# Patient Record
Sex: Male | Born: 1980 | Race: White | Hispanic: No | Smoking: Current every day smoker
Health system: Southern US, Community
[De-identification: ages and names within clinical notes are randomized; demographics above are authoritative.]

## PROBLEM LIST (undated history)

## (undated) DIAGNOSIS — K287 Chronic gastrojejunal ulcer without hemorrhage or perforation: Secondary | ICD-10-CM

## (undated) DIAGNOSIS — J45909 Unspecified asthma, uncomplicated: Secondary | ICD-10-CM

## (undated) DIAGNOSIS — K289 Gastrojejunal ulcer, unspecified as acute or chronic, without hemorrhage or perforation: Secondary | ICD-10-CM

## (undated) DIAGNOSIS — K219 Gastro-esophageal reflux disease without esophagitis: Secondary | ICD-10-CM

## (undated) DIAGNOSIS — F32A Depression, unspecified: Secondary | ICD-10-CM

## (undated) DIAGNOSIS — I8390 Asymptomatic varicose veins of unspecified lower extremity: Secondary | ICD-10-CM

## (undated) DIAGNOSIS — F319 Bipolar disorder, unspecified: Secondary | ICD-10-CM

## (undated) DIAGNOSIS — Z9884 Bariatric surgery status: Secondary | ICD-10-CM

## (undated) DIAGNOSIS — Z87442 Personal history of urinary calculi: Secondary | ICD-10-CM

## (undated) DIAGNOSIS — N200 Calculus of kidney: Secondary | ICD-10-CM

## (undated) DIAGNOSIS — F329 Major depressive disorder, single episode, unspecified: Secondary | ICD-10-CM

## (undated) DIAGNOSIS — I10 Essential (primary) hypertension: Secondary | ICD-10-CM

## (undated) DIAGNOSIS — D649 Anemia, unspecified: Secondary | ICD-10-CM

## (undated) DIAGNOSIS — K283 Acute gastrojejunal ulcer without hemorrhage or perforation: Secondary | ICD-10-CM

## (undated) DIAGNOSIS — T7840XA Allergy, unspecified, initial encounter: Secondary | ICD-10-CM

## (undated) HISTORY — DX: Gastrojejunal ulcer, unspecified as acute or chronic, without hemorrhage or perforation: K28.9

## (undated) HISTORY — DX: Essential (primary) hypertension: I10

## (undated) HISTORY — DX: Depression, unspecified: F32.A

## (undated) HISTORY — DX: Chronic gastrojejunal ulcer without hemorrhage or perforation: K28.7

## (undated) HISTORY — PX: TONSILLECTOMY: SUR1361

## (undated) HISTORY — DX: Bipolar disorder, unspecified: F31.9

## (undated) HISTORY — DX: Allergy, unspecified, initial encounter: T78.40XA

## (undated) HISTORY — DX: Unspecified asthma, uncomplicated: J45.909

## (undated) HISTORY — DX: Calculus of kidney: N20.0

## (undated) HISTORY — DX: Anemia, unspecified: D64.9

## (undated) HISTORY — DX: Asymptomatic varicose veins of unspecified lower extremity: I83.90

## (undated) HISTORY — DX: Acute gastrojejunal ulcer without hemorrhage or perforation: K28.3

## (undated) HISTORY — DX: Major depressive disorder, single episode, unspecified: F32.9

## (undated) HISTORY — PX: WISDOM TOOTH EXTRACTION: SHX21

## (undated) HISTORY — PX: OTHER SURGICAL HISTORY: SHX169

## (undated) HISTORY — DX: Gastro-esophageal reflux disease without esophagitis: K21.9

---

## 2002-01-29 ENCOUNTER — Encounter: Admission: RE | Admit: 2002-01-29 | Discharge: 2002-04-29 | Payer: Self-pay | Admitting: Unknown Physician Specialty

## 2004-12-04 ENCOUNTER — Ambulatory Visit: Payer: Self-pay | Admitting: Family Medicine

## 2004-12-18 ENCOUNTER — Ambulatory Visit: Payer: Self-pay | Admitting: Family Medicine

## 2004-12-20 ENCOUNTER — Ambulatory Visit: Payer: Self-pay | Admitting: Family Medicine

## 2005-05-27 ENCOUNTER — Ambulatory Visit: Payer: Self-pay | Admitting: Family Medicine

## 2005-11-26 ENCOUNTER — Ambulatory Visit: Payer: Self-pay | Admitting: Family Medicine

## 2005-12-19 ENCOUNTER — Ambulatory Visit: Payer: Self-pay | Admitting: Family Medicine

## 2006-01-03 ENCOUNTER — Ambulatory Visit: Payer: Self-pay | Admitting: Family Medicine

## 2006-04-14 ENCOUNTER — Ambulatory Visit: Payer: Self-pay | Admitting: Family Medicine

## 2006-10-01 ENCOUNTER — Ambulatory Visit: Payer: Self-pay | Admitting: Physician Assistant

## 2006-10-21 ENCOUNTER — Ambulatory Visit: Payer: Self-pay | Admitting: Family Medicine

## 2006-12-17 ENCOUNTER — Ambulatory Visit: Payer: Self-pay | Admitting: Family Medicine

## 2006-12-30 ENCOUNTER — Ambulatory Visit: Payer: Self-pay | Admitting: Family Medicine

## 2007-01-14 ENCOUNTER — Ambulatory Visit: Payer: Self-pay | Admitting: Family Medicine

## 2007-01-20 ENCOUNTER — Ambulatory Visit: Payer: Self-pay | Admitting: Family Medicine

## 2007-04-07 ENCOUNTER — Ambulatory Visit: Payer: Self-pay | Admitting: Family Medicine

## 2007-04-23 ENCOUNTER — Ambulatory Visit: Payer: Self-pay | Admitting: Family Medicine

## 2007-05-19 ENCOUNTER — Ambulatory Visit: Payer: Self-pay | Admitting: Family Medicine

## 2007-05-26 ENCOUNTER — Ambulatory Visit (HOSPITAL_COMMUNITY): Admission: RE | Admit: 2007-05-26 | Discharge: 2007-05-26 | Payer: Self-pay | Admitting: General Surgery

## 2007-05-26 DIAGNOSIS — K219 Gastro-esophageal reflux disease without esophagitis: Secondary | ICD-10-CM

## 2007-06-02 ENCOUNTER — Encounter: Admission: RE | Admit: 2007-06-02 | Discharge: 2007-06-02 | Payer: Self-pay | Admitting: *Deleted

## 2007-11-26 DIAGNOSIS — K289 Gastrojejunal ulcer, unspecified as acute or chronic, without hemorrhage or perforation: Secondary | ICD-10-CM

## 2007-11-26 HISTORY — DX: Gastrojejunal ulcer, unspecified as acute or chronic, without hemorrhage or perforation: K28.9

## 2007-11-26 HISTORY — PX: GASTRIC BYPASS: SHX52

## 2008-02-19 ENCOUNTER — Ambulatory Visit: Payer: Self-pay | Admitting: Internal Medicine

## 2008-02-19 DIAGNOSIS — I10 Essential (primary) hypertension: Secondary | ICD-10-CM | POA: Insufficient documentation

## 2008-03-21 ENCOUNTER — Encounter: Payer: Self-pay | Admitting: Internal Medicine

## 2008-03-21 ENCOUNTER — Ambulatory Visit (HOSPITAL_COMMUNITY): Admission: RE | Admit: 2008-03-21 | Discharge: 2008-03-21 | Payer: Self-pay | Admitting: Internal Medicine

## 2008-03-21 HISTORY — PX: ESOPHAGOGASTRODUODENOSCOPY: SHX1529

## 2008-03-24 ENCOUNTER — Ambulatory Visit: Payer: Self-pay | Admitting: Internal Medicine

## 2008-04-20 ENCOUNTER — Encounter: Admission: RE | Admit: 2008-04-20 | Discharge: 2008-04-20 | Payer: Self-pay | Admitting: General Surgery

## 2008-04-25 DIAGNOSIS — Z9884 Bariatric surgery status: Secondary | ICD-10-CM

## 2008-04-25 HISTORY — DX: Bariatric surgery status: Z98.84

## 2008-05-03 ENCOUNTER — Inpatient Hospital Stay (HOSPITAL_COMMUNITY): Admission: RE | Admit: 2008-05-03 | Discharge: 2008-05-05 | Payer: Self-pay | Admitting: General Surgery

## 2008-05-04 ENCOUNTER — Encounter (INDEPENDENT_AMBULATORY_CARE_PROVIDER_SITE_OTHER): Payer: Self-pay | Admitting: General Surgery

## 2008-05-04 ENCOUNTER — Ambulatory Visit: Payer: Self-pay | Admitting: *Deleted

## 2008-05-17 ENCOUNTER — Encounter: Admission: RE | Admit: 2008-05-17 | Discharge: 2008-05-17 | Payer: Self-pay | Admitting: General Surgery

## 2008-11-08 ENCOUNTER — Encounter: Payer: Self-pay | Admitting: Internal Medicine

## 2008-11-08 ENCOUNTER — Inpatient Hospital Stay (HOSPITAL_COMMUNITY): Admission: EM | Admit: 2008-11-08 | Discharge: 2008-11-08 | Payer: Self-pay | Admitting: General Surgery

## 2009-08-07 ENCOUNTER — Encounter: Admission: RE | Admit: 2009-08-07 | Discharge: 2009-08-07 | Payer: Self-pay | Admitting: Family Medicine

## 2011-01-07 ENCOUNTER — Ambulatory Visit (INDEPENDENT_AMBULATORY_CARE_PROVIDER_SITE_OTHER): Payer: BC Managed Care – PPO | Admitting: Internal Medicine

## 2011-01-07 ENCOUNTER — Encounter: Payer: Self-pay | Admitting: Internal Medicine

## 2011-01-07 DIAGNOSIS — F17200 Nicotine dependence, unspecified, uncomplicated: Secondary | ICD-10-CM

## 2011-01-07 DIAGNOSIS — E611 Iron deficiency: Secondary | ICD-10-CM

## 2011-01-07 DIAGNOSIS — F32A Depression, unspecified: Secondary | ICD-10-CM | POA: Insufficient documentation

## 2011-01-07 DIAGNOSIS — Z0000 Encounter for general adult medical examination without abnormal findings: Secondary | ICD-10-CM

## 2011-01-07 DIAGNOSIS — I10 Essential (primary) hypertension: Secondary | ICD-10-CM | POA: Insufficient documentation

## 2011-01-07 DIAGNOSIS — N200 Calculus of kidney: Secondary | ICD-10-CM | POA: Insufficient documentation

## 2011-01-07 DIAGNOSIS — K259 Gastric ulcer, unspecified as acute or chronic, without hemorrhage or perforation: Secondary | ICD-10-CM

## 2011-01-07 DIAGNOSIS — F329 Major depressive disorder, single episode, unspecified: Secondary | ICD-10-CM

## 2011-01-07 MED ORDER — TETANUS-DIPHTH-ACELL PERTUSSIS 5-2-15.5 LF-MCG/0.5 IM SUSP: 0.5000 mL | Freq: Once | INTRAMUSCULAR | Status: DC

## 2011-01-07 MED ORDER — OMEPRAZOLE 20 MG PO CPDR: 20.0000 mg | DELAYED_RELEASE_CAPSULE | Freq: Every day | ORAL | Status: DC

## 2011-01-07 MED ORDER — FLUOXETINE HCL 20 MG PO CAPS: 40.0000 mg | ORAL_CAPSULE | Freq: Every day | ORAL | Status: DC

## 2011-01-07 NOTE — Assessment & Plan Note (Signed)
Reported h/o PUD. Continue PPI daily. Requests GI followup and appt will be made.

## 2011-01-07 NOTE — Progress Notes (Signed)
  Subjective:    Patient ID: Tony Davila, male    DOB: 1981/06/29, 30 y.o.   MRN: [id number]  HPI Pt presents to clinic to establish primary medical care. H/o obesity now s/p bariatric surgery (wt 2009 366). Has documented fe deficiency taking fe supplement po bid. Reviewed outside labs 11/11 with nl cbc, chem12, lipid, tsh, b12. Ferritin low at 15. H/o HTN now off medication after bariatric surgery. Previously took ace inhibitor without difficulty. DBP minimally elevated but states outpt monitoring typically normotensive. States h/o PUD with gastric ulcer and needs GI followup. Takes daily PPI. +tobacco 1/2 ppd and feels can quit without medication aid.  H/o depression maintained on low dose ssri but feels intermittent depressed mood ~1/wk.   Reviewed PMH, PSH, medications, allergies, social hx and family hx.    Review of Systems  Constitutional: Negative for fever, chills and fatigue.  HENT: Positive for rhinorrhea. Negative for congestion and ear discharge.   Eyes: Negative for discharge and redness.  Respiratory: Negative for cough and shortness of breath.   Cardiovascular: Negative for chest pain and palpitations.  Gastrointestinal: Negative for abdominal pain, blood in stool and abdominal distention.  Genitourinary: Negative for frequency and hematuria.  Musculoskeletal: Positive for back pain. Negative for joint swelling.  Skin: Negative for color change and rash.  Neurological: Negative for dizziness, tremors and headaches.  Hematological: Negative for adenopathy. Does not bruise/bleed easily.  Psychiatric/Behavioral: Negative for suicidal ideas, behavioral problems and agitation.       Objective:   Physical Exam  Constitutional: He appears well-developed and well-nourished. No distress.  HENT:  Head: Normocephalic and atraumatic.  Right Ear: Tympanic membrane, external ear and ear canal normal.  Left Ear: Tympanic membrane, external ear and ear canal normal.  Nose: Nose  normal.  Mouth/Throat: Oropharynx is clear and moist. No oropharyngeal exudate.  Eyes: Conjunctivae are normal. Right eye exhibits no discharge. Left eye exhibits no discharge. No scleral icterus.  Neck: Neck supple. No JVD present.  Cardiovascular: Normal rate, regular rhythm and normal heart sounds.  Exam reveals no gallop and no friction rub.   No murmur heard. Pulmonary/Chest: Effort normal and breath sounds normal. No respiratory distress. He has no wheezes. He has no rales.  Abdominal: Soft. Bowel sounds are normal. He exhibits no distension and no mass. There is no hepatosplenomegaly. There is no tenderness.  Lymphadenopathy:    He has no cervical adenopathy.  Neurological: He is alert.  Skin: Skin is warm and dry. No rash noted. He is not diaphoretic. No erythema.  Psychiatric: He has a normal mood and affect. His behavior is normal.          Assessment & Plan:

## 2011-01-07 NOTE — Assessment & Plan Note (Signed)
Counseled regarding the need for cessation. States understanding and agreement. Not currently interested in medication to assist

## 2011-01-07 NOTE — Assessment & Plan Note (Signed)
Mildly suboptimal control. Increase prozac 40mg  qd. Followup closely if no improvement of sx.

## 2011-01-07 NOTE — Assessment & Plan Note (Signed)
Typically well controlled off medication since bariatric surgery. Monitor bp as outpt

## 2011-01-17 ENCOUNTER — Telehealth: Payer: Self-pay | Admitting: Internal Medicine

## 2011-01-17 NOTE — Telephone Encounter (Signed)
pls help. No documentation that i saw

## 2011-01-17 NOTE — Telephone Encounter (Signed)
Triage vm----checking on status of Prozac 40mg  to be sent to [facility] in [place]. Please return call. Was on 20mg .

## 2011-01-18 MED ORDER — FLUOXETINE HCL 40 MG PO CAPS: 40.0000 mg | ORAL_CAPSULE | Freq: Every day | ORAL | Status: DC

## 2011-01-18 NOTE — Progress Notes (Signed)
Addended by: Kern Reap on: 01/18/2011 05:05 PM   Modules accepted: Orders

## 2011-01-18 NOTE — Telephone Encounter (Signed)
Left message on machine for patient  That he has not been seen in the office, but he can schedule an appointment if he would like a refill.

## 2011-02-07 ENCOUNTER — Encounter: Payer: Self-pay | Admitting: Internal Medicine

## 2011-02-07 ENCOUNTER — Ambulatory Visit (INDEPENDENT_AMBULATORY_CARE_PROVIDER_SITE_OTHER): Payer: BC Managed Care – PPO | Admitting: Internal Medicine

## 2011-02-07 DIAGNOSIS — R1013 Epigastric pain: Secondary | ICD-10-CM

## 2011-02-07 DIAGNOSIS — Z9884 Bariatric surgery status: Secondary | ICD-10-CM

## 2011-02-07 DIAGNOSIS — G8929 Other chronic pain: Secondary | ICD-10-CM | POA: Insufficient documentation

## 2011-02-07 DIAGNOSIS — Z8711 Personal history of peptic ulcer disease: Secondary | ICD-10-CM

## 2011-02-07 DIAGNOSIS — K219 Gastro-esophageal reflux disease without esophagitis: Secondary | ICD-10-CM

## 2011-02-12 NOTE — Assessment & Plan Note (Signed)
Summary: FU ULCER.Tony Davila W PT//CX POL ADVISED    History of Present Illness Visit Type: new patient  Primary GI MD: Stan Head MD [facility] Primary Provider: Marye Round, MD  Requesting Provider: na Chief Complaint: GERD History of Present Illness:   30 yo wm s/p laparoscopic gastric bypass surgery iin 2009 (Dr. Johna Sheriff). In Dec 2009 he had abdoinal pain with bleeding and was found to have a jejunal ulcer (just past anastamosis).Pain and bleeding after surgery. Went on omeprazole and was well. Lost #200.   Then in past year 2 wpisodes of similar epigastric and LUQ  pain and hematemesis and hematochezia after one and 2 weeks ago pain but no bleeding. Was off omeprazole at first episode (8/11) but now back on since then.  Rare heartburn now.  Has not seen Dr. Johna Sheriff since 2010.   GI Review of Systems    Reports acid reflux and  heartburn.      Denies abdominal pain, belching, bloating, chest pain, dysphagia with liquids, dysphagia with solids, loss of appetite, nausea, vomiting, vomiting blood, weight loss, and  weight gain.        Denies anal fissure, black tarry stools, change in bowel habit, constipation, diarrhea, diverticulosis, fecal incontinence, heme positive stool, hemorrhoids, irritable bowel syndrome, jaundice, light color stool, liver problems, rectal bleeding, and  rectal pain.    Current Medications (verified): 1)  Omeprazole 20 Mg Cpdr (Omeprazole) .... One Capsule By Mouth Once Daily 2)  Prozac 40 Mg Caps (Fluoxetine Hcl) .... One Capsule By Mouth Once Daily 3)  Iron 325 (65 Fe) Mg Tabs (Ferrous Sulfate) .... One Tablet By Mouth Two Times A Day 4)  Vitamin B-12 1000 Mcg Tabs (Cyanocobalamin) .... One Tablet By Mouth Once Daily 5)  Calcium 500 Mg Tabs (Calcium) .... One Tablet By Mouth Once Daily 6)  Stool Softener 250 Mg Caps (Docusate Sodium) .... One Capsule By Mouth Once Daily 7)  Multi Vitamin Mens  Tabs (Multiple Vitamin) .... One Tablet By Mouth Once  Daily  Allergies (verified): No Known Drug Allergies  Past History:  Past Medical History: Morbid obesity  Hypertension Nephrolithiasis Gastroesophageal reflux disease Depression Sliding Hiatal Hernia jejunal ulcer (post-anastomotic)     Past Surgical History: Reviewed history from 02/04/2011 and no changes required. Tonsillectomy Exploratory laparoscopy Laparoscopic Roux-en-Y gastric bypass  Family History: Family History of Diabetes: Grandparents No FH of Colon Cancer:  Social History: [employer] Married Patient has never smoked.  Alcohol Use - no Illicit Drug Use - no  Review of Systems       All other ROS negative except as per HPI.   Vital Signs:  Patient profile:   30 year old male Height:      66 inches Weight:      186 pounds BMI:     30.13 BSA:     1.94 Pulse rate:   88 / minute Pulse rhythm:   regular BP sitting:   124 / 62  (left arm) Cuff size:   regular  Vitals Entered By: Ok Anis CMA (February 07, 2011 11:11 AM)  Physical Exam  General:  Well developed, well nourished, no acute distress. Eyes:  PERRLA, no icterus. Mouth:  No deformity or lesions, dentition normal. Neck:  Supple; no masses or thyromegaly. Lungs:  Clear throughout to auscultation. Heart:  Regular rate and rhythm; no murmurs, rubs,  or bruits. Abdomen:  some loose skin - mild soft and nontender without masses laparoscopic scars seen no herniae, no HSM Extremities:  no edema  Neurologic:  Alert and  oriented x3. Cervical Nodes:  No significant cervical or supraclavicular adenopathy.  Psych:  Alert and cooperative. Normal mood and affect.   Impression & Recommendations:  Problem # 1:  EPIGASTRIC PAIN (ICD-789.06) Assessment New recurrent problem for him. ? if recurrent ulcer. Gallstones also possible cause but on one occasion since initial problems in 2009 he had GI bleeding. he has recent labs and will fax (drawn by previous PCP - before [place]) Risks, benefits,and  indications of endoscopic procedure(s) were reviewed with the patient and all questions answered.  stay on PPI Orders: EGD (EGD)  Problem # 2:  PERSONAL HISTORY OF PEPTIC ULCER DISEASE (ICD-V12.71)  Orders: EGD (EGD)  Problem # 3:  GASTROESOPHAGEAL REFLUX DISEASE (ICD-530.81) Assessment: Unchanged Seems ok after bypass on PPI  Problem # 4:  BARIATRIC SURGERY STATUS (ICD-V45.86)  Patient Instructions: 1)  Upper Endoscopy brochure given.  2)  Copy sent to : Jaclynn Guarneri, MD 3)  The medication list was reviewed and reconciled.  All changed / newly prescribed medications were explained.  A complete medication list was provided to the patient / caregiver.

## 2011-02-12 NOTE — Letter (Signed)
Summary: EGD Instructions  [facility]  [address] [place], [place] [address]   Phone: [phone]  Fax: [phone]       CAMMERON GREIS    1981-10-11    MRN: [id number]       Procedure Day /Date: Monday March 26th, 2012     Arrival Time:  2:30pm     Procedure Time: 3:30pm     Location of Procedure:                    _ x _ [facility] (4th Floor)    PREPARATION FOR ENDOSCOPY   On 02/18/11 THE DAY OF THE PROCEDURE:  1.   No solid foods, milk or milk products are allowed after midnight the night before your procedure.  2.   Do not drink anything colored red or purple.  Avoid juices with pulp.  No orange juice.  3.  You may drink clear liquids until1:30pm, which is 2 hours before your procedure.                                                                                                CLEAR LIQUIDS INCLUDE: Water Jello Ice Popsicles Tea (sugar ok, no milk/cream) Powdered fruit flavored drinks Coffee (sugar ok, no milk/cream) Gatorade Juice: apple, white grape, white cranberry  Lemonade Clear bullion, consomm, broth Carbonated beverages (any kind) Strained chicken noodle soup Hard Candy   MEDICATION INSTRUCTIONS  Unless otherwise instructed, you should take regular prescription medications with a small sip of water as early as possible the morning of your procedure.         OTHER INSTRUCTIONS  You will need a responsible adult at least 28 years of age to accompany you and drive you home.   This person must remain in the waiting room during your procedure.  Wear loose fitting clothing that is easily removed.  Leave jewelry and other valuables at home.  However, you may wish to bring a book to read or an iPod/MP3 player to listen to music as you wait for your procedure to start.  Remove all body piercing jewelry and leave at home.  Total time from sign-in until discharge is approximately 2-3 hours.  You should go home  directly after your procedure and rest.  You can resume normal activities the day after your procedure.  The day of your procedure you should not:   Drive   Make legal decisions   Operate machinery   Drink alcohol   Return to work  You will receive specific instructions about eating, activities and medications before you leave.    The above instructions have been reviewed and explained to me by   _______________________    I fully understand and can verbalize these instructions _____________________________ Date _________

## 2011-02-12 NOTE — Op Note (Signed)
NAMEKARLOS, SCADDEN               ACCOUNT NO.:  [id number]      MEDICAL RECORD NO.:  [id number]          PATIENT TYPE:  INP      LOCATION:  2550                         FACILITY:  [facility]      PHYSICIAN:  Sandria Bales. Ezzard Standing, M.D.  DATE OF BIRTH:  1981-09-24      DATE OF PROCEDURE:  11/08/2008   DATE OF DISCHARGE:                                  OPERATIVE REPORT      Date of surgery ?      PREOPERATIVE DIAGNOSES:   1. Abdominal pain.   2. Hematemesis.   3. Nausea, vomiting      POSTOPERATIVE DIAGNOSIS:   1. Normal post-gastric Roux-en-Y bypass anatomy laparoscopically.  No       obvious internal hernia.   2. 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      PROCEDURES:   1. Exploratory laparoscopy.   2. Esophagogastrojejunoscopy.      SURGEON:  Sandria Bales. Ezzard Standing, MD      FIRST ASSISTANT:  None.      ANESTHESIA:  General endotracheal.      ESTIMATED BLOOD LOSS:  Minimal.      INDICATIONS FOR PROCEDURE:  Mr. Shonk is a 30 year old white male who   had a laparoscopic Roux-en-Y gastric bypass for morbid obesity by Dr.   Jaclynn Guarneri in June 2009.  He has done well, has lost close to 130   pounds.  Unfortunately, he had quit smoking for surgery, but restarted   smoking due to stress.      He presented acutely with nausea, vomiting, and blood in his vomitus.   He has some left upper quadrant pain and a mildly elevated WBC.  I   discussed with him the possibility of 2 primary concerns would be an   internal hernia versus ulcer of his pouch.  We will plan diagnostic   laparoscopy and EGD in OR.  I discussed the indications and potential   complications with the patient.      OPERATIVE NOTE:  The patient was in supine position given a general   anesthesia.  His abdomen was prepped with Techni-Care and sterilely   draped.  A time-out was held identifying the patient and the procedure.      I accessed abdominal cavity through the left upper quadrant with a 10-mm   Optiview trocar.   I then used 3 5mm trocars.  One to the right lower   quadrant, one to  left lower quadrant, and one to the left upper   quadrant, and did an exploration.  I ran the small bowel from the   terminal ileum back to his ligament of Treitz.  I encountered his   jejunojejunostomy, found no evidence of internal hernia either at the   jejunal site or at the [place] defect.  The Vonita Moss defect appeared to   be closed.      He did have some blue discoloration of his bowel consistent with   possible blood in the bowel.  His right lobe and left lobe of the liver  looked good.  His gallbladder looked good.  There was no other intra-   abdominal explanation for his abdominal pain, though he did have some   fullness up around his pouch.  With that means, I am not very sure, I   could see no obvious external evidence of an ulcer, however.      I then endoscoped the patient using a Pentax flexible endoscope and   passed this down to his gastric pouch without difficulty and encountered   blood in the esophagus.  He had some old clots in his pouch and   esophagus, but I saw no obvious ulcers in his pouch.  His anastomosis   was seen patent at about 2-cm diameter; but however, upon entering the   pouch, I did see what looked like an ulcer on the jejunal side just   beyond the gastrojejunal anastomosis.  There was no active bleeding.  I   did take photos of this and placed in the chart.  I tried to irrigate   this in order to get a better look at it, but I did not wanted to   disturb it if it had already clotted off.      I then re-scrubbed, took the trocars out of his abdomen, closed the skin   with staples.  The patient tolerated the procedure well, was transported   to recovery room in good condition.  Sponge and needle count were   correct at the end of the case.               Sandria Bales. Ezzard Standing, M.D.   Electronically Signed

## 2011-02-15 ENCOUNTER — Encounter: Payer: Self-pay | Admitting: Internal Medicine

## 2011-02-18 ENCOUNTER — Encounter: Payer: Self-pay | Admitting: Internal Medicine

## 2011-02-18 ENCOUNTER — Ambulatory Visit (AMBULATORY_SURGERY_CENTER): Payer: BC Managed Care – PPO | Admitting: Internal Medicine

## 2011-02-18 VITALS — BP 127/86 | HR 62 | Temp 97.6°F | Resp 24 | Ht 67.0 in | Wt 175.0 lb

## 2011-02-18 DIAGNOSIS — Z8711 Personal history of peptic ulcer disease: Secondary | ICD-10-CM

## 2011-02-18 DIAGNOSIS — R1013 Epigastric pain: Secondary | ICD-10-CM

## 2011-02-18 DIAGNOSIS — K219 Gastro-esophageal reflux disease without esophagitis: Secondary | ICD-10-CM

## 2011-02-18 MED ORDER — OMEPRAZOLE 40 MG PO CPDR: 20.0000 mg | DELAYED_RELEASE_CAPSULE | Freq: Every day | ORAL | Status: DC

## 2011-02-18 NOTE — Patient Instructions (Signed)
The EGD was normal. No clear cause of your symptoms but it could be you need a stronger dose of omeprazole. So, I want you to take 40 mg omeprazole daily instead of 20 mg daily as written in medications. If this does not work out after 1 month then call Dr. Leone Payor back and tell him.

## 2011-02-19 ENCOUNTER — Telehealth: Payer: Self-pay | Admitting: *Deleted

## 2011-02-19 ENCOUNTER — Encounter: Payer: Self-pay | Admitting: Internal Medicine

## 2011-02-19 HISTORY — PX: UPPER GASTROINTESTINAL ENDOSCOPY: SHX188

## 2011-02-19 NOTE — Telephone Encounter (Signed)
Per Dr Leone Payor, faxed RETURN TO WORK note to [phone], ATTN: Josh or [place].

## 2011-02-19 NOTE — Telephone Encounter (Signed)
No ID on answering machine, no message left 

## 2011-02-19 NOTE — Telephone Encounter (Signed)
Please fax letter that I have given you

## 2011-02-19 NOTE — Telephone Encounter (Signed)
Follow up Call- Patient questions:  Do you have a fever, pain , or abdominal swelling? yes Pain Score  4 *  Have you tolerated food without any problems? yes  Have you been able to return to your normal activities? no  Do you have any questions about your discharge instructions: Diet   no Medications  no Follow up visit  no  Do you have questions or concerns about your Care? no  Actions: * If pain score is 4 or above: Physician/ provider Notified : Stan Head, MD   Pt complains of throat soreness. Pt states that he is still extremely drowsy and feels that he will not be able to return to his normal activities. Pt requests that a note be faxed to his work stating that he is unable to work due to inability to return to normal activities. Pt work fax number is [phone]

## 2011-02-26 NOTE — Procedures (Signed)
Summary: Upper Endoscopy  Patient: [name] Note: All result statuses are Final unless otherwise noted.  Tests: (1) Upper Endoscopy (EGD)   EGD Upper Endoscopy       DONE     [facility]     [address]     [place], [place]  [address]          ENDOSCOPY PROCEDURE REPORT          PATIENT:  Tony Davila, Tony Davila  MR#:  [id number]     BIRTHDATE:  November 17, 1981, 30 yrs. old  GENDER:  male          ENDOSCOPIST:  Iva Boop, MD, [facility]     Referred by:  Charlynn Court, M.D.          PROCEDURE DATE:  02/18/2011     PROCEDURE:  EGD, diagnostic 43235     ASA CLASS:  Class I     INDICATIONS:  epigastric pain s/p laparoscopic gastric bypas, had     jejeunal (anastomotic) ulcer 112/09.     Now with recurrent pain, and some history of hematemesis months     ago.          MEDICATIONS:   Fentanyl 75 mcg IV, Versed 8 mg IV     TOPICAL ANESTHETIC:  Exactacain Spray          DESCRIPTION OF PROCEDURE:   After the risks benefits and     alternatives of the procedure were thoroughly explained, informed     consent was obtained.  The LB [id number]-H180 [id number] endoscope was     introduced through the mouth and advanced to the proximal jejunum,     without limitations.  The instrument was slowly withdrawn as the     mucosa was fully examined.     <<PROCEDUREIMAGES>>          There was normal post-op  (s/p gastric bypass) examination.Normal     esophagus with z-line at 35 cm. Gastric pouch and anastomosis as     well as proximal jejunum were normal.    Retroflexed views     revealed not done- not possible.    The scope was then withdrawn     from the patient and the procedure completed.          COMPLICATIONS:  None          ENDOSCOPIC IMPRESSION:     1) Normal post-op examination, s/p gastric bypass     2) cause of symptoms not identified but could be reflux     RECOMMENDATIONS:     Increase omeprazole to 40 mhg daily. If not helpful after 1     month will reassess       REPEAT EXAM:  In for as needed.          Iva Boop, MD, [facility]          CC:  Rosalyn Gess, MD     Glenna Fellows, MD     The Patient          n.     eSIGNED:   Iva Boop at 02/18/2011 04:01 PM          Hessie Dibble, [phone]  Note: An exclamation mark (!) indicates a result that was not dispersed into the flowsheet. Document Creation Date: 02/18/2011 4:01 PM _______________________________________________________________________  (1) Order result status: Final Collection or observation date-time: 02/18/2011 15:45 Requested date-time:  Receipt date-time:  Reported date-time:  Referring Physician:  Ordering Physician: Stan Head [phone]) Specimen Source:  Source: Launa Grill Order Number: [id number] Lab site:

## 2011-04-09 NOTE — Assessment & Plan Note (Signed)
[facility]                         GASTROENTEROLOGY OFFICE NOTE   NAME:Tony Davila, Tony Davila                        MRN:          [id number]  DATE:02/19/2008                            DOB:          [birth year]    REFERRING PHYSICIAN:  Sharlet Salina T. Hoxworth, M.D.   REASON FOR CONSULTATION:  Reflux.  Planning for bariatric surgery.   ASSESSMENT:  A 30 year old white man, who has had chronic heartburn  problems for 13 years.  He has fair to good, but not complete control on  omeprazole 20 mg daily.  He is planning for bariatric surgery and this  is part of preoperative workup, as well.   PLAN:  1. Schedule upper GI endoscopy to screen for Barrett's esophagus or      other complications of reflux disease.  2. Increase omeprazole to 40 mg daily.  He was previously on Nexium,      but it was too costly.   Risks, benefits, and indications of upper GI endoscopy were explained  and he understands and agrees to proceed.   HISTORY:  This is a 30 year old white man with morbid obesity, who has  had heartburn and indigestion problems for 13 years.  There is no  dysphagia, vomiting or reflux.  He does get some postprandial gas and  bloating and upper abdominal pain.  Nexium controlled his heartburn  pretty well.  He was then changed to omeprazole 20 mg daily because of  cost reasons, but that is not quite so effective.  He does have break-  through symptoms, not every day, but he will at times, though his wife  thinks it is when he eats spicy foods and refluxogenic foods.  He can  have them just a few hours after taking his omeprazole or later in the  day, so it is not a constant pattern of symptoms after 8-12 hours past  omeprazole ingestion.  He has intentionally lost 15 pounds, trying to  get his BMI down prior to surgery.   PAST MEDICAL HISTORY:  1. Morbid obesity.  2. Hypertension.  3. Nephrolithiasis.  4. Gastroesophageal reflux disease.  5. Some history  of depression.   FAMILY HISTORY:  Diabetes in grandparents.   SOCIAL HISTORY:  He is married.  He lives with his wife and his  grandmother.  He works with [employer], some college education.  No  alcohol, tobacco or drugs.   REVIEW OF SYSTEMS:  Otherwise completely negative.  See medical history  form.   PHYSICAL EXAM:  Well-developed, well-nourished, morbidly obese white  man.  Height is 5 feet 6, weight 366 pounds, blood pressure 120/84, pulse 64,  regular.  EYES:  Anicteric.  ENT:  Normal mouth and posterior pharynx.  NECK:  Supple, no thyromegaly or mass.  CHEST:  Clear.  HEART:  S1, S2, no murmurs or gallops.  ABDOMEN:  Obese, soft and nontender, no organomegaly or mass detected.  LYMPHATIC:  No neck or supraclavicular nodes.  EXTREMITIES:  Trace peripheral edema bilaterally, lower extremities.  There is no cyanosis or clubbing noted.  SKIN:  Warm, dry, no acute rash.  CRANIAL NERVES:  Cranial nerves II through XII intact.  PSYCHIATRIC:  Appropriate affect and mood.   I have reviewed Dr. Jamse Mead office notes.  The patient has also had  an upper GI that has shown reflux.  CBC, CMET normal, though he had a  low glucose, actually, of 41, back in 2008.  Triglycerides 152,  cholesterol 176.  His TSH was normal.  The upper GI series was in July  of 2008.  Other than the reflux, no abnormalities.  Negative abdominal  ultrasound, as well.   Not mentioned above:  MEDICATIONS:  1. Lisinopril 20 mg daily.  2. Omeprazole 20 mg daily.  3. Zyrtec daily.   No known drug allergies.     Iva Boop, MD,FACG  Electronically Signed    CEG/MedQ  DD: 02/19/2008  DT: 02/19/2008  Job #: [id number]   cc:   Lorne Skeens. Hoxworth, M.D.  Delaney Meigs, M.D.

## 2011-04-09 NOTE — H&P (Signed)
NAMECHIRSTOPHER, IOVINO               ACCOUNT NO.:  [id number]   MEDICAL RECORD NO.:  [id number]          PATIENT TYPE:  INP   LOCATION:  5123                         FACILITY:  [facility]   PHYSICIAN:  Sandria Bales. Ezzard Standing, M.D.  DATE OF BIRTH:  04/12/1981   DATE OF ADMISSION:  11/08/2008  DATE OF DISCHARGE:  11/08/2008                              HISTORY & PHYSICAL   Date of surgery ?   HISTORY OF ILLNESS:  Mr. Bushnell is a 30 year old white male who is  morbidly obese.  He had reached a BMI as high as 60.  However, he  underwent a laparoscopic Roux-en-Y gastric bypass by Dr. Glenna Fellows on May 03, 2008, and he has gone from, he says, he thinks  approximately from about 370 pounds down to about 240 pounds over the  last 6 months.   He has done extremely well.  He has had no nausea, vomiting, or  abdominal complaints.  He acutely tonight had developed nausea, he  vomited what he said was blood with left upper quadrant abdominal pain.   He went to the [facility] Emergency Room and I was contacted by  Dr. Heloise Ochoa, who is an ER physician  there, who told me his  symptoms.  I thought he would be best transferred to [facility].   Since transfer, he has had no more nausea or vomiting.  He still has  left upper quadrant pain, though not severe.  By his history, he had  quit smoking cigarettes for the surgery, but because he had been laid  off for his job, he restarted smoking cigarettes.  He has been smoking  1/2 to 3/4th of a pack a day.   He preoperatively has some gastroesophageal reflux, this resolved with  surgery.  He has had no problem vomiting of blood.   PAST MEDICAL HISTORY:  He has no allergies.  Oral medication includes  Prozac.   REVIEW OF SYSTEMS:  NEUROLOGIC:  No seizure or loss of consciousness.  He saw maybe a psychiatrist when he was a teenager for his depression,  but he is not seeing any doctor now who prescribes his Prozac now.  PULMONARY:  He smokes  again 3/4th of a pack of cigarettes.  CARDIAC:  No history of heart disease, chest pain, or hypertension.  GASTROINTESTINAL:  He saw Dr. Johna Sheriff who took his gallbladder out, but  at least by my review as I know he did not have his gallbladder removed.  He has had no liver disease, pancreatic disease, or colon disease.  UROLOGIC:  No kidney stones or kidney infections.   He does work again for [employer].  He is married, but his wife has returned  home; he is going to call her and have her come back down here.   PHYSICAL EXAMINATION:  VITAL SIGNS:  His pulse is 67, his blood pressure  is 118/70.  GENERAL:  He is a well-nourished, still overweight white male, alert and  cooperative on physical exam.  HEENT:  Unremarkable.  NECK:  Supple without masses or thyromegaly.  LUNGS:  Clear to  auscultation.  HEART:  Regular rate and rhythm without murmur or rub.  ABDOMEN:  Active bowel sounds.  He had a little bit of fullness in left  upper quadrant with some tenderness with soreness on that side.  He has  had no real guarding, no rebound.  He has the scars from his prior  laparoscopic surgery.  EXTREMITIES:  Good strength in upper and lower extremities.  NEUROLOGIC:  Grossly intact.   His labs are from [place], are reviewed, show albumin of 3.6, alk phos  of 103, amylase of 37, and creatinine of 0.5.  Hematocrit of 38, white blood count 13,200 with 72% neutrophils.  His urinalysis was negative.   IMPRESSION:  1. Acute onset of nausea, vomiting, hematemesis, left upper quadrant      abdominal pain.  I thought really the differential would be 2 things:  I.  He could have  ulcer disease, precipitated smoking.  1. He could have an internal hernia.  He is comfortable now, but still      sore in his left upper quadrant, but I think it is best to      laparoscope him.  He has no evidence of  internal hernia on      endoscopy if he has an internal hernia repair of that, and defer      endoscopy  possibly to later.   Potential complications include bleeding, which he has already had with  him vomiting blood, poor function of the bowel, open surgery, bowel  resection.   We will plan to do a surgery in the next hour or so.   1. Depression, on Prozac.  2. Smokes cigarettes.  I told him this  is not a good idea with bypass      surgery whether he has an ulcer now or not, and I will let Dr.      Johna Sheriff address that to him also.      Sandria Bales. Ezzard Standing, M.D.  Electronically Signed     DHN/MEDQ  D:  11/08/2008  T:  11/08/2008  Job:  [id number]   cc:   Lorne Skeens. Hoxworth, M.D.  Delaney Meigs, M.D.

## 2011-04-09 NOTE — Op Note (Signed)
NAMELATAVIOUS, BITTER               ACCOUNT NO.:  [id number]   MEDICAL RECORD NO.:  [id number]          PATIENT TYPE:  INP   LOCATION:  2550                         FACILITY:  [facility]   PHYSICIAN:  Sandria Bales. Ezzard Standing, M.D.  DATE OF BIRTH:  September 21, 1981   DATE OF PROCEDURE:  11/08/2008  DATE OF DISCHARGE:                               OPERATIVE REPORT   Date of surgery ?   PREOPERATIVE DIAGNOSES:  1. Abdominal pain.  2. Hematemesis.  3. Nausea, vomiting   POSTOPERATIVE DIAGNOSIS:  1. Normal post-gastric Roux-en-Y bypass anatomy laparoscopically.  No      obvious internal hernia.  2. 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   PROCEDURES:  1. Exploratory laparoscopy.  2. Esophagogastrojejunoscopy.   SURGEON:  Sandria Bales. Ezzard Standing, MD   FIRST ASSISTANT:  None.   ANESTHESIA:  General endotracheal.   ESTIMATED BLOOD LOSS:  Minimal.   INDICATIONS FOR PROCEDURE:  Mr. Tony Davila is a 30 year old white male who  had a laparoscopic Roux-en-Y gastric bypass for morbid obesity by Dr.  Jaclynn Guarneri in June 2009.  He has done well, has lost close to 130  pounds.  Unfortunately, he had quit smoking for surgery, but restarted  smoking due to stress.   He presented acutely with nausea, vomiting, and blood in his vomitus.  He has some left upper quadrant pain and a mildly elevated WBC.  I  discussed with him the possibility of 2 primary concerns would be an  internal hernia versus ulcer of his pouch.  We will plan diagnostic  laparoscopy and EGD in OR.  I discussed the indications and potential  complications with the patient.   OPERATIVE NOTE:  The patient was in supine position given a general  anesthesia.  His abdomen was prepped with Techni-Care and sterilely  draped.  A time-out was held identifying the patient and the procedure.   I accessed abdominal cavity through the left upper quadrant with a 10-mm  Optiview trocar.  I then used 3 5mm trocars.  One to the right lower  quadrant, one to   left lower quadrant, and one to the left upper  quadrant, and did an exploration.  I ran the small bowel from the  terminal ileum back to his ligament of Treitz.  I encountered his  jejunojejunostomy, found no evidence of internal hernia either at the  jejunal site or at the [place] defect.  The Vonita Moss defect appeared to  be closed.   He did have some blue discoloration of his bowel consistent with  possible blood in the bowel.  His right lobe and left lobe of the liver  looked good.  His gallbladder looked good.  There was no other intra-  abdominal explanation for his abdominal pain, though he did have some  fullness up around his pouch.  With that means, I am not very sure, I  could see no obvious external evidence of an ulcer, however.   I then endoscoped the patient using a Pentax flexible endoscope and  passed this down to his gastric pouch without difficulty and encountered  blood in the esophagus.  He had some old clots in his pouch and  esophagus, but I saw no obvious ulcers in his pouch.  His anastomosis  was seen patent at about 2-cm diameter; but however, upon entering the  pouch, I did see what looked like an ulcer on the jejunal side just  beyond the gastrojejunal anastomosis.  There was no active bleeding.  I  did take photos of this and placed in the chart.  I tried to irrigate  this in order to get a better look at it, but I did not wanted to  disturb it if it had already clotted off.   I then re-scrubbed, took the trocars out of his abdomen, closed the skin  with staples.  The patient tolerated the procedure well, was transported  to recovery room in good condition.  Sponge and needle count were  correct at the end of the case.      Sandria Bales. Ezzard Standing, M.D.  Electronically Signed     DHN/MEDQ  D:  11/08/2008  T:  11/08/2008  Job:  [id number]   cc:   Lorne Skeens. Hoxworth, M.D.

## 2011-04-09 NOTE — Op Note (Signed)
NAMEMACKENZIE, LIA               ACCOUNT NO.:  [id number]   MEDICAL RECORD NO.:  [id number]          PATIENT TYPE:  INP   LOCATION:  0001                         [facility]ACILITY:  [facility]   PHYSICIAN:  Sharlet Salina T. Hoxworth, M.D.DATE OF BIRTH:  08-10-1981   DATE OF PROCEDURE:  05/03/2008  DATE OF DISCHARGE:                               OPERATIVE REPORT   PREOPERATIVE DIAGNOSIS:  Morbid obesity.   POSTOPERATIVE DIAGNOSIS:  Morbid obesity.   SURGICAL PROCEDURE:  Laparoscopic Roux-en-Y gastric bypass.   SURGEON:  Lorne Skeens. Hoxworth, M.D.   ASSISTANT:  Alfonse Ras, MD.   ANESTHESIA:  General.   BRIEF HISTORY:  Huntington Leverich is a 30 year old male with progressive  morbid obesity unresponsive to medical management.  He has several  comorbidities and he has reached a BMI of 60.  After extensive  preoperative workup and discussion detailed elsewhere, we have elected  to proceed with laparoscopic Roux-en-Y gastric bypass for treatment of  his obesity.   DESCRIPTION OF OPERATION:  The patient brought to the operating room and  placed in supine position on the operating table and general  endotracheal anesthesia was induced.  The abdomen was widely sterilely  prepped and draped.  Foley catheter was placed.  The correct patient and  procedure were verified.  He had received broad-spectrum IV antibiotics  preoperatively.  Lovenox 40 mg had been administered subcutaneously.  PAS were placed.  Local anesthesia was used to infiltrate the trocar  sites prior to the incisions.  Access was obtained with an 11 mm OptiVu  trocar in the left subcostal space and pneumoperitoneum established  without difficulty.  Under direct vision, a 12 mm trocar was placed  laterally in the right upper quadrant, another 12 mm trocar in the right  subcostal space, an 11 mm trocar for the camera port just to the left of  the umbilicus and another 5 mm trocar in the left flank.  The omentum  was elevated, the  ligament of Treitz identified and a 40 cm  biliopancreatic limb measured.  The small bowel was divided at this  point with a single firing of the white load 45 mm stapler.  The  mesentery was further divided a short distance with the Harmonic  scalpel.  The end of the Roux limb was marked by suturing a Penrose  drain to it and then a 100 cm Roux limb was measured.  At this point, a  side-to-side anastomosis was formed between the biliopancreatic limb and  the Roux limb creating enterotomies with the Harmonic scalpel and  creating the anastomosis with single firing of the white load Endo-GIA  stapler.  The staple line was intact and without bleeding.  The common  enterotomy was then closed with running 2-0 Vicryl begun at either end  of the enterotomy and tied centrally.  The mesenteric defect at the  jejunojejunostomy was closed with a running 2-0 silk and Tisseel tissue  sealant was used on the staple and suture lines.  Following this, the  omentum was divided with the Harmonic scalpel in preparation for freeing  the Roux limb  up.  The left lobe of the liver was elevated with the  [facility] retractor with excellent exposure of the stomach.  The angle  of Hiss was dissected and mobilized with the Harmonic scalpel.  A point  along the lesser curve between 4-5 cm from the EG junction was chosen  and the peritoneum was incised here and dissection carried back along  right angles to the stomach, to the retrogastric area and the free  lesser sac entered.  The initial firing with a 60 mm gold stapler at  right angles to the stomach was performed.  A small tubular gastric  pouch was then created with two further firings of blue load 60 mm  stapler up through the previously dissected area of the angle of Hiss  which was completely divided.  The gastric remnant was oversewn along  the staple line with a running 2-0 silk.  The Roux limb was brought to  the gastric pouch without any excessive tension  and an anastomosis was  created initially with a running posterior row of 2-0 Vicryl.  Enterotomies were created in the pouch and the Roux limb with the  Harmonic scalpel and a 2 cm anastomosis with the linear blue load  stapler was created without difficulty.  This staple line again was  inspected and was intact without bleeding.  The common enterotomy was  closed from either end with running 2-0 Vicryl.  The Ewald tube was then  passed down through the anastomosis and an anterior inverting suture  line of 2-0 Vicryl was placed.  Following this, Peterson's defect was  exposed and was closed between the mesentery of the transverse colon and  the transverse colon to the edge of the mesentery of the Roux limb.  This was done with 2-0 silk.  At this point, with the outlet of the  gastrojejunostomy clamped and anastomosis under saline, Dr. Colin Benton  performed endoscopy with the small gastric pouch tensely distended with  air, there was no evidence of leak.  There was desufflated.  The abdomen  was thoroughly irrigated and inspected for hemostasis or any evidence of  trocar injury and everything looked fine.  The [place]athanson retractor was  removed under direct vision.  All CO2 evacuated.  Trocars removed.  Skin  incisions were closed with staples.  Sponge, needle and instrument  counts were correct.  The patient was taken to recovery in good  condition.      Lorne Skeens. Hoxworth, M.D.  Electronically Signed     BTH/MEDQ  D:  05/03/2008  T:  05/03/2008  Job:  [id number]

## 2011-04-30 ENCOUNTER — Ambulatory Visit (INDEPENDENT_AMBULATORY_CARE_PROVIDER_SITE_OTHER): Payer: BC Managed Care – PPO | Admitting: Internal Medicine

## 2011-04-30 ENCOUNTER — Ambulatory Visit (INDEPENDENT_AMBULATORY_CARE_PROVIDER_SITE_OTHER)
Admission: RE | Admit: 2011-04-30 | Discharge: 2011-04-30 | Disposition: A | Discharge status: Home / Self Care | Payer: BC Managed Care – PPO | Source: Ambulatory Visit | Attending: Internal Medicine | Admitting: Internal Medicine

## 2011-04-30 ENCOUNTER — Telehealth: Payer: Self-pay

## 2011-04-30 ENCOUNTER — Encounter: Payer: Self-pay | Admitting: Internal Medicine

## 2011-04-30 DIAGNOSIS — R109 Unspecified abdominal pain: Secondary | ICD-10-CM

## 2011-04-30 DIAGNOSIS — R51 Headache: Secondary | ICD-10-CM | POA: Insufficient documentation

## 2011-04-30 DIAGNOSIS — R519 Headache, unspecified: Secondary | ICD-10-CM | POA: Insufficient documentation

## 2011-04-30 DIAGNOSIS — R1032 Left lower quadrant pain: Secondary | ICD-10-CM

## 2011-04-30 LAB — CBC WITH DIFFERENTIAL/PLATELET
Basophils Absolute: 0 10*3/uL (ref 0.0–0.1)
Eosinophils Relative: 2 % (ref 0.0–5.0)
HCT: 44.9 % (ref 39.0–52.0)
Lymphocytes Relative: 48.8 % — ABNORMAL HIGH (ref 12.0–46.0)
Monocytes Relative: 9.2 % (ref 3.0–12.0)
Neutrophils Relative %: 39.3 % — ABNORMAL LOW (ref 43.0–77.0)
Platelets: 223 10*3/uL (ref 150.0–400.0)
WBC: 5.1 10*3/uL (ref 4.5–10.5)

## 2011-04-30 LAB — POCT URINALYSIS DIPSTICK
Glucose, UA: NEGATIVE
Leukocytes, UA: NEGATIVE
Nitrite, UA: NEGATIVE

## 2011-04-30 MED ORDER — KETOROLAC TROMETHAMINE 30 MG/ML IJ SOLN
30.0000 mg | Freq: Once | INTRAMUSCULAR | Status: AC
  Administered 2011-04-30: 30 mg via INTRAMUSCULAR

## 2011-04-30 MED ORDER — ISOMETHEPTENE-APAP-DICHLORAL 65-325-100 MG PO CAPS: 1.0000 | ORAL_CAPSULE | ORAL | Status: DC | PRN

## 2011-04-30 MED ORDER — LEVOFLOXACIN 500 MG PO TABS: 500.0000 mg | ORAL_TABLET | Freq: Every day | ORAL | Status: AC

## 2011-04-30 NOTE — Assessment & Plan Note (Signed)
Neurologically nonfocal without red flag sx's. Toradol IM injection given today. Begin midrin prn. Close followup as above

## 2011-04-30 NOTE — Assessment & Plan Note (Signed)
Obtain CBC, UA and AAS. Empiric trial of levaquin. Close followup in one week or sooner if necessary.

## 2011-04-30 NOTE — Progress Notes (Signed)
  Subjective:    Patient ID: Tony Davila, male    DOB: 12-Oct-1981, 30 y.o.   MRN: [id number]  HPI Pt presents to clinic for evaluation of headaches and abdominal pain. Notes 4d h/o LLQ pain intermittent and without radiation. Pain can be severe and last up to an hour. Worsened with movement and is tender to touch. Denies f/c, changes in bowel habits or blood in stool. No change in symptoms with food. No other alleviating or exacerbating factors.  Notes 3-4 wk h/o bitemporal and frontal headaches intermittently. May last several days without resolution. Today HA is 7/10 on pain scale. No obvious triggers and notes possible photophobia without nausea/vomitting. Denies neurologic deficits including numbness, tingling, extremity weakness, difficultly with speech. No family hx of migraines or chronic headaches in general.   No other complaints.  Reviewed pmh, medications and allergies    Review of Systems see hpi     Objective:   Physical Exam  [nursing notereviewed. Constitutional: He appears well-developed and well-nourished. No distress.  HENT:  Head: Normocephalic and atraumatic.  Right Ear: External ear normal.  Left Ear: External ear normal.  Eyes: Conjunctivae and EOM are normal. Pupils are equal, round, and reactive to light. Right eye exhibits no discharge. Left eye exhibits no discharge.  Neck: Neck supple.  Abdominal: Soft. Bowel sounds are normal. He exhibits no distension and no mass. There is no hepatosplenomegaly. There is tenderness in the left lower quadrant. There is no rigidity, no rebound and no guarding. No hernia.  Neurological: He is alert. He has normal strength. No cranial nerve deficit. Coordination normal.  Skin: Skin is warm and dry. He is not diaphoretic.  Psychiatric: He has a normal mood and affect.          Assessment & Plan:

## 2011-04-30 NOTE — Telephone Encounter (Signed)
Left message for pt to call back  °

## 2011-04-30 NOTE — Telephone Encounter (Signed)
Message copied by Beverely Low on Tue Apr 30, 2011  4:43 PM ------      Message from: Staci Righter      Created: Tue Apr 30, 2011  4:29 PM       Xray nl

## 2011-05-01 ENCOUNTER — Other Ambulatory Visit: Payer: Self-pay

## 2011-05-01 ENCOUNTER — Telehealth: Payer: Self-pay | Admitting: *Deleted

## 2011-05-01 MED ORDER — BUTALBITAL-APAP-CAFFEINE 50-325-40 MG PO TABS: 1.0000 | ORAL_TABLET | Freq: Two times a day (BID) | ORAL | Status: AC | PRN

## 2011-05-01 NOTE — Telephone Encounter (Signed)
Grandmother said pt is at work.

## 2011-05-01 NOTE — Telephone Encounter (Signed)
Pt wants lab and xray results, please.

## 2011-05-01 NOTE — Telephone Encounter (Signed)
I have attempted to call pt several times on home number and work number. The work number is a number that is no longer in service.

## 2011-05-01 NOTE — Telephone Encounter (Signed)
Try [phone]. That is the number he left.

## 2011-05-02 ENCOUNTER — Telehealth: Payer: Self-pay | Admitting: Internal Medicine

## 2011-05-02 ENCOUNTER — Telehealth: Payer: Self-pay

## 2011-05-02 NOTE — Telephone Encounter (Signed)
Mailed to pt

## 2011-05-02 NOTE — Telephone Encounter (Signed)
Message copied by Beverely Low on Thu May 02, 2011  5:12 PM ------      Message from: Staci Righter      Created: Tue Apr 30, 2011  4:29 PM       Xray nl

## 2011-05-02 NOTE — Telephone Encounter (Signed)
Message copied by Beverely Low on Thu May 02, 2011  5:12 PM ------      Message from: Staci Righter      Created: Tue Apr 30, 2011  4:55 PM       Labs ok

## 2011-05-02 NOTE — Telephone Encounter (Signed)
Triage vm----------requesting test results. °

## 2011-05-02 NOTE — Telephone Encounter (Signed)
Labs and X-ray results mailed

## 2011-05-03 ENCOUNTER — Telehealth: Payer: Self-pay | Admitting: *Deleted

## 2011-05-03 NOTE — Telephone Encounter (Signed)
Pt is asking for labs and xray results.

## 2011-05-03 NOTE — Telephone Encounter (Signed)
done

## 2011-05-06 ENCOUNTER — Ambulatory Visit (INDEPENDENT_AMBULATORY_CARE_PROVIDER_SITE_OTHER): Payer: BC Managed Care – PPO | Admitting: Internal Medicine

## 2011-05-06 ENCOUNTER — Encounter: Payer: Self-pay | Admitting: Internal Medicine

## 2011-05-06 ENCOUNTER — Encounter: Payer: Self-pay | Admitting: *Deleted

## 2011-05-06 ENCOUNTER — Telehealth: Payer: Self-pay | Admitting: Internal Medicine

## 2011-05-06 ENCOUNTER — Ambulatory Visit: Payer: Self-pay | Admitting: Internal Medicine

## 2011-05-06 VITALS — BP 126/84 | HR 85

## 2011-05-06 DIAGNOSIS — R1032 Left lower quadrant pain: Secondary | ICD-10-CM

## 2011-05-06 DIAGNOSIS — R51 Headache: Secondary | ICD-10-CM

## 2011-05-06 MED ORDER — DICYCLOMINE HCL 10 MG PO CAPS: 10.0000 mg | ORAL_CAPSULE | Freq: Four times a day (QID) | ORAL | Status: AC | PRN

## 2011-05-06 NOTE — Telephone Encounter (Signed)
Pt has seen Dr. Rodena Medin and referred to GI STAT.

## 2011-05-06 NOTE — Telephone Encounter (Signed)
Scheduled patient with Dr. Leone Payor at 2:15 PM today. Terry with Dr. Ilda Foil office aware.

## 2011-05-08 ENCOUNTER — Ambulatory Visit: Payer: Self-pay | Admitting: Physician Assistant

## 2011-05-10 ENCOUNTER — Encounter: Payer: Self-pay | Admitting: Internal Medicine

## 2011-05-10 ENCOUNTER — Other Ambulatory Visit (INDEPENDENT_AMBULATORY_CARE_PROVIDER_SITE_OTHER): Payer: BC Managed Care – PPO

## 2011-05-10 ENCOUNTER — Ambulatory Visit (INDEPENDENT_AMBULATORY_CARE_PROVIDER_SITE_OTHER): Payer: BC Managed Care – PPO | Admitting: Internal Medicine

## 2011-05-10 VITALS — BP 112/62 | HR 80 | Ht 67.0 in | Wt 188.8 lb

## 2011-05-10 DIAGNOSIS — R1032 Left lower quadrant pain: Secondary | ICD-10-CM

## 2011-05-10 LAB — COMPREHENSIVE METABOLIC PANEL
ALT: 21 U/L (ref 0–53)
AST: 21 U/L (ref 0–37)
CO2: 30 mEq/L (ref 19–32)
Calcium: 9.4 mg/dL (ref 8.4–10.5)
Chloride: 107 mEq/L (ref 96–112)
GFR: 184.08 mL/min (ref >60.00)
Sodium: 141 mEq/L (ref 135–145)
Total Protein: 6.8 g/dL (ref 6.0–8.3)

## 2011-05-10 NOTE — Patient Instructions (Signed)
You have been scheduled for a CT of Abdomen/Pelvic with contrast with separate instructions given. Please go to the basement upon leaving today to have your labs done.

## 2011-05-10 NOTE — Assessment & Plan Note (Signed)
X 2-3 weeks Low-grade constant but can be severe ? Internal hernia or other  CMET, lipase and amylase and CT to investigate

## 2011-05-10 NOTE — Progress Notes (Signed)
  Subjective:    Patient ID: Tony Davila, male    DOB: 02/13/1981, 30 y.o.   MRN: [id number]  HPI30 yo wm s/p bariatric gastric bypass. LLQ pain, always a pain with exacerbations that are very severe and can double him over. May be worse when he works and has to lift a lot ([employer]). Bowels regular and no obvious trigger except ? Lifting. Heartburn/reflux better on omeprazole 40 mg/day. Still has intermittent vomiting every few days but has been that way after gastric by-pass.   Review of Systems As per HPI    Objective:   Physical Exam  Constitutional: He appears well-developed and well-nourished.  Eyes: No scleral icterus.  Cardiovascular: Normal rate and regular rhythm.   Pulmonary/Chest: Effort normal and breath sounds normal.  Abdominal: Soft. Bowel sounds are normal. There is tenderness. There is guarding. There is no rebound.       Firm and tender in LUQ and mid quadrant No abdominal wall hernia  Psychiatric:       Slightly flat          Assessment & Plan:

## 2011-05-12 ENCOUNTER — Encounter: Payer: Self-pay | Admitting: Internal Medicine

## 2011-05-12 NOTE — Assessment & Plan Note (Signed)
Improving with conservative care. No associated neurologic deficits.

## 2011-05-12 NOTE — Assessment & Plan Note (Signed)
Persistent symptoms. Failed empiric course of antibiotics. Attempt Bentyl p.r.n. Schedule GI consult and consider possible abdominal CT depending on the timing of GI appointment

## 2011-05-12 NOTE — Progress Notes (Signed)
  Subjective:    Patient ID: Tony Davila, male    DOB: 03-25-1981, 30 y.o.   MRN: [id number]  HPI patient presents to clinic for followup of abdominal pain and headaches. Headaches much improved after Toradol injection and Fioricet as Midrin was not currently available. No neurologic associated symptoms. Left lower quadrant pain and tenderness has worsened despite empiric Levaquin. No fever chills change in bowel habits or blood in stool. No exacerbating or alleviating factors. No other complaints  Reviewed past medical history, medications and allergies  Review of Systems see history of present illness     Objective:   Physical Exam  Nursing note and vitals reviewed. Constitutional: He appears well-developed and well-nourished. No distress.  HENT:  Head: Normocephalic and atraumatic.  Right Ear: External ear normal.  Left Ear: External ear normal.  Nose: Nose normal.  Eyes: Conjunctivae are normal. No scleral icterus.  Abdominal: Soft. Bowel sounds are normal. He exhibits no distension and no mass. There is tenderness in the left lower quadrant. There is no rebound and no guarding.  Neurological: He is alert.  Skin: Skin is warm and dry. He is not diaphoretic.  Psychiatric: He has a normal mood and affect.          Assessment & Plan:

## 2011-05-13 ENCOUNTER — Ambulatory Visit (INDEPENDENT_AMBULATORY_CARE_PROVIDER_SITE_OTHER)
Admission: RE | Admit: 2011-05-13 | Discharge: 2011-05-13 | Disposition: A | Discharge status: Home / Self Care | Payer: BC Managed Care – PPO | Source: Ambulatory Visit | Attending: Internal Medicine | Admitting: Internal Medicine

## 2011-05-13 DIAGNOSIS — R1032 Left lower quadrant pain: Secondary | ICD-10-CM

## 2011-05-13 MED ORDER — IOHEXOL 300 MG/ML  SOLN
80.0000 mL | Freq: Once | INTRAMUSCULAR | Status: AC | PRN
  Administered 2011-05-13: 80 mL via INTRAVENOUS

## 2011-05-14 ENCOUNTER — Telehealth: Payer: Self-pay | Admitting: Internal Medicine

## 2011-05-14 NOTE — Telephone Encounter (Signed)
Patient advised that CT showed no acute problems and we will call him with the results when Dr Leone Payor has reviewed.

## 2011-05-15 ENCOUNTER — Telehealth: Payer: Self-pay | Admitting: *Deleted

## 2011-05-15 NOTE — Progress Notes (Signed)
Quick Note:  This does not show any problem He needs to see his surgeon about his pain Left abdominal pain s/p gastric bypass, ? Undiagnosed internal hernia or other with negative EGD/CT recently - I believe it was Dr. Johna Sheriff - please facilitate the follow-up (within 2 weeks) and send notes if not done already ______

## 2011-05-15 NOTE — Telephone Encounter (Signed)
Called Dr. Jamse Mead office and left a message for his nurse Maryan Puls that patient needs to be seen within the next 2 weeks. Faxed records to Dr Johna Sheriff. Patient aware of above.

## 2011-05-15 NOTE — Telephone Encounter (Signed)
Message copied by Florene Glen on Wed May 15, 2011 10:50 AM ------      Message from: Stan Head E      Created: Wed May 15, 2011  6:54 AM       This does not show any problem      He needs to see his surgeon about his pain Left abdominal pain s/p gastric bypass, ? Undiagnosed internal hernia or other with negative EGD/CT recently  - I believe it was Dr. Johna Sheriff - please facilitate the follow-up (within 2 weeks) and send notes if not done already

## 2011-05-15 NOTE — Telephone Encounter (Signed)
Message copied by Daphine Deutscher on Wed May 15, 2011 10:43 AM ------      Message from: Stan Head E      Created: Wed May 15, 2011  6:54 AM       This does not show any problem      He needs to see his surgeon about his pain Left abdominal pain s/p gastric bypass, ? Undiagnosed internal hernia or other with negative EGD/CT recently  - I believe it was Dr. Johna Sheriff - please facilitate the follow-up (within 2 weeks) and send notes if not done already

## 2011-05-16 ENCOUNTER — Other Ambulatory Visit: Payer: Self-pay | Admitting: Internal Medicine

## 2011-05-16 ENCOUNTER — Other Ambulatory Visit: Payer: Self-pay

## 2011-05-16 NOTE — Telephone Encounter (Signed)
Spoke with [facility] and Neysa Bonito is working on scheduling patient.

## 2011-05-20 NOTE — Telephone Encounter (Signed)
Patient is scheduled with Dr Johna Sheriff for 05/30/11 9:55.  I have left him a message on his machine with the details and have asked him to call back with any questions or concerns.

## 2011-05-30 ENCOUNTER — Ambulatory Visit (INDEPENDENT_AMBULATORY_CARE_PROVIDER_SITE_OTHER): Payer: BC Managed Care – PPO | Admitting: General Surgery

## 2011-06-05 ENCOUNTER — Encounter (INDEPENDENT_AMBULATORY_CARE_PROVIDER_SITE_OTHER): Payer: Self-pay | Admitting: General Surgery

## 2011-06-06 ENCOUNTER — Other Ambulatory Visit (INDEPENDENT_AMBULATORY_CARE_PROVIDER_SITE_OTHER): Payer: Self-pay

## 2011-06-06 ENCOUNTER — Encounter (INDEPENDENT_AMBULATORY_CARE_PROVIDER_SITE_OTHER): Payer: Self-pay | Admitting: General Surgery

## 2011-06-06 ENCOUNTER — Ambulatory Visit (INDEPENDENT_AMBULATORY_CARE_PROVIDER_SITE_OTHER): Payer: BC Managed Care – PPO | Admitting: General Surgery

## 2011-06-06 DIAGNOSIS — Z9884 Bariatric surgery status: Secondary | ICD-10-CM

## 2011-06-06 DIAGNOSIS — R1032 Left lower quadrant pain: Secondary | ICD-10-CM

## 2011-06-06 LAB — LIPID PANEL
Cholesterol: 133 mg/dL (ref 0–200)
LDL Cholesterol: 71 mg/dL (ref 0–99)
Total CHOL/HDL Ratio: 2.7 Ratio
Triglycerides: 58 mg/dL (ref <150)
VLDL: 12 mg/dL (ref 0–40)

## 2011-06-06 LAB — IRON AND TIBC
Iron: 135 ug/dL (ref 42–165)
UIBC: 282 ug/dL

## 2011-06-06 LAB — MAGNESIUM: Magnesium: 2.1 mg/dL (ref 1.5–2.5)

## 2011-06-06 LAB — VITAMIN B12: Vitamin B-12: 1257 pg/mL — ABNORMAL HIGH (ref 211–911)

## 2011-06-06 NOTE — Progress Notes (Signed)
Subjective:   Abdominal Pain  Patient ID: Tony Davila, male   DOB: 1980/12/22, 30 y.o.   MRN: [id number]    BP 126/78  Pulse 70  Temp(Src) 97.9 F (36.6 C) (Temporal)  Resp 16  Ht 5\' 6"  (1.676 m)  Wt 188 lb 6.4 oz (85.458 kg)  BMI 30.41 kg/m2    HPI Patient returns with a history of gastric bypass for morbid obesity performed in June of 2009. He also has a history of marginal ulcer treated about 6 months postoperatively that resolved. I have not seen him in approximately a year and a half. He has had excellent weight loss from preoperatively 364 pounds and BMI 58.3 current 176 pounds and BMI of 30.3.  The patient ever comes in today with a complaint of left lower quadrant abdominal pain. This is been going on for approximately 2 months. He describes crampy pain located in his left lower quadrant. It is severe at times and doubles him over. It is not related to bowel movements eating or physical activity. It occurs almost every day. His bowels are moving regularly. He has rare nausea and vomiting and this is unrelated to the pain.  He has seen Dr. Leone Payor. A CT scan of the abdomen and pelvis was obtained which are reviewed. This shows moderate increased stool in the colon but no other abnormalities.  Review of Systems  Constitutional: Negative for fever, chills and fatigue.  Respiratory: Negative for cough and shortness of breath.   Cardiovascular: Negative for chest pain.  Gastrointestinal: Positive for abdominal pain. Negative for nausea, vomiting, diarrhea, constipation, blood in stool, abdominal distention and anal bleeding.       Objective:   Physical Exam General: Well-developed in no acute distress  HEENT: No palpable mass or thyromegaly. Sclera nonicteric.  Lungs: Clear without increased work of breathing  Vascular: Regular rate and rhythm without murmur. No edema.  Abdomen: Nondistended. Well-healed laparoscopic incisions. I cannot feel any evidence of hernia with  the patient coughing or straining. There is mild tenderness in the left lower quadrant. I do not feel any masses. There may be some slight fullness here. Upper abdomen is soft and nontender.    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 He has resolved hypertension. He has a history of marginal ulcer. He now has persistent intermittent crampy left lower quadrant abdominal pain. CT scan was unremarkable except it did show some increased stool in the colon. The location of his pain is quite unusual for an internal hernia.    Plan:    For now I'm going to put him on a bowel regimen to see if getting his colon cleaned out has any effect on the pain. I will plan to see him back in 3 weeks. If his pain persists, although it is atypical for internal hernia, we may need to consider diagnostic laparoscopy.

## 2011-06-06 NOTE — Patient Instructions (Signed)
Takes MiraLax daily until stools are loose and he feel cleaned out. Call for any worsening symptoms.

## 2011-06-20 ENCOUNTER — Encounter (INDEPENDENT_AMBULATORY_CARE_PROVIDER_SITE_OTHER): Payer: Self-pay | Admitting: General Surgery

## 2011-06-21 ENCOUNTER — Ambulatory Visit (INDEPENDENT_AMBULATORY_CARE_PROVIDER_SITE_OTHER): Payer: BC Managed Care – PPO | Admitting: General Surgery

## 2011-06-28 ENCOUNTER — Telehealth: Payer: Self-pay | Admitting: Internal Medicine

## 2011-06-28 MED ORDER — BUTALBITAL-APAP-CAFFEINE 50-325-40 MG PO TABS: 1.0000 | ORAL_TABLET | Freq: Two times a day (BID) | ORAL | Status: DC | PRN

## 2011-06-28 NOTE — Telephone Encounter (Signed)
Ok with [employer]

## 2011-06-28 NOTE — Telephone Encounter (Signed)
Refill- butalb-acetamin-caff 50-325-40. Take one tablet twice a day as needed for headache. Qty 30. Last fill 6.25.12

## 2011-06-28 NOTE — Telephone Encounter (Signed)
Call placed [facility] [phone], spoke with Rudell Cobb he was provided verbal approval per Dr Elyse Hsu

## 2011-06-28 NOTE — Telephone Encounter (Signed)
Is it okay to refill this medication?

## 2011-07-04 ENCOUNTER — Ambulatory Visit: Payer: Self-pay | Admitting: Family Medicine

## 2011-07-09 ENCOUNTER — Encounter: Payer: Self-pay | Admitting: Internal Medicine

## 2011-07-09 ENCOUNTER — Ambulatory Visit (INDEPENDENT_AMBULATORY_CARE_PROVIDER_SITE_OTHER): Payer: BC Managed Care – PPO | Admitting: Internal Medicine

## 2011-07-09 VITALS — BP 111/60 | HR 55 | Temp 97.7°F | Resp 16 | Wt 182.0 lb

## 2011-07-09 DIAGNOSIS — J069 Acute upper respiratory infection, unspecified: Secondary | ICD-10-CM

## 2011-07-09 DIAGNOSIS — R51 Headache: Secondary | ICD-10-CM

## 2011-07-09 DIAGNOSIS — F329 Major depressive disorder, single episode, unspecified: Secondary | ICD-10-CM

## 2011-07-09 MED ORDER — DESVENLAFAXINE SUCCINATE ER 50 MG PO TB24: 50.0000 mg | ORAL_TABLET | Freq: Every day | ORAL | Status: DC

## 2011-07-09 MED ORDER — KETOROLAC TROMETHAMINE 30 MG/ML IJ SOLN: 30.0000 mg | Freq: Once | INTRAMUSCULAR | Status: DC

## 2011-07-09 MED ORDER — KETOROLAC TROMETHAMINE 30 MG/ML IJ SOLN
30.0000 mg | Freq: Once | INTRAMUSCULAR | Status: AC
  Administered 2011-07-09: 30 mg via INTRAMUSCULAR

## 2011-07-09 MED ORDER — SUMATRIPTAN SUCCINATE 50 MG PO TABS: 50.0000 mg | ORAL_TABLET | Freq: Once | ORAL | Status: DC | PRN

## 2011-07-10 ENCOUNTER — Other Ambulatory Visit (HOSPITAL_BASED_OUTPATIENT_CLINIC_OR_DEPARTMENT_OTHER): Payer: Self-pay

## 2011-07-10 DIAGNOSIS — J069 Acute upper respiratory infection, unspecified: Secondary | ICD-10-CM | POA: Insufficient documentation

## 2011-07-10 NOTE — Progress Notes (Signed)
  Subjective:    Patient ID: Tony Davila, male    DOB: May 11, 1981, 30 y.o.   MRN: [id number]  HPI patient presents to clinic for evaluation of headaches. Continues to have chronic daily headaches unresolved. The patient has frontal without radiation. Fioricet initially improved but now helps only mildly. Does not have associated nausea but does have photophobia and slight intermittent dizziness. Denies any focal neurologic deficits such as weakness or difficulty with speech. Pain is moderately severe today. Also notes history of depression not well controlled on Prozac. Has increased moodiness as well as depressed mood. No SI. Also noticed today history of cough productive for brown sputum without hemoptysis fever or chills. No other complaints.  Reviewed past medical history, medications and allergies    Review of Systems see history of present illness     Objective:   Physical Exam  Physical Exam  Vitals reviewed. Constitutional:  appears well-developed and well-nourished. No distress.  HENT:  Head: Normocephalic and atraumatic.  Nose: Nose normal.   Right eye exhibits no discharge. Left eye exhibits no discharge. No scleral icterus.  Neck: Neck supple. No thyromegaly present.  Cardiovascular: Normal rate, regular rhythm and normal heart sounds.  Exam reveals no gallop and no friction rub.   No murmur heard. Pulmonary/Chest: Effort normal and breath sounds normal. No respiratory distress.  has no wheezes.  has no rales.  Lymphadenopathy:   no cervical adenopathy.  Neurological:  is alert.  Skin: Skin is warm and dry.  not diaphoretic.  Psychiatric: normal mood and affect.      Assessment & Plan:

## 2011-07-10 NOTE — Assessment & Plan Note (Signed)
Suspect viral etiology. OTC symptomatic relief p.r.n. Consider antibiotics if symptoms do not improve after a total duration of 7-10 days.

## 2011-07-10 NOTE — Assessment & Plan Note (Signed)
Change Prozac to viibryd. Close followup scheduled

## 2011-07-10 NOTE — Assessment & Plan Note (Signed)
Provided with Toradol 30 mg IM today. Attempt Imitrex sparingly given depression medication as well. Schedule cranial MRI due to refractory symptoms. Consider neurology evaluation if symptoms remain refractory

## 2011-07-16 ENCOUNTER — Telehealth: Payer: Self-pay | Admitting: *Deleted

## 2011-07-16 MED ORDER — HYDROCOD POLST-CHLORPHEN POLST 10-8 MG/5ML PO LQCR: 5.0000 mL | Freq: Two times a day (BID) | ORAL | Status: DC | PRN

## 2011-07-16 MED ORDER — DOXYCYCLINE HYCLATE 100 MG PO CAPS: 100.0000 mg | ORAL_CAPSULE | Freq: Two times a day (BID) | ORAL | Status: AC

## 2011-07-16 NOTE — Telephone Encounter (Signed)
Doxycycline 100mg  bid x 10d and tussionex 5ml po q12 hours prn cough #131ml if not allergic and no interactions

## 2011-07-16 NOTE — Telephone Encounter (Signed)
Patient called and left voice message stating he was seen last week and his congestion is not any better. He would like to know if he could get a rx for cough syrup and a antibiotic

## 2011-07-16 NOTE — Telephone Encounter (Signed)
[phone]. Call placed to patient [phone], no answer. A detailed voice message was left informing patient Rx sent to pharmacy. Message was left advising patient to call back if no improvement in symptoms.

## 2011-07-17 ENCOUNTER — Encounter (HOSPITAL_BASED_OUTPATIENT_CLINIC_OR_DEPARTMENT_OTHER): Payer: Self-pay

## 2011-07-17 ENCOUNTER — Ambulatory Visit (HOSPITAL_BASED_OUTPATIENT_CLINIC_OR_DEPARTMENT_OTHER)
Admission: RE | Admit: 2011-07-17 | Discharge: 2011-07-17 | Disposition: A | Discharge status: Home / Self Care | Payer: BC Managed Care – PPO | Source: Ambulatory Visit | Attending: Internal Medicine | Admitting: Internal Medicine

## 2011-07-17 DIAGNOSIS — R51 Headache: Secondary | ICD-10-CM | POA: Insufficient documentation

## 2011-07-17 MED ORDER — GADOBENATE DIMEGLUMINE 529 MG/ML IV SOLN
17.0000 mL | Freq: Once | INTRAVENOUS | Status: AC | PRN
  Administered 2011-07-17: 17 mL via INTRAVENOUS

## 2011-07-19 ENCOUNTER — Telehealth: Payer: Self-pay | Admitting: *Deleted

## 2011-07-19 NOTE — Telephone Encounter (Signed)
Call was placed to patient to inform him of his MRI results. He was advised of test results per Dr Rodena Medin instruction. Patient wanted Dr Rodena Medin to know that he has a episode about an hour prior to phone call of blurred vision and dizziness. He has denies any other symptoms, and wanted to make Dr. Rodena Medin aware of this occurrence.

## 2011-07-19 NOTE — Telephone Encounter (Signed)
Next step would be neurology as discussed in clinic. Should be trying imitrex

## 2011-07-22 NOTE — Telephone Encounter (Signed)
Call returned to patient at [phone], no answer. A detailed voice message was left informing patient per Dr Rodena Medin instruction.

## 2011-07-24 ENCOUNTER — Ambulatory Visit (INDEPENDENT_AMBULATORY_CARE_PROVIDER_SITE_OTHER): Payer: BC Managed Care – PPO | Admitting: General Surgery

## 2011-07-30 ENCOUNTER — Ambulatory Visit (INDEPENDENT_AMBULATORY_CARE_PROVIDER_SITE_OTHER): Payer: BC Managed Care – PPO | Admitting: Internal Medicine

## 2011-07-30 ENCOUNTER — Ambulatory Visit: Payer: Self-pay | Admitting: Internal Medicine

## 2011-07-30 ENCOUNTER — Encounter: Payer: Self-pay | Admitting: Internal Medicine

## 2011-07-30 DIAGNOSIS — F329 Major depressive disorder, single episode, unspecified: Secondary | ICD-10-CM

## 2011-07-30 DIAGNOSIS — R51 Headache: Secondary | ICD-10-CM

## 2011-07-30 MED ORDER — TOPIRAMATE 50 MG PO TABS: 50.0000 mg | ORAL_TABLET | Freq: Two times a day (BID) | ORAL | Status: DC

## 2011-07-31 NOTE — Assessment & Plan Note (Signed)
Improved control. Continue pristiq.

## 2011-07-31 NOTE — Assessment & Plan Note (Signed)
Begin topamax bid. Cranial imaging unremarkable. Consider neurology consult if sx's persist

## 2011-07-31 NOTE — Progress Notes (Signed)
  Subjective:    Patient ID: Tony Davila, male    DOB: [birth year], 28 y.o.   MRN: [id number]  HPI Pt presents to clinic for followup of multiple medical problems. Continues with daily chronic headaches. imitrex helps pain but does not abort ha. Reviewed nl mri of brain recently performed. No associated neurologic sx's. Last visit prozac changed to pristiq and depressive sx's improved. Notes no side effects since beginning medication. S/p abx course for bronchitis and cough resolved. Feels back to baseline. No other complaints.  Past Medical History  Diagnosis Date  . 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 Depression   . Kidney stone   . Anemia   . GERD (gastroesophageal reflux disease)   . Hypertension     not tx for now  . Hiatal hernia   . Jejunal ulcer 2009    anastomotic   Past Surgical History  Procedure Date  . Tonsillectomy   . Exploratory laparotomy   . Gastric bypass 2009    Dr. Johna Sheriff  . Upper gastrointestinal endoscopy 02/19/11    normal post-op exam    reports that he has been smoking Cigarettes.  He has a 6 pack-year smoking history. He has never used smokeless tobacco. He reports that he does not drink alcohol or use illicit drugs. family history includes Heart disease in his maternal grandmother; Hyperlipidemia in his maternal grandmother; Hypertension in his maternal grandmother; Irritable bowel syndrome in his brother and father; and Mental illness in his maternal grandmother.  There is no history of Colon cancer. No Known Allergies   Review of Systems see hpi     Objective:   Physical Exam  Nursing note and vitals reviewed. Constitutional: He appears well-developed and well-nourished. No distress.  HENT:  Head: Normocephalic and atraumatic.  Neurological: He is alert.  Skin: He is not diaphoretic.  Psychiatric: He has a normal mood and affect.          Assessment & Plan:

## 2011-08-09 ENCOUNTER — Encounter (INDEPENDENT_AMBULATORY_CARE_PROVIDER_SITE_OTHER): Payer: Self-pay | Admitting: General Surgery

## 2011-08-22 ENCOUNTER — Telehealth: Payer: Self-pay | Admitting: Internal Medicine

## 2011-08-22 LAB — DIFFERENTIAL
Basophils Absolute: 0
Basophils Absolute: 0.1
Basophils Relative: 0
Basophils Relative: 1
Eosinophils Absolute: 0
Eosinophils Absolute: 0
Eosinophils Relative: 0
Eosinophils Relative: 0
Lymphocytes Relative: 24
Lymphocytes Relative: 9 — ABNORMAL LOW
Lymphs Abs: 1
Lymphs Abs: 2.6
Monocytes Absolute: 0.8
Monocytes Absolute: 1.1 — ABNORMAL HIGH
Monocytes Relative: 10
Monocytes Relative: 7
Neutro Abs: 6.9
Neutro Abs: 9.2 — ABNORMAL HIGH
Neutrophils Relative %: 64
Neutrophils Relative %: 83 — ABNORMAL HIGH

## 2011-08-22 LAB — CBC
HCT: 38.4 — ABNORMAL LOW
Hemoglobin: 13.6
MCHC: 35.5
MCV: 88.6
Platelets: 259
Platelets: 288
RBC: 4.34
RDW: 13.1
RDW: 13.5
WBC: 10.8 — ABNORMAL HIGH
WBC: 11 — ABNORMAL HIGH

## 2011-08-22 LAB — BASIC METABOLIC PANEL
Calcium: 9.5
GFR calc Af Amer: >60
GFR calc non Af Amer: >60
Potassium: 3.9
Sodium: 141

## 2011-08-22 LAB — HEMOGLOBIN AND HEMATOCRIT, BLOOD
HCT: 39.1
HCT: 42
HCT: 43.9
Hemoglobin: 13.7
Hemoglobin: 14.8
Hemoglobin: 15.3

## 2011-08-22 MED ORDER — DESVENLAFAXINE SUCCINATE ER 50 MG PO TB24: 50.0000 mg | ORAL_TABLET | Freq: Every day | ORAL | Status: DC

## 2011-08-22 NOTE — Telephone Encounter (Signed)
Call from pt  He lost his rx for pristiq wants medication called  [facility]  [place]

## 2011-08-22 NOTE — Telephone Encounter (Signed)
Rx sent to pharmacy   

## 2011-08-30 LAB — DIFFERENTIAL
Basophils Absolute: 0 10*3/uL (ref 0.0–0.1)
Eosinophils Absolute: 0 10*3/uL (ref 0.0–0.7)
Eosinophils Relative: 0 % (ref 0–5)
Lymphs Abs: 1 10*3/uL (ref 0.7–4.0)

## 2011-08-30 LAB — CBC
HCT: 35.4 % — ABNORMAL LOW (ref 39.0–52.0)
Hemoglobin: 11.5 g/dL — ABNORMAL LOW (ref 13.0–17.0)
MCHC: 32.5 g/dL (ref 30.0–36.0)
RBC: 4.33 MIL/uL (ref 4.22–5.81)

## 2011-08-30 LAB — PROTIME-INR
INR: 1.1 (ref 0.00–1.49)
Prothrombin Time: 14.3 seconds (ref 11.6–15.2)

## 2011-11-13 ENCOUNTER — Ambulatory Visit (INDEPENDENT_AMBULATORY_CARE_PROVIDER_SITE_OTHER): Payer: BC Managed Care – PPO | Admitting: Internal Medicine

## 2011-11-13 ENCOUNTER — Encounter: Payer: Self-pay | Admitting: Internal Medicine

## 2011-11-13 VITALS — BP 110/60 | HR 66 | Temp 98.0°F | Resp 18 | Wt 173.0 lb

## 2011-11-13 DIAGNOSIS — K146 Glossodynia: Secondary | ICD-10-CM

## 2011-11-13 DIAGNOSIS — Z23 Encounter for immunization: Secondary | ICD-10-CM

## 2011-11-13 DIAGNOSIS — F329 Major depressive disorder, single episode, unspecified: Secondary | ICD-10-CM

## 2011-11-13 DIAGNOSIS — N529 Male erectile dysfunction, unspecified: Secondary | ICD-10-CM

## 2011-11-13 DIAGNOSIS — R51 Headache: Secondary | ICD-10-CM

## 2011-11-13 MED ORDER — DESVENLAFAXINE SUCCINATE ER 100 MG PO TB24: 50.0000 mg | ORAL_TABLET | Freq: Every day | ORAL | Status: DC

## 2011-11-13 MED ORDER — NYSTATIN 100000 UNIT/ML MT SUSP: 500000.0000 [IU] | Freq: Four times a day (QID) | OROMUCOSAL | Status: AC

## 2011-11-13 NOTE — Progress Notes (Signed)
  Subjective:    Patient ID: Tony Davila, male    DOB: 1981-04-13, 30 y.o.   MRN: [id number]  HPI Pt presents to clinic for followup of multiple medical problems. Notes increased stress/stressors. Feels pristiq not controlling adequately. Previous chronic ha's had resolved but recently notes intermittent ha's. Wonders if may be related to vision. No associated neurologic sx's.  Also notes ED with past h/o testosterone replacement. Has tongue soreness without ST or white plaques for ~3 days. No other complaints.  Past Medical History  Diagnosis Date  . 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 Depression   . Kidney stone   . Anemia   . GERD (gastroesophageal reflux disease)   . Hypertension     not tx for now  . Hiatal hernia   . Jejunal ulcer 2009    anastomotic   Past Surgical History  Procedure Date  . Tonsillectomy   . Exploratory laparotomy   . Gastric bypass 2009    Dr. Johna Sheriff  . Upper gastrointestinal endoscopy 02/19/11    normal post-op exam    reports that he has been smoking Cigarettes.  He has a 6 pack-year smoking history. He has never used smokeless tobacco. He reports that he does not drink alcohol or use illicit drugs. family history includes Heart disease in his maternal grandmother; Hyperlipidemia in his maternal grandmother; Hypertension in his maternal grandmother; Irritable bowel syndrome in his brother and father; and Mental illness in his maternal grandmother.  There is no history of Colon cancer. No Known Allergies    Review of Systems see hpi     Objective:   Physical Exam  Nursing note and vitals reviewed. Constitutional: He appears well-developed and well-nourished. No distress.  HENT:  Head: Normocephalic and atraumatic.  Right Ear: External ear normal.  Left Ear: External ear normal.  Nose: Nose normal.  Mouth/Throat: Oropharynx is clear and moist. No oropharyngeal exudate.  Eyes: Conjunctivae and EOM are normal.  Pupils are equal, round, and reactive to light. No scleral icterus.  Neck: Neck supple.  Neurological: He is alert. No cranial nerve deficit. Coordination normal.  Skin: Skin is warm. He is not diaphoretic.  Psychiatric: He has a normal mood and affect.          Assessment & Plan:

## 2011-11-13 NOTE — Patient Instructions (Signed)
Please increase your pristiq to 2 a day. You have a new prescription after that for the higher dose 100mg  a day

## 2011-11-17 DIAGNOSIS — N529 Male erectile dysfunction, unspecified: Secondary | ICD-10-CM | POA: Insufficient documentation

## 2011-11-17 DIAGNOSIS — K146 Glossodynia: Secondary | ICD-10-CM | POA: Insufficient documentation

## 2011-11-17 NOTE — Assessment & Plan Note (Signed)
Attempt nystatin. Followup if no improvement or worsening.  

## 2011-11-17 NOTE — Assessment & Plan Note (Signed)
Increase pristiq dose. Followup if no improvement or worsening.

## 2011-11-17 NOTE — Assessment & Plan Note (Signed)
Obtain testosterone level.  

## 2011-11-17 NOTE — Assessment & Plan Note (Signed)
Schedule vision exam. Followup if no improvement or worsening.

## 2011-11-20 ENCOUNTER — Ambulatory Visit (INDEPENDENT_AMBULATORY_CARE_PROVIDER_SITE_OTHER): Payer: BC Managed Care – PPO | Admitting: Internal Medicine

## 2011-11-20 ENCOUNTER — Encounter: Payer: Self-pay | Admitting: Internal Medicine

## 2011-11-20 VITALS — BP 108/70 | HR 55 | Temp 97.7°F | Resp 18 | Wt 168.0 lb

## 2011-11-20 DIAGNOSIS — J069 Acute upper respiratory infection, unspecified: Secondary | ICD-10-CM | POA: Insufficient documentation

## 2011-11-20 LAB — POCT INFLUENZA A/B: Influenza A, POC: NEGATIVE

## 2011-11-20 MED ORDER — OSELTAMIVIR PHOSPHATE 75 MG PO CAPS: 75.0000 mg | ORAL_CAPSULE | Freq: Two times a day (BID) | ORAL | Status: AC

## 2011-11-20 NOTE — Assessment & Plan Note (Signed)
Flu swab neg however clinical presentation strongly suggestive of influenza. Recommend empiric tx with tamiflu. Work note provided and faxed. Followup if no improvement or worsening.

## 2011-11-20 NOTE — Progress Notes (Signed)
  Subjective:    Patient ID: Tony Davila, male    DOB: 04-15-1981, 30 y.o.   MRN: [id number]  HPI Pt presents to clinic for evaluation o cough. Notes sudden onset last night of NP cough, nasal drainage (yellow), headache and diffuse myalgias. +fever with tmax 102 without chills. Has has possible flu exposure. No alleviating or exacerbating factors. No other complaints.  Past Medical History  Diagnosis Date  . 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 Depression   . Kidney stone   . Anemia   . GERD (gastroesophageal reflux disease)   . Hypertension     not tx for now  . Hiatal hernia   . Jejunal ulcer 2009    anastomotic   Past Surgical History  Procedure Date  . Tonsillectomy   . Exploratory laparotomy   . Gastric bypass 2009    Dr. Johna Sheriff  . Upper gastrointestinal endoscopy 02/19/11    normal post-op exam    reports that he has been smoking Cigarettes.  He has a 6 pack-year smoking history. He has never used smokeless tobacco. He reports that he does not drink alcohol or use illicit drugs. family history includes Heart disease in his maternal grandmother; Hyperlipidemia in his maternal grandmother; Hypertension in his maternal grandmother; Irritable bowel syndrome in his brother and father; and Mental illness in his maternal grandmother.  There is no history of Colon cancer. No Known Allergies   Review of Systems see hpi     Objective:   Physical Exam  Nursing note and vitals reviewed. Constitutional: He appears well-developed and well-nourished. No distress.  HENT:  Head: Normocephalic and atraumatic.  Right Ear: Tympanic membrane, external ear and ear canal normal.  Left Ear: Tympanic membrane, external ear and ear canal normal.  Nose: Nose normal.  Mouth/Throat: Oropharynx is clear and moist. No oropharyngeal exudate.  Eyes: Conjunctivae are normal. Right eye exhibits no discharge. Left eye exhibits no discharge. No scleral icterus.    Pulmonary/Chest: Breath sounds normal. No respiratory distress. He has no wheezes. He has no rales.  Neurological: He is alert.  Skin: Skin is warm and dry. He is not diaphoretic.  Psychiatric: He has a normal mood and affect.          Assessment & Plan:

## 2011-12-23 ENCOUNTER — Ambulatory Visit (HOSPITAL_BASED_OUTPATIENT_CLINIC_OR_DEPARTMENT_OTHER)
Admission: RE | Admit: 2011-12-23 | Discharge: 2011-12-23 | Disposition: A | Discharge status: Home / Self Care | Payer: BC Managed Care – PPO | Source: Ambulatory Visit | Attending: Family | Admitting: Family

## 2011-12-23 ENCOUNTER — Ambulatory Visit (INDEPENDENT_AMBULATORY_CARE_PROVIDER_SITE_OTHER): Payer: BC Managed Care – PPO | Admitting: Family

## 2011-12-23 ENCOUNTER — Encounter: Payer: Self-pay | Admitting: Family

## 2011-12-23 ENCOUNTER — Telehealth: Payer: Self-pay | Admitting: Family

## 2011-12-23 VITALS — BP 116/70 | HR 69 | Temp 97.7°F | Resp 16

## 2011-12-23 DIAGNOSIS — J45901 Unspecified asthma with (acute) exacerbation: Secondary | ICD-10-CM | POA: Insufficient documentation

## 2011-12-23 DIAGNOSIS — R059 Cough, unspecified: Secondary | ICD-10-CM | POA: Insufficient documentation

## 2011-12-23 DIAGNOSIS — R05 Cough: Secondary | ICD-10-CM | POA: Insufficient documentation

## 2011-12-23 DIAGNOSIS — R062 Wheezing: Secondary | ICD-10-CM | POA: Insufficient documentation

## 2011-12-23 DIAGNOSIS — J189 Pneumonia, unspecified organism: Secondary | ICD-10-CM | POA: Insufficient documentation

## 2011-12-23 MED ORDER — PREDNISONE 10 MG PO TABS: ORAL_TABLET | ORAL | Status: DC

## 2011-12-23 NOTE — Telephone Encounter (Signed)
Reviewed as below with pt on phone.

## 2011-12-23 NOTE — Patient Instructions (Signed)
Please complete your chest x-ray on the first floor.  Start prednisone today.   Follow up with Dr. Rodena Medin in 1 week.

## 2011-12-23 NOTE — Assessment & Plan Note (Addendum)
CXR is performed today and is negative for infiltrate. He completed a Zpak recently. Active wheezing/cough.  Recommended prednisone taper, albuterol mdi every 6 hours for next few days.  I stressed the importance of smoking cessation with the patient today.  He is very concerned about the possibility of COPD or another lung disorder such as alpha-antitrypsin deficiency- we discussed the possibility of additional work up when his lungs have had time to heal from this exacerbation.  I have asked him to schedule a 1 week follow up with Dr. Rodena Medin.

## 2011-12-23 NOTE — Progress Notes (Signed)
Subjective:    Patient ID: Tony Davila, male    DOB: 1981-04-04, 31 y.o.   MRN: [id number]  HPI  Tony Davila is a 31 yr old male who presents today with chief complaint of cough.  Symptoms started 1.5-2 weeks ago.  Cough is keeping him up at night.  Cough is dry.   He notes + sinus congestion- "lots" brown/yellow nasal discharge.  He denies associated fever.  Notes mild Ha's that he believes was due to coughing.  He was treated with a Z-pak, albuterol, and a cough syrup.  He reports some improvement, but not resolution of his symptoms.      Review of Systems See HPI  Past Medical History  Diagnosis Date  . 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 Depression   . Kidney stone   . Anemia   . GERD (gastroesophageal reflux disease)   . Hypertension     not tx for now  . Hiatal hernia   . Jejunal ulcer 2009    anastomotic    History   Social History  . Marital Status: Married    Spouse Name: N/A    Number of Children: 0  . Years of Education: N/A   Occupational History  .     Social History Main Topics  . Smoking status: Current Everyday Smoker -- 12 years    Types: Cigarettes  . Smokeless tobacco: Never Used   Comment: less than 1/2 ppd.  . Alcohol Use: No  . Drug Use: No  . Sexually Active: Not on file   Other Topics Concern  . Not on file   Social History Narrative  . No narrative on file    Past Surgical History  Procedure Date  . Tonsillectomy   . Exploratory laparotomy   . Gastric bypass 2009    Dr. Johna Sheriff  . Upper gastrointestinal endoscopy 02/19/11    normal post-op exam    Family History  Problem Relation Age of Onset  . Heart disease Maternal Grandmother   . Hyperlipidemia Maternal Grandmother   . Hypertension Maternal Grandmother   . Mental illness Maternal Grandmother   . Irritable bowel syndrome Father   . Irritable bowel syndrome Brother   . Colon cancer Neg Hx     No Known Allergies  Current Outpatient  Prescriptions on File Prior to Visit  Medication Sig Dispense Refill  . calcium gluconate 500 MG tablet Take 500 mg by mouth daily.        Jennette Banker Sodium 30-100 MG CAPS Take by mouth daily.        . cetirizine (ZYRTEC) 10 MG tablet Take 10 mg by mouth daily.        . Cyanocobalamin (VITAMIN B 12 PO) Take 1,000 mcg by mouth.        . desvenlafaxine (PRISTIQ) 100 MG 24 hr tablet Take 1 tablet (100 mg total) by mouth daily.  30 tablet  6  . IRON PO Take 65 mg by mouth daily. 2 tab daily       . Multiple Vitamin (MULTIVITAMIN) tablet Take 1 tablet by mouth daily.        Marland Kitchen omeprazole (PRILOSEC) 40 MG capsule Take 1 capsule (40 mg total) by mouth daily. Take 30 minutes before breakfast  30 capsule  11  . topiramate (TOPAMAX) 50 MG tablet Take 1 tablet (50 mg total) by mouth 2 (two) times daily.  60 tablet  5    BP  116/70  Pulse 69  Temp(Src) 97.7 F (36.5 C) (Oral)  Resp 16  SpO2 96%       Objective:   Physical Exam  Constitutional: He appears well-developed and well-nourished. No distress.  Cardiovascular: Normal rate and regular rhythm.   No murmur heard. Pulmonary/Chest: Effort normal. No respiratory distress. He has wheezes.       Few crackles at left base.    Skin: Skin is warm and dry. No erythema.  Psychiatric: He has a normal mood and affect. His behavior is normal. Judgment and thought content normal.          Assessment & Plan:

## 2011-12-23 NOTE — Telephone Encounter (Signed)
Pls call pt and let him know that his x-ray is negative for pneumonia. He should complete prednisone taper, continue albuterol inhaler every 6 hours.  Call if symptoms worsen, or if not feeling better in 2-3 days.  No need for further abx at this time.

## 2011-12-27 ENCOUNTER — Ambulatory Visit (INDEPENDENT_AMBULATORY_CARE_PROVIDER_SITE_OTHER): Payer: BC Managed Care – PPO | Admitting: Internal Medicine

## 2011-12-27 ENCOUNTER — Encounter: Payer: Self-pay | Admitting: Internal Medicine

## 2011-12-27 VITALS — BP 122/60 | HR 85 | Temp 97.5°F | Resp 20

## 2011-12-27 DIAGNOSIS — F4321 Adjustment disorder with depressed mood: Secondary | ICD-10-CM

## 2011-12-27 DIAGNOSIS — J45901 Unspecified asthma with (acute) exacerbation: Secondary | ICD-10-CM

## 2011-12-27 DIAGNOSIS — F17200 Nicotine dependence, unspecified, uncomplicated: Secondary | ICD-10-CM

## 2011-12-27 MED ORDER — ALBUTEROL SULFATE 0.63 MG/3ML IN NEBU
0.6300 mg | INHALATION_SOLUTION | Freq: Once | RESPIRATORY_TRACT | Status: AC
  Administered 2011-12-27: 0.63 mg via RESPIRATORY_TRACT

## 2011-12-27 MED ORDER — LEVOFLOXACIN 500 MG PO TABS: 500.0000 mg | ORAL_TABLET | Freq: Every day | ORAL | Status: AC

## 2011-12-27 MED ORDER — HYDROCOD POLST-CHLORPHEN POLST 10-8 MG/5ML PO LQCR: 5.0000 mL | Freq: Two times a day (BID) | ORAL | Status: DC | PRN

## 2012-01-05 DIAGNOSIS — F4321 Adjustment disorder with depressed mood: Secondary | ICD-10-CM | POA: Insufficient documentation

## 2012-01-05 NOTE — Assessment & Plan Note (Signed)
Sample of advair 100 given-rinse mouth after use. Attempt levaquin. Given albuterol neb in clinic. tussionex prn cough. Recommend pft when returned to baseline. Followup if no improvement or worsening.

## 2012-01-05 NOTE — Assessment & Plan Note (Signed)
Understands need for cessation

## 2012-01-05 NOTE — Progress Notes (Signed)
  Subjective:    Patient ID: Tony Davila, male    DOB: [birth year], 31 y.o.   MRN: [id number]  HPI Pt presents to clinic for evaluation of cough. Notes cough productive for yellow/brown sputum without hemoptysis. Cough worse with movement. +tobacco hx. cxr 1/28 did not show infiltrate but suggested hyperinflation. Using albuterol mdi prn and prednisone. States depressed mood due to grandmother being placed in hospice. No si/hi.   Past Medical History  Diagnosis Date  . 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 Depression   . Kidney stone   . Anemia   . GERD (gastroesophageal reflux disease)   . Hypertension     not tx for now  . Hiatal hernia   . Jejunal ulcer 2009    anastomotic   Past Surgical History  Procedure Date  . Tonsillectomy   . Exploratory laparotomy   . Gastric bypass 2009    Dr. Johna Sheriff  . Upper gastrointestinal endoscopy 02/19/11    normal post-op exam    reports that he has been smoking Cigarettes.  He has smoked for the past 12 years. He has never used smokeless tobacco. He reports that he does not drink alcohol or use illicit drugs. family history includes Heart disease in his maternal grandmother; Hyperlipidemia in his maternal grandmother; Hypertension in his maternal grandmother; Irritable bowel syndrome in his brother and father; and Mental illness in his maternal grandmother.  There is no history of Colon cancer. No Known Allergies   Review of Systems see hpi     Objective:   Physical Exam  Nursing note and vitals reviewed. Constitutional: He appears well-developed and well-nourished. No distress.  HENT:  Head: Normocephalic and atraumatic.  Right Ear: External ear normal.  Left Ear: External ear normal.  Nose: Nose normal.  Mouth/Throat: Oropharynx is clear and moist.  Eyes: Conjunctivae are normal. No scleral icterus.  Neck: Neck supple.  Cardiovascular: Normal rate, regular rhythm and normal heart sounds.  Exam reveals  no gallop and no friction rub.   No murmur heard. Pulmonary/Chest: Effort normal and breath sounds normal. No respiratory distress. He has no wheezes. He has no rales.  Lymphadenopathy:    He has no cervical adenopathy.  Neurological: He is alert.  Skin: Skin is warm and dry. He is not diaphoretic.  Psychiatric: He has a normal mood and affect.          Assessment & Plan:

## 2012-01-05 NOTE — Assessment & Plan Note (Signed)
Referral to therapist

## 2012-01-17 ENCOUNTER — Encounter (HOSPITAL_COMMUNITY): Payer: Self-pay | Admitting: Anesthesiology

## 2012-01-17 ENCOUNTER — Emergency Department (INDEPENDENT_AMBULATORY_CARE_PROVIDER_SITE_OTHER): Payer: BC Managed Care – PPO

## 2012-01-17 ENCOUNTER — Encounter (HOSPITAL_BASED_OUTPATIENT_CLINIC_OR_DEPARTMENT_OTHER): Payer: Self-pay | Admitting: *Deleted

## 2012-01-17 ENCOUNTER — Other Ambulatory Visit: Payer: Self-pay

## 2012-01-17 ENCOUNTER — Inpatient Hospital Stay (HOSPITAL_BASED_OUTPATIENT_CLINIC_OR_DEPARTMENT_OTHER)
Admission: EM | Admit: 2012-01-17 | Discharge: 2012-01-24 | DRG: 148 | Disposition: A | Discharge status: Home / Self Care | Payer: BC Managed Care – PPO | Attending: Surgery | Admitting: Surgery

## 2012-01-17 ENCOUNTER — Ambulatory Visit: Admit: 2012-01-17 | Payer: Self-pay | Admitting: Surgery

## 2012-01-17 ENCOUNTER — Encounter (HOSPITAL_COMMUNITY): Admission: EM | Disposition: A | Discharge status: Home / Self Care | Payer: Self-pay | Source: Home / Self Care

## 2012-01-17 ENCOUNTER — Emergency Department (HOSPITAL_COMMUNITY): Payer: BC Managed Care – PPO | Admitting: Anesthesiology

## 2012-01-17 DIAGNOSIS — K285 Chronic or unspecified gastrojejunal ulcer with perforation: Principal | ICD-10-CM | POA: Diagnosis present

## 2012-01-17 DIAGNOSIS — R109 Unspecified abdominal pain: Secondary | ICD-10-CM

## 2012-01-17 DIAGNOSIS — Z9884 Bariatric surgery status: Secondary | ICD-10-CM

## 2012-01-17 DIAGNOSIS — K668 Other specified disorders of peritoneum: Secondary | ICD-10-CM

## 2012-01-17 DIAGNOSIS — F17200 Nicotine dependence, unspecified, uncomplicated: Secondary | ICD-10-CM | POA: Diagnosis present

## 2012-01-17 DIAGNOSIS — K250 Acute gastric ulcer with hemorrhage: Secondary | ICD-10-CM

## 2012-01-17 DIAGNOSIS — R079 Chest pain, unspecified: Secondary | ICD-10-CM

## 2012-01-17 DIAGNOSIS — K449 Diaphragmatic hernia without obstruction or gangrene: Secondary | ICD-10-CM | POA: Diagnosis present

## 2012-01-17 DIAGNOSIS — K219 Gastro-esophageal reflux disease without esophagitis: Secondary | ICD-10-CM | POA: Diagnosis present

## 2012-01-17 DIAGNOSIS — I10 Essential (primary) hypertension: Secondary | ICD-10-CM | POA: Diagnosis present

## 2012-01-17 DIAGNOSIS — K560 Paralytic ileus: Secondary | ICD-10-CM | POA: Diagnosis not present

## 2012-01-17 DIAGNOSIS — M25519 Pain in unspecified shoulder: Secondary | ICD-10-CM

## 2012-01-17 HISTORY — PX: LAPAROSCOPY: SHX197

## 2012-01-17 LAB — CBC
HCT: 43.3 % (ref 39.0–52.0)
Hemoglobin: 15.5 g/dL (ref 13.0–17.0)
MCH: 31.8 pg (ref 26.0–34.0)
MCHC: 35.8 g/dL (ref 30.0–36.0)
MCV: 88.9 fL (ref 78.0–100.0)
RBC: 4.87 MIL/uL (ref 4.22–5.81)

## 2012-01-17 LAB — LIPASE, BLOOD: Lipase: 19 U/L (ref 11–59)

## 2012-01-17 LAB — COMPREHENSIVE METABOLIC PANEL
ALT: 11 U/L (ref 0–53)
BUN: 10 mg/dL (ref 6–23)
CO2: 27 mEq/L (ref 19–32)
Calcium: 10 mg/dL (ref 8.4–10.5)
Creatinine, Ser: 0.6 mg/dL (ref 0.50–1.35)
GFR calc Af Amer: >90 mL/min (ref >90)
GFR calc non Af Amer: >90 mL/min (ref >90)
Glucose, Bld: 99 mg/dL (ref 70–99)
Sodium: 139 mEq/L (ref 135–145)
Total Protein: 6.8 g/dL (ref 6.0–8.3)

## 2012-01-17 SURGERY — LAPAROSCOPY, DIAGNOSTIC
Anesthesia: General | Site: Abdomen | Wound class: Dirty or Infected

## 2012-01-17 MED ORDER — MORPHINE SULFATE (PF) 1 MG/ML IV SOLN
INTRAVENOUS | Status: AC
  Filled 2012-01-17: qty 25

## 2012-01-17 MED ORDER — LACTATED RINGERS IV SOLN
INTRAVENOUS | Status: DC | PRN
  Administered 2012-01-17 (×3): via INTRAVENOUS

## 2012-01-17 MED ORDER — PROPOFOL 10 MG/ML IV BOLUS
INTRAVENOUS | Status: DC | PRN
  Administered 2012-01-17: 180 mg via INTRAVENOUS
  Administered 2012-01-17: 100 mg via INTRAVENOUS

## 2012-01-17 MED ORDER — ONDANSETRON HCL 4 MG/2ML IJ SOLN
4.0000 mg | Freq: Once | INTRAMUSCULAR | Status: AC
  Administered 2012-01-17: 4 mg via INTRAVENOUS
  Filled 2012-01-17: qty 2

## 2012-01-17 MED ORDER — ROCURONIUM BROMIDE 100 MG/10ML IV SOLN
INTRAVENOUS | Status: DC | PRN
  Administered 2012-01-17: 25 mg via INTRAVENOUS
  Administered 2012-01-17 (×2): 5 mg via INTRAVENOUS

## 2012-01-17 MED ORDER — HYDROMORPHONE HCL PF 1 MG/ML IJ SOLN
1.0000 mg | Freq: Once | INTRAMUSCULAR | Status: AC
  Administered 2012-01-17: 1 mg via INTRAVENOUS
  Filled 2012-01-17: qty 1

## 2012-01-17 MED ORDER — HYDROMORPHONE HCL PF 1 MG/ML IJ SOLN
1.0000 mg | Freq: Once | INTRAMUSCULAR | Status: AC
  Administered 2012-01-17: 1 mg via INTRAVENOUS

## 2012-01-17 MED ORDER — IOHEXOL 300 MG/ML  SOLN: 100.0000 mL | Freq: Once | INTRAMUSCULAR | Status: AC | PRN

## 2012-01-17 MED ORDER — ONDANSETRON HCL 4 MG/2ML IJ SOLN
INTRAMUSCULAR | Status: DC | PRN
  Administered 2012-01-17: 4 mg via INTRAVENOUS

## 2012-01-17 MED ORDER — MIDAZOLAM HCL 5 MG/5ML IJ SOLN
INTRAMUSCULAR | Status: DC | PRN
  Administered 2012-01-17: 2 mg via INTRAVENOUS

## 2012-01-17 MED ORDER — SODIUM CHLORIDE 0.9 % IV BOLUS (SEPSIS)
1000.0000 mL | Freq: Once | INTRAVENOUS | Status: AC
  Administered 2012-01-17: 1000 mL via INTRAVENOUS

## 2012-01-17 MED ORDER — SUCCINYLCHOLINE CHLORIDE 20 MG/ML IJ SOLN
INTRAMUSCULAR | Status: DC | PRN
  Administered 2012-01-17: 100 mg via INTRAVENOUS

## 2012-01-17 MED ORDER — FIBRIN SEALANT COMPONENT 5 ML EX KIT
PACK | CUTANEOUS | Status: AC
  Filled 2012-01-17: qty 2

## 2012-01-17 MED ORDER — FENTANYL CITRATE 0.05 MG/ML IJ SOLN
INTRAMUSCULAR | Status: DC | PRN
  Administered 2012-01-17: 100 ug via INTRAVENOUS
  Administered 2012-01-17 (×3): 50 ug via INTRAVENOUS

## 2012-01-17 MED ORDER — LACTATED RINGERS IR SOLN
Status: DC | PRN
  Administered 2012-01-17: 6000 mL

## 2012-01-17 MED ORDER — BUPIVACAINE HCL (PF) 0.25 % IJ SOLN
INTRAMUSCULAR | Status: DC | PRN
  Administered 2012-01-17: 20 mL

## 2012-01-17 MED ORDER — NEOSTIGMINE METHYLSULFATE 1 MG/ML IJ SOLN
INTRAMUSCULAR | Status: DC | PRN
  Administered 2012-01-17: 4 mg via INTRAVENOUS

## 2012-01-17 MED ORDER — GLYCOPYRROLATE 0.2 MG/ML IJ SOLN
INTRAMUSCULAR | Status: DC | PRN
  Administered 2012-01-17: .6 mg via INTRAVENOUS

## 2012-01-17 MED ORDER — HYDROMORPHONE HCL PF 1 MG/ML IJ SOLN
INTRAMUSCULAR | Status: DC | PRN
  Administered 2012-01-17 (×2): 1 mg via INTRAVENOUS

## 2012-01-17 MED ORDER — LACTATED RINGERS IV SOLN: INTRAVENOUS | Status: DC

## 2012-01-17 MED ORDER — IOHEXOL 300 MG/ML  SOLN
20.0000 mL | INTRAMUSCULAR | Status: AC
  Administered 2012-01-17: 20 mL via ORAL

## 2012-01-17 MED ORDER — LIDOCAINE HCL (CARDIAC) 20 MG/ML IV SOLN
INTRAVENOUS | Status: DC | PRN
  Administered 2012-01-17: 100 mg via INTRAVENOUS

## 2012-01-17 MED ORDER — HYDROMORPHONE HCL PF 1 MG/ML IJ SOLN
INTRAMUSCULAR | Status: AC
  Filled 2012-01-17: qty 1

## 2012-01-17 MED ORDER — 0.9 % SODIUM CHLORIDE (POUR BTL) OPTIME
TOPICAL | Status: DC | PRN
  Administered 2012-01-17: 1000 mL

## 2012-01-17 MED ORDER — SODIUM CHLORIDE 0.9 % IV BOLUS (SEPSIS)
500.0000 mL | Freq: Once | INTRAVENOUS | Status: AC
  Administered 2012-01-17: 500 mL via INTRAVENOUS

## 2012-01-17 MED ORDER — ACETAMINOPHEN 10 MG/ML IV SOLN
INTRAVENOUS | Status: AC
  Filled 2012-01-17: qty 100

## 2012-01-17 MED ORDER — PIPERACILLIN-TAZOBACTAM 3.375 G IVPB
3.3750 g | Freq: Once | INTRAVENOUS | Status: AC
  Administered 2012-01-17: 3.375 g via INTRAVENOUS
  Filled 2012-01-17: qty 50

## 2012-01-17 MED ORDER — MORPHINE SULFATE (PF) 1 MG/ML IV SOLN
INTRAVENOUS | Status: DC
  Administered 2012-01-17: 23:00:00 via INTRAVENOUS
  Administered 2012-01-18: 24 mg via INTRAVENOUS
  Administered 2012-01-18: 21:00:00 via INTRAVENOUS
  Administered 2012-01-18: 14.1 mg via INTRAVENOUS
  Administered 2012-01-18: 6 mg via INTRAVENOUS
  Administered 2012-01-18: 13.5 mg via INTRAVENOUS
  Administered 2012-01-18: 14:00:00 via INTRAVENOUS
  Administered 2012-01-19: 13.69 mg via INTRAVENOUS
  Administered 2012-01-19: 23:00:00 via INTRAVENOUS
  Administered 2012-01-19: 25 mg via INTRAVENOUS
  Administered 2012-01-19: 21.5 mg via INTRAVENOUS
  Administered 2012-01-19: 20.93 mg via INTRAVENOUS
  Administered 2012-01-19: 16.51 mg via INTRAVENOUS
  Administered 2012-01-19: 22.09 mg via INTRAVENOUS
  Administered 2012-01-19: 17:00:00 via INTRAVENOUS
  Administered 2012-01-19: 21.86 mg via INTRAVENOUS
  Administered 2012-01-20: 21.35 mg via INTRAVENOUS
  Administered 2012-01-20: 19.5 mg via INTRAVENOUS
  Administered 2012-01-20: 05:00:00 via INTRAVENOUS
  Administered 2012-01-20: 22 mg via INTRAVENOUS
  Administered 2012-01-20: 20.46 mg via INTRAVENOUS
  Administered 2012-01-20: 24.73 mg via INTRAVENOUS
  Administered 2012-01-20: 10.5 mg via INTRAVENOUS
  Administered 2012-01-20: 16:00:00 via INTRAVENOUS
  Administered 2012-01-21: 15 mg via INTRAVENOUS
  Administered 2012-01-21 (×2): via INTRAVENOUS
  Administered 2012-01-21: 18.35 mg via INTRAVENOUS
  Administered 2012-01-21: via INTRAVENOUS
  Administered 2012-01-21: 23.17 mg via INTRAVENOUS
  Administered 2012-01-21: 25 mg via INTRAVENOUS
  Administered 2012-01-21 – 2012-01-22 (×3): via INTRAVENOUS
  Administered 2012-01-22: 33.1 mg via INTRAVENOUS
  Administered 2012-01-22: 6 mg via INTRAVENOUS
  Administered 2012-01-22: 13:00:00 via INTRAVENOUS
  Administered 2012-01-22: 33 mg via INTRAVENOUS
  Administered 2012-01-22: 25 mg via INTRAVENOUS
  Administered 2012-01-23: 20.51 mg via INTRAVENOUS
  Administered 2012-01-23 (×2): via INTRAVENOUS
  Administered 2012-01-23: 17.93 mg via INTRAVENOUS
  Administered 2012-01-23: 25 mg via INTRAVENOUS
  Administered 2012-01-23: 13:00:00 via INTRAVENOUS
  Administered 2012-01-23: 7.5 mg via INTRAVENOUS
  Filled 2012-01-17 (×27): qty 25

## 2012-01-17 MED ORDER — PIPERACILLIN-TAZOBACTAM 3.375 G IVPB
3.3750 g | Freq: Three times a day (TID) | INTRAVENOUS | Status: DC
  Administered 2012-01-18 – 2012-01-24 (×20): 3.375 g via INTRAVENOUS
  Filled 2012-01-17 (×23): qty 50

## 2012-01-17 MED ORDER — HYDROMORPHONE HCL PF 1 MG/ML IJ SOLN: 0.2500 mg | INTRAMUSCULAR | Status: DC | PRN

## 2012-01-17 MED ORDER — ACETAMINOPHEN 10 MG/ML IV SOLN
INTRAVENOUS | Status: DC | PRN
  Administered 2012-01-17: 1000 mg via INTRAVENOUS

## 2012-01-17 MED ORDER — MEPERIDINE HCL 25 MG/ML IJ SOLN: 6.2500 mg | INTRAMUSCULAR | Status: DC | PRN

## 2012-01-17 MED ORDER — PROMETHAZINE HCL 25 MG/ML IJ SOLN: 6.2500 mg | INTRAMUSCULAR | Status: DC | PRN

## 2012-01-17 MED ORDER — BUPIVACAINE HCL (PF) 0.25 % IJ SOLN
INTRAMUSCULAR | Status: AC
  Filled 2012-01-17: qty 60

## 2012-01-17 SURGICAL SUPPLY — 85 items
APPLICATOR COTTON TIP 6IN STRL (MISCELLANEOUS) IMPLANT
APPLIER CLIP ROT 13.4 12 LRG (CLIP)
BLADE SURG 15 STRL LF DISP TIS (BLADE) ×2 IMPLANT
BLADE SURG 15 STRL SS (BLADE) ×1
CABLE HIGH FREQUENCY MONO STRZ (ELECTRODE) IMPLANT
CANISTER SUCTION 2500CC (MISCELLANEOUS) ×3 IMPLANT
CLIP APPLIE ROT 13.4 12 LRG (CLIP) IMPLANT
CLIP SUT LAPRA TY ABSORB (SUTURE) IMPLANT
CLOTH BEACON ORANGE TIMEOUT ST (SAFETY) ×3 IMPLANT
CUTTER LINEAR ENDO ART 45 ETS (STAPLE) ×3 IMPLANT
DECANTER SPIKE VIAL GLASS SM (MISCELLANEOUS) IMPLANT
DERMABOND ADVANCED (GAUZE/BANDAGES/DRESSINGS) ×2
DERMABOND ADVANCED .7 DNX12 (GAUZE/BANDAGES/DRESSINGS) ×4 IMPLANT
DEVICE SUTURE ENDOST 10MM (ENDOMECHANICALS) IMPLANT
DISSECTOR BLUNT TIP ENDO 5MM (MISCELLANEOUS) IMPLANT
DRAIN CHANNEL 19F RND (DRAIN) ×3 IMPLANT
DRAIN PENROSE 18X1/4 LTX STRL (WOUND CARE) IMPLANT
DRAPE CAMERA CLOSED 9X96 (DRAPES) ×3 IMPLANT
DUPLOJECT EASY PREP 4ML (MISCELLANEOUS) IMPLANT
EVACUATOR SILICONE 100CC (DRAIN) ×3 IMPLANT
GAUZE SPONGE 4X4 16PLY XRAY LF (GAUZE/BANDAGES/DRESSINGS) ×3 IMPLANT
GLOVE BIOGEL PI IND STRL 7.0 (GLOVE) ×2 IMPLANT
GLOVE BIOGEL PI INDICATOR 7.0 (GLOVE) ×1
GLOVE SURG SIGNA 7.5 PF LTX (GLOVE) ×3 IMPLANT
GLOVE SURG SS PI 6.5 STRL IVOR (GLOVE) ×3 IMPLANT
GLOVE SURG SS PI 7.5 STRL IVOR (GLOVE) ×6 IMPLANT
GLOVE SURG SS PI 8.0 STRL IVOR (GLOVE) ×3 IMPLANT
GLOVE SURG SS PI 8.5 STRL IVOR (GLOVE) ×1
GLOVE SURG SS PI 8.5 STRL STRW (GLOVE) ×2 IMPLANT
GOWN STRL NON-REIN LRG LVL3 (GOWN DISPOSABLE) ×3 IMPLANT
GOWN STRL REIN XL XLG (GOWN DISPOSABLE) ×9 IMPLANT
HOVERMATT SINGLE USE (MISCELLANEOUS) IMPLANT
IV LACTATED RINGER IRRG 3000ML (IV SOLUTION) ×2
IV LR IRRIG 3000ML ARTHROMATIC (IV SOLUTION) ×4 IMPLANT
KIT BASIN OR (CUSTOM PROCEDURE TRAY) ×3 IMPLANT
KIT GASTRIC LAVAGE 34FR ADT (SET/KITS/TRAYS/PACK) IMPLANT
NEEDLE SPNL 22GX3.5 QUINCKE BK (NEEDLE) ×3 IMPLANT
NS IRRIG 1000ML POUR BTL (IV SOLUTION) ×3 IMPLANT
PACK CARDIOVASCULAR III (CUSTOM PROCEDURE TRAY) ×3 IMPLANT
PEN SKIN MARKING BROAD (MISCELLANEOUS) ×3 IMPLANT
POUCH SPECIMEN RETRIEVAL 10MM (ENDOMECHANICALS) IMPLANT
RELOAD 45 VASCULAR/THIN (ENDOMECHANICALS) IMPLANT
RELOAD BLUE (STAPLE) IMPLANT
RELOAD ENDO STITCH 2.0 (ENDOMECHANICALS)
RELOAD GOLD (STAPLE) IMPLANT
RELOAD STAPLE TA45 3.5 REG BLU (ENDOMECHANICALS) IMPLANT
RELOAD WHITE ECR60W (STAPLE) IMPLANT
SCALPEL HARMONIC ACE (MISCELLANEOUS) ×3 IMPLANT
SCISSORS LAP 5X35 DISP (ENDOMECHANICALS) IMPLANT
SEALANT SURGICAL APPL DUAL CAN (MISCELLANEOUS) IMPLANT
SET IRRIG TUBING LAPAROSCOPIC (IRRIGATION / IRRIGATOR) ×3 IMPLANT
SLEEVE XCEL OPT CAN 5 100 (ENDOMECHANICALS) ×3 IMPLANT
SLEEVE Z-THREAD 12X100MM (TROCAR) ×6 IMPLANT
SLEEVE Z-THREAD 5X100MM (TROCAR) IMPLANT
SOLUTION ANTI FOG 6CC (MISCELLANEOUS) ×3 IMPLANT
SPONGE DRAIN TRACH 4X4 STRL 2S (GAUZE/BANDAGES/DRESSINGS) ×3 IMPLANT
SPONGE GAUZE 4X4 12PLY (GAUZE/BANDAGES/DRESSINGS) IMPLANT
STAPLER STANDARD HANDLE (STAPLE) IMPLANT
STAPLER VISISTAT 35W (STAPLE) ×3 IMPLANT
SUT ETHILON 2 0 PS N (SUTURE) ×3 IMPLANT
SUT MON AB 5-0 PS2 18 (SUTURE) ×3 IMPLANT
SUT RELOAD ENDO STITCH 2 48X1 (ENDOMECHANICALS)
SUT RELOAD ENDO STITCH 2.0 (ENDOMECHANICALS)
SUT SILK 2 0 SH (SUTURE) ×6 IMPLANT
SUT VIC AB 2-0 SH 27 (SUTURE) ×1
SUT VIC AB 2-0 SH 27X BRD (SUTURE) ×2 IMPLANT
SUTURE RELOAD END STTCH 2 48X1 (ENDOMECHANICALS) IMPLANT
SUTURE RELOAD ENDO STITCH 2.0 (ENDOMECHANICALS) IMPLANT
SYR 20CC LL (SYRINGE) ×3 IMPLANT
SYR 50ML LL SCALE MARK (SYRINGE) ×3 IMPLANT
SYR CONTROL 10ML LL (SYRINGE) IMPLANT
TAPE CLOTH SURG 4X10 WHT LF (GAUZE/BANDAGES/DRESSINGS) ×3 IMPLANT
TOWEL OR 17X26 10 PK STRL BLUE (TOWEL DISPOSABLE) ×3 IMPLANT
TRAY FOLEY CATH 14FRSI W/METER (CATHETERS) ×3 IMPLANT
TROCAR XCEL 12X100 BLDLESS (ENDOMECHANICALS) IMPLANT
TROCAR XCEL BLUNT TIP 100MML (ENDOMECHANICALS) ×3 IMPLANT
TROCAR XCEL NON-BLD 11X100MML (ENDOMECHANICALS) ×3 IMPLANT
TROCAR XCEL NON-BLD 5MMX100MML (ENDOMECHANICALS) ×6 IMPLANT
TROCAR Z-THREAD FIOS 11X100 BL (TROCAR) IMPLANT
TROCAR Z-THREAD FIOS 12X100MM (TROCAR) IMPLANT
TROCAR Z-THREAD FIOS 5X100MM (TROCAR) IMPLANT
TUBING ENDO SMARTCAP (MISCELLANEOUS) ×3 IMPLANT
TUBING FILTER THERMOFLATOR (ELECTROSURGICAL) ×3 IMPLANT
WARMER LAPAROSCOPE (MISCELLANEOUS) IMPLANT
WATER STERILE IRR 1500ML POUR (IV SOLUTION) ×3 IMPLANT

## 2012-01-17 NOTE — ED Notes (Signed)
ZOX:[id number]<AV> Expected date:01/17/12<BR> Expected time: 6:45 PM<BR> Means of arrival:Ambulance<BR> Comments:<BR> Transfer from [facility]

## 2012-01-17 NOTE — ED Notes (Signed)
[facility]ia [facility] -spoke with Rhonda 

## 2012-01-17 NOTE — Anesthesia Preprocedure Evaluation (Signed)
Anesthesia Evaluation  Patient identified by MRN, date of birth, ID band Patient awake    Reviewed: Allergy & Precautions, H&P , NPO status , Patient's Chart, lab work & pertinent test results  Airway Mallampati: II TM Distance: >3 FB Neck ROM: Full    Dental No notable dental hx.    Pulmonary neg pulmonary ROS,  clear to auscultation  Pulmonary exam normal       Cardiovascular hypertension, neg cardio ROS Regular Normal    Neuro/Psych PSYCHIATRIC DISORDERS Depression Negative Neurological ROS  Negative Psych ROS   GI/Hepatic negative GI ROS, Neg liver ROS, hiatal hernia, PUD, GERD-  ,  Endo/Other  Negative Endocrine ROS  Renal/GU negative Renal ROS  Genitourinary negative   Musculoskeletal negative musculoskeletal ROS (+)   Abdominal   Peds negative pediatric ROS (+)  Hematology negative hematology ROS (+)   Anesthesia Other Findings   Reproductive/Obstetrics negative OB ROS                           Anesthesia Physical Anesthesia Plan  ASA: II  Anesthesia Plan: General   Post-op Pain Management:    Induction: Intravenous  Airway Management Planned: Oral ETT  Additional Equipment:   Intra-op Plan:   Post-operative Plan: Extubation in OR  Informed Consent: I have reviewed the patients History and Physical, chart, labs and discussed the procedure including the risks, benefits and alternatives for the proposed anesthesia with the patient or authorized representative who has indicated his/her understanding and acceptance.   Dental advisory given  Plan Discussed with: CRNA  Anesthesia Plan Comments:         Anesthesia Quick Evaluation

## 2012-01-17 NOTE — ED Notes (Signed)
Pt was sent to meet surgery for perforated gastric ulcer. Pt has HX of gastric bypass surgery. Pt's last PO intake was last PM. Denies nausea and vomiting. Pt has IV in place with NS KVO.

## 2012-01-17 NOTE — ED Notes (Signed)
Pt transferred to  OR.

## 2012-01-17 NOTE — ED Notes (Signed)
Pt reports that he had a sudden onset of upper abdominal pain and left shoulder pain.  Pt denies any associated trauma.  Pt denies any N/V/D

## 2012-01-17 NOTE — ED Notes (Signed)
Pt transferred from [facility] ED for perforated ulcer.

## 2012-01-17 NOTE — Anesthesia Postprocedure Evaluation (Signed)
  Anesthesia Post-op Note  Patient: Tony Davila  Procedure(s) Performed: Procedure(s) (LRB): LAPAROSCOPY DIAGNOSTIC (N/A)  Patient Location: PACU  Anesthesia Type: General  Level of Consciousness: awake and alert   Airway and Oxygen Therapy: Patient Spontanous Breathing  Post-op Pain: mild  Post-op Assessment: Post-op Vital signs reviewed, Patient's Cardiovascular Status Stable, Respiratory Function Stable, Patent Airway and No signs of Nausea or vomiting  Post-op Vital Signs: stable  Complications: No apparent anesthesia complications

## 2012-01-17 NOTE — Brief Op Note (Signed)
01/17/2012  10:19 PM  PATIENT:  Tony Davila, 31 y.o., male, MRN: [id number]  PREOP DIAGNOSIS:  perforated bowel  POSTOP DIAGNOSIS:   Perforation at gastro-jejunal anastomosis.  PROCEDURE:   Procedure(s):  LAPAROSCOPY DIAGNOSTIC, over sew perforation at gastrojejunostomy.  SURGEON:   Ovidio Kin, M.D.  ASSISTANTBiagio Quint, M.D.  ANESTHESIA:   general  Peggy Williford - CRNA Phillips Grout, MD - Anesthesiologist Edison Pace - CRNA  General  EBL:  <50  ml  BLOOD ADMINISTERED: none  DRAINS: 19 F Blake drain  LOCAL MEDICATIONS USED:   20 cc 1/4% marcaine  SPECIMEN:   none  COUNTS CORRECT:  YES  INDICATIONS FOR PROCEDURE:  Tony Davila is a 31 y.o. (DOB: 1981-06-28) white male whose primary care physician is Letitia Libra, Ala Dach, MD, MD and comes for exploration for pneumoperitoneum, most likely due to perforation at the gastrojejunostomy of a prior RYGB.   The indications and risks of the surgery were explained to the patient.  The risks include, but are not limited to, infection, bleeding, and nerve injury.  Note dictated to:   #[id number]

## 2012-01-17 NOTE — ED Notes (Signed)
ZOX:[id number]<WU> Expected date:01/17/12<BR> Expected time: 6:43 PM<BR> Means of arrival:Ambulance<BR> Comments:<BR> EMS 10 GC - fall

## 2012-01-17 NOTE — ED Notes (Signed)
Report called to [facility] ed to [employer] charge nurse

## 2012-01-17 NOTE — Transfer of Care (Signed)
Immediate Anesthesia Transfer of Care Note  Patient: Tony Davila  Procedure(s) Performed: Procedure(s) (LRB): LAPAROSCOPY DIAGNOSTIC (N/A)  Patient Location: PACU  Anesthesia Type: General  Level of Consciousness: awake and alert   Airway & Oxygen Therapy: Patient Spontanous Breathing and Patient connected to face mask oxygen  Post-op Assessment: Report given to PACU RN and Post -op Vital signs reviewed and stable  Post vital signs: Reviewed and stable  Complications: No apparent anesthesia complications

## 2012-01-17 NOTE — ED Provider Notes (Signed)
History     CSN: [id number]  Arrival date & time 01/17/12  1506   First MD Initiated Contact with Patient 01/17/12 1518      Chief Complaint  Patient presents with  . Abdominal Pain  . Shoulder Pain    (Consider location/radiation/quality/duration/timing/severity/associated sxs/prior treatment) The history is provided by the patient.   patient reports acute onset upper abdominal pain with radiation to his left shoulder.  He has had nausea without vomiting.  He denies diarrhea.  He reports he's had intermittent abdominal pain for approximately one month.  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  Back in July 2012 he was having abdominal pain was evaluated with an endoscopy in deemed to have a marginal ulcer.  He's been doing well since that until today.  She also has a history of depression kidney stones gastroesophageal reflux disease hypertension and a hiatal hernia.  Nothing worsens the symptoms.  Nothing improves his symptoms.  Symptoms are constant.  His pain is mild to moderate at this time.  He denies fevers or chills.  He reports a 10 pound weight loss over the past several weeks.  He's had no change in his appetite his had no decreased oral intake.  He denies melena or hematochezia.  He denies diarrhea.  Past Medical History  Diagnosis Date  . 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 Depression   . Kidney stone   . Anemia   . GERD (gastroesophageal reflux disease)   . Hypertension     not tx for now  . Hiatal hernia   . Jejunal ulcer 2009    anastomotic    Past Surgical History  Procedure Date  . Tonsillectomy   . Exploratory laparotomy   . Gastric bypass 2009    Dr. Johna Sheriff  . Upper gastrointestinal endoscopy 02/19/11    normal post-op exam    Family History  Problem Relation Age of Onset  . Heart disease Maternal Grandmother   . Hyperlipidemia Maternal Grandmother   . Hypertension Maternal Grandmother   .  Mental illness Maternal Grandmother   . Irritable bowel syndrome Father   . Irritable bowel syndrome Brother   . Colon cancer Neg Hx     History  Substance Use Topics  . Smoking status: Current Everyday Smoker -- 12 years    Types: Cigarettes  . Smokeless tobacco: Never Used   Comment: less than 1/2 ppd.  . Alcohol Use: No      Review of Systems  All other systems reviewed and are negative.    Allergies  Review of patient's allergies indicates no known allergies.  Home Medications   Current Outpatient Rx  Name Route Sig Dispense Refill  . ALBUTEROL SULFATE HFA 108 (90 BASE) MCG/ACT IN AERS Inhalation Inhale 2 puffs into the lungs 4 (four) times daily.    Marland Kitchen CALCIUM GLUCONATE 500 MG PO TABS Oral Take 500 mg by mouth daily.      Marland Kitchen CASANTHRANOL-DOCUSATE SODIUM 30-100 MG PO CAPS Oral Take by mouth daily.      Marland Kitchen CETIRIZINE HCL 10 MG PO TABS Oral Take 10 mg by mouth daily.      Marland Kitchen HYDROCOD POLST-CPM POLST ER 10-8 MG/5ML PO LQCR Oral Take 5 mLs by mouth every 12 (twelve) hours as needed. 140 mL 0  . VITAMIN B 12 PO Oral Take 1,000 mcg by mouth.      . DESVENLAFAXINE SUCCINATE ER 100 MG PO  TB24 Oral Take 1 tablet (100 mg total) by mouth daily. 30 tablet 6  . IRON PO Oral Take 65 mg by mouth daily. 2 tab daily     . ONE-DAILY MULTI VITAMINS PO TABS Oral Take 1 tablet by mouth daily.      Marland Kitchen OMEPRAZOLE 40 MG PO CPDR Oral Take 1 capsule (40 mg total) by mouth daily. Take 30 minutes before breakfast 30 capsule 11  . PREDNISONE 10 MG PO TABS  4 tabs by mouth daily for 2 days, ten 3 tabs daily for 2 days, then 2 tabs daily for 2 days, then 1 tab daily for 2 days then stop. 20 tablet 0  . TOPIRAMATE 50 MG PO TABS Oral Take 1 tablet (50 mg total) by mouth 2 (two) times daily. 60 tablet 5    BP 136/77  Pulse 74  Temp(Src) 97.5 F (36.4 C) (Oral)  Resp 18  SpO2 96%  Physical Exam  Nursing note and vitals reviewed. Constitutional: He is oriented to person, place, and time. He appears  well-developed and well-nourished.  HENT:  Head: Normocephalic and atraumatic.  Eyes: EOM are normal.  Neck: Normal range of motion.  Cardiovascular: Normal rate, regular rhythm, normal heart sounds and intact distal pulses.   Pulmonary/Chest: Effort normal and breath sounds normal. No respiratory distress.  Abdominal: Soft. He exhibits no distension.       Epigastric tenderness without guarding or rebound  Musculoskeletal: Normal range of motion.  Neurological: He is alert and oriented to person, place, and time.  Skin: Skin is warm and dry.  Psychiatric: He has a normal mood and affect. Judgment normal.    ED Course  Procedures (including critical care time)   Date: 01/17/2012  Rate: 59  Rhythm: normal sinus rhythm  QRS Axis: normal  Intervals: normal  ST/T Wave abnormalities: normal  Conduction Disutrbances: none  Narrative Interpretation:   Old EKG Reviewed: No significant changes noted   Labs Reviewed  COMPREHENSIVE METABOLIC PANEL - Abnormal; Notable for the following:    Alkaline Phosphatase 139 (*)    All other components within normal limits  CBC  LIPASE, BLOOD   Dg Chest 2 View  01/17/2012  *RADIOLOGY REPORT*  Clinical Data: Chest abdominal pain.  CHEST - 2 VIEW  Comparison: Chest x-ray 12/23/2011.  Findings: The cardiac silhouette, mediastinal and hilar contours are within normal limits and stable.  The lungs are clear.  No pleural effusion.  Findings suspicious for free air under the hemidiaphragm.  Recommend a left side down right side up decubitus abdominal film.  IMPRESSION:  1.  No acute cardiopulmonary findings. 2.  Possible free intraperitoneal air.  Recommend left side down, right side up decubitus abdominal film.  Findings called to Dr. Patria Mane  Original Report Authenticated By: P. Loralie Champagne, M.D.   Ct Abdomen Pelvis W Contrast  01/17/2012  *RADIOLOGY REPORT*  Clinical Data: 31 year old male with abdominal pelvic pain. History of gastric bypass.  CT  ABDOMEN AND PELVIS WITH CONTRAST  Technique:  Multidetector CT imaging of the abdomen and pelvis was performed following the standard protocol during bolus administration of intravenous contrast.  Contrast:  100 ml intravenous Omnipaque-300  Comparison: 05/13/2011  Findings: A moderate amount of pneumoperitoneum is identified, primarily within the upper abdomen and compatible with bowel perforation.  Although the source of this free air is not definitely identified, the bowel perforation is likely at or near the gastric bypass. A moderate amount of free fluid is identified, primarily within the  pelvis.  The liver, gallbladder, spleen, kidneys, gallbladder, pancreas and adrenal glands are unremarkable.  There is no evidence of biliary dilatation, enlarged lymph nodes or abdominal aortic aneurysm. The bladder is within normal limits.  No acute or suspicious bony abnormalities are identified.  IMPRESSION: Moderate pneumoperitoneum within the upper abdomen compatible with bowel perforation, which likely is at or near the gastric bypass.  Moderate free fluid in the pelvis.  These results were called to Dr. Patria Mane on 01/17/2012 at 5:10 p.m.  Original Report Authenticated By: Rosendo Gros, M.D.   I personally reviewed the CT scan and plain film and discussed the findings with the radiologist  1. Pneumoperitoneum       MDM  Patient with history of Roux-en-Y in 2009.  Now with sudden onset epigastric pain with radiation to his left shoulder.  EKG is normal sinus rhythm.  This could represent free air,  plain films being obtained.  He has had a history of marginal ulcer before in  the past.  If plain films negative he will require a CT scan to better evaluate.  IV fluids pain medicine and antiemetics at this time.  5:18 PM Intraabdominal free air. Spoke with Dr Dwain Sarna, [facility], who accepts the patient in transfer to the [facility] ER. Zosyn given. Pain treated. NPO. Pt and family informed        Lyanne Co, MD 01/17/12 [phone]

## 2012-01-17 NOTE — ED Notes (Signed)
Sent from [facility] d/t perforated bowel.  Awaiting Dr. Dwain Sarna for surgical consult.  138/83, 97, 16, 99% RA,  Morphine 4mg  IV given by [facility]

## 2012-01-17 NOTE — H&P (Signed)
Re:   Tony Davila DOB:   November 20, 1981 MRN:   [id number]  ASSESSMENT AND PLAN: 1.  Perforated bowel.  Probable perforated ulcer.  I discussed with him and his family the findings.  I discussed the risk of surgery, including, but not limited to, bleeding, infection, open surgery, and the possibility of post op complications.  He's got to quit smoking.  2.  History of jejunal ulcer.  3.  RYGB by Dr. Leonard Schwartz. [place].  04/30/2008 - he said he weighed 376 at the time of surgery, he is down to 160 pound now. 4.  Smokes.  Knows this is bad for his health. 5.  Depression. 6.  HH. 7.  Recent brochitis. 8.  History of migraine HA. 9.  Remote history of kidney stones.  Chief Complaint  Patient presents with  . Abdominal Pain  . Shoulder Pain   REFERRING PHYSICIAN: Estill Cotta, MD, MD  HISTORY OF PRESENT ILLNESS: Tony Davila is a 31 y.o. (DOB: 05/12/1981)  white male whose primary care physician is Letitia Libra, Ala Dach, MD, MD (he is now in [facility]) and was transferred from [facility] to [facility] with pnuemoperitoneum.  Patient had a RYGB by Dr. Johna Sheriff about 04/30/2008.  He said I explored him for a perforation in 11/08/2008. Abundio Miu, I laparoscoped him for an internal hernia. He did not have one. But on upper endo, he had a marginal ulcer.] He had a upper endo by Dr. Sharia Reeve in June of 2012, but he was unsure whether there was an ulcer found.  He knows that cigarette smoking is the primary cause of his ulcer disease.  He developed acute abdominal and left chest/shoulder pain today.  Then he went to the [facility] because of the pain.  WBC - 6,800. No history of liver disease.  No history of gall bladder disease.  No history of pancreas disease.  No history of colon disease.     Past Medical History  Diagnosis Date  . 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 Depression   . Kidney stone   . Anemia   . GERD  (gastroesophageal reflux disease)   . Hypertension     not tx for now  . Hiatal hernia   . Jejunal ulcer 2009    anastomotic      Past Surgical History  Procedure Date  . Tonsillectomy   . Exploratory laparotomy   . Gastric bypass 2009    Dr. Johna Sheriff  . Upper gastrointestinal endoscopy 02/19/11    normal post-op exam      Current Facility-Administered Medications  Medication Dose Route Frequency Provider Last Rate Last Dose  . HYDROmorphone (DILAUDID) injection 1 mg  1 mg Intravenous Once Lyanne Co, MD   1 mg at 01/17/12 1617  . HYDROmorphone (DILAUDID) injection 1 mg  1 mg Intravenous Once Lyanne Co, MD   1 mg at 01/17/12 1709  . iohexol (OMNIPAQUE) 300 MG/ML solution 100 mL  100 mL Intravenous Once PRN Medication Radiologist, MD      . iohexol (OMNIPAQUE) 300 MG/ML solution 20 mL  20 mL Oral Q1 Hr x 2 Medication Radiologist, MD   20 mL at 01/17/12 1610  . ondansetron (ZOFRAN) injection 4 mg  4 mg Intravenous Once Lyanne Co, MD   4 mg at 01/17/12 1617  . piperacillin-tazobactam (ZOSYN) IVPB 3.375 g  3.375 g Intravenous Once Vania Rea  Campos, MD   3.375 g at 01/17/12 1708  . sodium chloride 0.9 % bolus 1,000 mL  1,000 mL Intravenous Once Lyanne Co, MD   1,000 mL at 01/17/12 1612   Current Outpatient Prescriptions  Medication Sig Dispense Refill  . calcium gluconate 500 MG tablet Take 500 mg by mouth daily.        Jennette Banker Sodium 30-100 MG CAPS Take by mouth daily.        . Cyanocobalamin (VITAMIN B 12 PO) Take 1,000 mcg by mouth daily.       Marland Kitchen desvenlafaxine (PRISTIQ) 100 MG 24 hr tablet Take 1 tablet (100 mg total) by mouth daily.  30 tablet  6  . IRON PO Take 65 mg by mouth daily. 2 tab daily      . Multiple Vitamin (MULTIVITAMIN) tablet Take 1 tablet by mouth daily.        Marland Kitchen omeprazole (PRILOSEC) 40 MG capsule Take 1 capsule (40 mg total) by mouth daily. Take 30 minutes before breakfast  30 capsule  11  . topiramate (TOPAMAX) 50 MG tablet  Take 1 tablet (50 mg total) by mouth 2 (two) times daily.  60 tablet  5  . albuterol (VENTOLIN HFA) 108 (90 BASE) MCG/ACT inhaler Inhale 2 puffs into the lungs every 6 (six) hours as needed. As needed for shortness of breath.         No Known Allergies  REVIEW OF SYSTEMS: Skin:  No history of rash.  No history of abnormal moles. Infection:  No history of hepatitis or HIV.  No history of MRSA. Neurologic:   Has migraine HA's.  Using Topomax to control the symptoms. Cardiac:  No history of hypertension. No history of heart disease.  No history of prior cardiac catheterization.  No history of seeing a cardiologist. Pulmonary:  Smokes cigarettes - 1/2 to 1 PPD. Recent bronchitis - treated with Z pack/prednisone dose pack.  Endocrine:  No diabetes. No thyroid disease. Gastrointestinal:  See HPI. Urologic:  One time kidney stones about 7 years ago. Musculoskeletal:  No history of joint or back disease. Hematologic:  No bleeding disorder.  No history of anemia.  Not anticoagulated. Psycho-social:  The patient is oriented.   The patient has no obvious psychologic or social impairment to understanding our conversation and plan.  SOCIAL and FAMILY HISTORY: Married.  Wife, Tony Davila, at bedside.  Mom at bedside. Works at [employer], [employer]. Wants DNR if significantly ill.  Did not want his wife to know about this.  He discussed this with me in the [facility].  PHYSICAL EXAM: BP 115/49  Pulse 80  Temp(Src) 97.5 F (36.4 C) (Oral)  Resp 18  SpO2 99%  General: Thin WN WM who is alert and generally healthy appearing.  HEENT: Normal. Pupils equal. Good dentition. Neck: Supple. No mass.  No thyroid mass.  Carotid pulse okay with no bruit. Lymph Nodes:  No supraclavicular or cervical nodes. Lungs: Clear to auscultation and symmetric breath sounds. Heart:  RRR. No murmur or rub.  Abdomen: Soft. No mass Surprisingly little tenderness.  BS present, but decreased. Rectal: Not done. Extremities:  Good  strength and ROM  in upper and lower extremities. Neurologic:  Grossly intact to motor and sensory function. Psychiatric: Has normal mood and affect. Behavior is normal.   DATA REVIEWED: CT scan.  Labs.  Old history.  Ovidio Kin, MD,  [facility] [address]  [address]   [place], [place]  [address] Phone:  [phone] FAX:  [phone]

## 2012-01-18 LAB — DIFFERENTIAL
Basophils Absolute: 0 10*3/uL (ref 0.0–0.1)
Basophils Relative: 0 % (ref 0–1)
Monocytes Absolute: 0.7 10*3/uL (ref 0.1–1.0)
Neutro Abs: 8.4 10*3/uL — ABNORMAL HIGH (ref 1.7–7.7)
Neutrophils Relative %: 77 % (ref 43–77)

## 2012-01-18 LAB — BASIC METABOLIC PANEL
Chloride: 106 mEq/L (ref 96–112)
Creatinine, Ser: 0.67 mg/dL (ref 0.50–1.35)
GFR calc Af Amer: >90 mL/min (ref >90)
Sodium: 138 mEq/L (ref 135–145)

## 2012-01-18 LAB — CBC
HCT: 37.8 % — ABNORMAL LOW (ref 39.0–52.0)
MCHC: 34.4 g/dL (ref 30.0–36.0)
Platelets: 183 10*3/uL (ref 150–400)
RDW: 13 % (ref 11.5–15.5)
WBC: 10.9 10*3/uL — ABNORMAL HIGH (ref 4.0–10.5)

## 2012-01-18 LAB — MRSA PCR SCREENING: MRSA by PCR: NEGATIVE

## 2012-01-18 MED ORDER — NICOTINE 21 MG/24HR TD PT24
21.0000 mg | MEDICATED_PATCH | Freq: Every day | TRANSDERMAL | Status: DC
  Administered 2012-01-18 – 2012-01-24 (×7): 21 mg via TRANSDERMAL
  Filled 2012-01-18 (×7): qty 1

## 2012-01-18 MED ORDER — THIAMINE HCL 100 MG/ML IJ SOLN
100.0000 mg | Freq: Every day | INTRAMUSCULAR | Status: DC
  Administered 2012-01-18 – 2012-01-24 (×7): 100 mg via INTRAVENOUS
  Filled 2012-01-18 (×7): qty 1

## 2012-01-18 MED ORDER — HEPARIN SODIUM (PORCINE) 5000 UNIT/ML IJ SOLN
5000.0000 [IU] | Freq: Three times a day (TID) | INTRAMUSCULAR | Status: DC
  Administered 2012-01-18 – 2012-01-24 (×19): 5000 [IU] via SUBCUTANEOUS
  Filled 2012-01-18 (×23): qty 1

## 2012-01-18 MED ORDER — ONDANSETRON HCL 4 MG PO TABS: 4.0000 mg | ORAL_TABLET | Freq: Four times a day (QID) | ORAL | Status: DC | PRN

## 2012-01-18 MED ORDER — KCL IN DEXTROSE-NACL 20-5-0.45 MEQ/L-%-% IV SOLN
INTRAVENOUS | Status: AC
  Administered 2012-01-18: 1000 mL via INTRAVENOUS
  Filled 2012-01-18: qty 1000

## 2012-01-18 MED ORDER — LORAZEPAM 2 MG/ML IJ SOLN
0.5000 mg | Freq: Four times a day (QID) | INTRAMUSCULAR | Status: DC | PRN
  Administered 2012-01-18: 0.5 mg via INTRAVENOUS
  Filled 2012-01-18: qty 1

## 2012-01-18 MED ORDER — PANTOPRAZOLE SODIUM 40 MG IV SOLR
40.0000 mg | Freq: Two times a day (BID) | INTRAVENOUS | Status: DC
  Administered 2012-01-18 – 2012-01-24 (×14): 40 mg via INTRAVENOUS
  Filled 2012-01-18 (×15): qty 40

## 2012-01-18 MED ORDER — KCL IN DEXTROSE-NACL 20-5-0.45 MEQ/L-%-% IV SOLN
INTRAVENOUS | Status: DC
  Administered 2012-01-18: 02:00:00 via INTRAVENOUS
  Administered 2012-01-18 (×2): 1000 mL via INTRAVENOUS
  Administered 2012-01-19 – 2012-01-22 (×8): via INTRAVENOUS
  Filled 2012-01-18 (×19): qty 1000

## 2012-01-18 MED ORDER — ONDANSETRON HCL 4 MG/2ML IJ SOLN: 4.0000 mg | Freq: Four times a day (QID) | INTRAMUSCULAR | Status: DC | PRN

## 2012-01-18 MED ORDER — LORAZEPAM 2 MG/ML IJ SOLN
INTRAMUSCULAR | Status: AC
  Filled 2012-01-18: qty 1

## 2012-01-18 NOTE — Progress Notes (Signed)
General Surgery Note  LOS: 1 day  POD# 1 Zosyn #1  Assessment/Plan:  1.  Laparoscopic oversew of perforated gastrojejunal ulcer -  Looks surprisingly good.  Already wants to go home. To keep NPO until 2/27, then obtain Gastrograffin UGI. To recheck labs in AM.  Ambulate.  2. RYGB by Dr. Leonard Schwartz. [place] - 04/30/2008 - he said he weighed 376 at the time of surgery, he is down to about 160 pounds now.  3. Smokes. On nicoderm  I have gone over with him in detail the dangers of smoking after RYGB. 4. Depression.  5. HH.  6. Recent brochitis.  7. History of migraine HA.  8. Remote history of kidney stones. 9.  VTE - SQ heparin.  Subjective:  Feels okay.  Talking about going home. Objective:   Filed Vitals:   01/18/12 0400  BP: 100/52  Pulse: 59  Temp: 98 F (36.7 C)  Resp: 11     Intake/Output from previous day:  02/22 0701 - 02/23 0700 In: 3410 [I.V.:2700; IV Piggyback:560] Out: 1000 [Urine:1000]  Intake/Output this shift:      Physical Exam:   General: Thinner WN WM who is alert and oriented.    HEENT: Normal. Pupils equal. Good dentition. .   Lungs: Clear.  IS only 800 cc. Needs to work on this.   Abdomen: Soft. Quiet.   Wound: Dressing intact.   Neurologic:  Grossly intact to motor and sensory function.   Psychiatric: Has normal mood and affect. Behavior is normal   Lab Results:    Basename 01/18/12 0519 01/17/12 1610  WBC 10.9* 6.8  HGB 13.0 15.5  HCT 37.8* 43.3  PLT 183 287    BMET   Basename 01/18/12 0519 01/17/12 1610  NA 138 139  K 4.1 5.0  CL 106 103  CO2 27 27  GLUCOSE 97 99  BUN 8 10  CREATININE 0.67 0.60  CALCIUM 8.3* 10.0    PT/INR  No results found for this basename: LABPROT:2,INR:2 in the last 72 hours  ABG  No results found for this basename: PHART:2,PCO2:2,PO2:2,HCO3:2 in the last 72 hours   Studies/Results:  Dg Chest 2 View  01/17/2012  *RADIOLOGY REPORT*  Clinical Data: Chest abdominal pain.  CHEST - 2 VIEW  Comparison: Chest x-ray  12/23/2011.  Findings: The cardiac silhouette, mediastinal and hilar contours are within normal limits and stable.  The lungs are clear.  No pleural effusion.  Findings suspicious for free air under the hemidiaphragm.  Recommend a left side down right side up decubitus abdominal film.  IMPRESSION:  1.  No acute cardiopulmonary findings. 2.  Possible free intraperitoneal air.  Recommend left side down, right side up decubitus abdominal film.  Findings called to Dr. Patria Mane  Original Report Authenticated By: P. Loralie Champagne, M.D.   Ct Abdomen Pelvis W Contrast  01/17/2012  *RADIOLOGY REPORT*  Clinical Data: 31 year old male with abdominal pelvic pain. History of gastric bypass.  CT ABDOMEN AND PELVIS WITH CONTRAST  Technique:  Multidetector CT imaging of the abdomen and pelvis was performed following the standard protocol during bolus administration of intravenous contrast.  Contrast:  100 ml intravenous Omnipaque-300  Comparison: 05/13/2011  Findings: A moderate amount of pneumoperitoneum is identified, primarily within the upper abdomen and compatible with bowel perforation.  Although the source of this free air is not definitely identified, the bowel perforation is likely at or near the gastric bypass. A moderate amount of free fluid is identified, primarily within the pelvis.  The  liver, gallbladder, spleen, kidneys, gallbladder, pancreas and adrenal glands are unremarkable.  There is no evidence of biliary dilatation, enlarged lymph nodes or abdominal aortic aneurysm. The bladder is within normal limits.  No acute or suspicious bony abnormalities are identified.  IMPRESSION: Moderate pneumoperitoneum within the upper abdomen compatible with bowel perforation, which likely is at or near the gastric bypass.  Moderate free fluid in the pelvis.  These results were called to Dr. Patria Mane on 01/17/2012 at 5:10 p.m.  Original Report Authenticated By: Rosendo Gros, M.D.     Anti-infectives:   Anti-infectives      Start     Dose/Rate Route Frequency Ordered Stop   01/18/12 0000  piperacillin-tazobactam (ZOSYN) IVPB 3.375 g       3.375 g 12.5 mL/hr over 240 Minutes Intravenous Every 8 hours 01/17/12 2239     01/17/12 1715  piperacillin-tazobactam (ZOSYN) IVPB 3.375 g       3.375 g 12.5 mL/hr over 240 Minutes Intravenous  Once 01/17/12 1701 01/17/12 2108          Ovidio Kin, MD, FACS Pager: [phone],   [facility] [place] [facility] Office: [phone] 01/18/2012

## 2012-01-18 NOTE — Op Note (Signed)
NAMEERNESTINE, Tony Davila NO.:  [id number]  MEDICAL RECORD NO.:  [id number]  LOCATION:  1240                         FACILITY:  [facility]  PHYSICIAN:  Sandria Bales. Ezzard Standing, M.D.  DATE OF BIRTH:  12-Nov-1981  DATE OF PROCEDURE:  01/17/2012                              OPERATIVE REPORT   PREOPERATIVE DIAGNOSES:  Pneumoperitoneum, probable perforation, and gastrojejunostomy.  POSTOPERATIVE DIAGNOSIS:  Perforation of gastrojejunostomy.  PROCEDURE:  Laparoscopic exploration with oversew of perforation, gastrojejunostomy with omental patch.  SURGEON:  Sandria Bales. Ezzard Standing, M.D.  FIRST ASSISTANT:  Lodema Pilot, MD.  ANESTHESIA:  General endotracheal.  ESTIMATED BLOOD LOSS:  Less than 50 mL.  DRAIN:  A 19-French Blake drain.  INDICATION FOR PROCEDURE:  Tony Davila is a 31 year old white male, patient of Dr. 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  He successfully lost his weight from initial weight 376 to a current weight about 160 pounds; however, he has continued to smoke between half a pack and a pack of cigarettes.  He initially had a marginal ulcer already documented.  He knows that his smoking is bad for his health and particularly bad for RYGB patients.  He knows his smoking also increases risk of ulceration.  Anyway, he presented with acute epigastric abdominal pain to [facility].  On CT scan, he was found to have a pneumoperitoneum and transferred to [facility] for me to evaluate.  It is my impression the patient probably perforated a prior known gastrojejunal ulcer.  The indications, potential risks of surgery were explained to the patient.  Potential risks include, but are not limited to, bleeding, infection, which I think he already has, open surgery, and the possibility of ulcer recurring.  I discussed with him and his family over and over again of the need to quit smoking.  OPERATIVE NOTE:  The  patient was placed in a supine position in room #1 at [facility].  His anesthesia was supervised by Dr. Phillips Grout.  He was given Zosyn at the beginning, about 2 or 3 hours preoperatively.  He had a Foley catheter in place.  The abdomen was prepped with ChloraPrep.  A time-out was held with surgical checklist run.  Because his abdomen was so light that we did an open cut down on infraumbilical incision and got into his abdominal cavity with an Hasson trocar.  Right and left lobes of liver unremarkable.  Gallbladder was unremarkable; however, there was a large contamination under the left lobe of the liver where his gastrojejunal anastomosis was.  He also had a lot of fluid in his pelvis, this would be a class 4 wound.  I then put the liver retractor in through a 5 mm trocar in the subxiphoid location.  I put a 5 mm in the left upper quadrant, 11 mm in the right upper quadrant, and was able to identify right at the gastrojejunal anastomosis approximately 8 or 9 mm perforation.  The bowel around it looked viable.  So what I did was I placed two sutures of 2-0 silk suture to approximate the anastomosis and then did  an omental patch on top of that.  I irrigated the abdomen with about 5 L of saline.  I left a 19-French Blake drain in the left upper quadrant up under the diaphragm on the left side.  He will be transferred to the recovery room in good condition.  Sponge and needle count were correct at the end of the case. I then removed the trocars in turn.  The Blake drain was sewn in place with 2-0 nylon suture.  The umbilical port was closed with a 0 Vicryl suture.  The skin at each port was closed with 5-0 Vicryl suture, painted with tinc of benzoin and Steri-Strips.  He will be back in place to step down.  His sponge and needle counts were correct at the end of the case.   Sandria Bales. Ezzard Standing, M.D., FACS   DHN/MEDQ  D:  01/17/2012  T:  01/18/2012  Job:  [id number]  cc:    Lorne Skeens. Hoxworth, M.D. [address] [place] [address]  Charlynn Court, MD

## 2012-01-18 NOTE — Progress Notes (Signed)
Chaplain Note:  Saw patient at 49 Noon.  Chaplain introduced himself and the waiteduntil the nurse finished her tasks and then departed.  He was upset due tot he fact that his mother has declined to come and see him.  He acknowledged that this is an ongoing situation, but it is apparent that he "keeps hoping" and keeps getting disappointed.  He desperately desires visitors and if asleep, he has stated he wants to be wakened to visit.  The 20-25 minute conversation continued to come back around to this issue of his mother visiting.  He is married with no children, and spoke often that this place Doctor, hospital) is scary and he doesn't want to be alone.  The fear may come from why he is here as opposed to the hospital itself.  We processed some of why his mother may be who she is, and discussed not owning her behavior nor to continue to "hope for change" in her.  It was the very smallest of seeds for what what evidently appeared to be a life of disappointment (as he related some incidents).  We prayed near the end of the visit and he was grateful for everything.  Patient takes great comfort in chaplain/pastoral visits.  Left a message at patient request for a pastoral visit, a church announcement (that he is at [employer] and that visitors are welcome).  He did get a call from his supervisor at work who, patient states, will come today to visit.  He also said his wife will visit.  They have been married 8 years, care for his grandmother at home and have no children.  Recommend a chaplain visit, and a recommendation to be sure to structure for the patient how long the chaplain will stay on the visit.  While he said he will be here maybe 7 days, a Monday or Tuesday visit is suggested.  Rema Jasmine, Chaplain Group Pager: [phone] Local  Pager: [phone]

## 2012-01-19 LAB — DIFFERENTIAL
Basophils Relative: 0 % (ref 0–1)
Eosinophils Absolute: 0.1 10*3/uL (ref 0.0–0.7)
Eosinophils Relative: 1 % (ref 0–5)
Monocytes Absolute: 1 10*3/uL (ref 0.1–1.0)
Monocytes Relative: 8 % (ref 3–12)

## 2012-01-19 LAB — CBC
HCT: 38 % — ABNORMAL LOW (ref 39.0–52.0)
Hemoglobin: 13 g/dL (ref 13.0–17.0)
MCH: 31 pg (ref 26.0–34.0)
MCHC: 34.2 g/dL (ref 30.0–36.0)
MCV: 90.7 fL (ref 78.0–100.0)

## 2012-01-19 LAB — BASIC METABOLIC PANEL
BUN: 5 mg/dL — ABNORMAL LOW (ref 6–23)
Chloride: 104 mEq/L (ref 96–112)
Creatinine, Ser: 0.58 mg/dL (ref 0.50–1.35)
GFR calc Af Amer: >90 mL/min (ref >90)
GFR calc non Af Amer: >90 mL/min (ref >90)

## 2012-01-19 MED ORDER — DIPHENHYDRAMINE HCL 50 MG/ML IJ SOLN
12.5000 mg | Freq: Four times a day (QID) | INTRAMUSCULAR | Status: DC | PRN
  Administered 2012-01-19 – 2012-01-24 (×4): 12.5 mg via INTRAVENOUS
  Filled 2012-01-19 (×3): qty 1

## 2012-01-19 MED ORDER — DIPHENHYDRAMINE HCL 50 MG/ML IJ SOLN
INTRAMUSCULAR | Status: AC
  Administered 2012-01-19: 12.5 mg via INTRAVENOUS
  Filled 2012-01-19: qty 1

## 2012-01-19 NOTE — Progress Notes (Signed)
General Surgery Note  LOS: 2 days  POD# 2 Zosyn #2  Assessment/Plan:  1.  Laparoscopic oversew of perforated gastrojejunal ulcer -  To keep NPO until 2/27, then obtain Gastrograffin UGI. Looks good.  Main complaint is suprapubic pain/pain on voiding - probably secondary to foley. To transfer to 5W.  2. RYGB by Dr. Leonard Schwartz. Hoxworth - 04/30/2008 - he said he weighed 376 at the time of surgery, he is down to about 160 pounds now.  3. Smokes. On nicoderm  I have gone over with him in detail the dangers of smoking after RYGB. 4. Depression.  5. HH.  6. Recent brochitis.  7. History of migraine HA.  8. Remote history of kidney stones. 9.  VTE - SQ heparin.  Subjective:  Feels good except suprapubic discomfort.  To ambulate more. Objective:   Filed Vitals:   01/19/12 0400  BP:   Pulse:   Temp: 98.5 F (36.9 C)  Resp:      Intake/Output from previous day:  02/23 0701 - 02/24 0700 In: 3945 [I.V.:3450; IV Piggyback:160] Out: 3500 [Urine:3450; Drains:50]  Intake/Output this shift:      Physical Exam:   General: Thinner WN WM who is alert and oriented.    HEENT: Normal. Pupils equal. Good dentition. .   Lungs: Clear. IS to 2000cc.  Much better than yesterday.   Abdomen: Soft. Quiet.   Wound: Dressing intact. Only 50 cc recorded from JP drain.   Neurologic:  Grossly intact to motor and sensory function.   Psychiatric: Has normal mood and affect. Behavior is normal.  Patient up all night on computer.  I suggested he try to sleep at night.   Lab Results:     Basename 01/19/12 0330 01/18/12 0519  WBC 13.5* 10.9*  HGB 13.0 13.0  HCT 38.0* 37.8*  PLT 169 183    BMET    Basename 01/19/12 0330 01/18/12 0519  NA 135 138  K 3.8 4.1  CL 104 106  CO2 26 27  GLUCOSE 116* 97  BUN 5* 8  CREATININE 0.58 0.67  CALCIUM 8.6 8.3*    PT/INR  No results found for this basename: LABPROT:2,INR:2 in the last 72 hours  ABG  No results found for this basename: PHART:2,PCO2:2,PO2:2,HCO3:2  in the last 72 hours   Studies/Results:  Dg Chest 2 View  01/17/2012  *RADIOLOGY REPORT*  Clinical Data: Chest abdominal pain.  CHEST - 2 VIEW  Comparison: Chest x-ray 12/23/2011.  Findings: The cardiac silhouette, mediastinal and hilar contours are within normal limits and stable.  The lungs are clear.  No pleural effusion.  Findings suspicious for free air under the hemidiaphragm.  Recommend a left side down right side up decubitus abdominal film.  IMPRESSION:  1.  No acute cardiopulmonary findings. 2.  Possible free intraperitoneal air.  Recommend left side down, right side up decubitus abdominal film.  Findings called to Dr. Patria Mane  Original Report Authenticated By: P. Loralie Champagne, M.D.   Ct Abdomen Pelvis W Contrast  01/17/2012  *RADIOLOGY REPORT*  Clinical Data: 31 year old male with abdominal pelvic pain. History of gastric bypass.  CT ABDOMEN AND PELVIS WITH CONTRAST  Technique:  Multidetector CT imaging of the abdomen and pelvis was performed following the standard protocol during bolus administration of intravenous contrast.  Contrast:  100 ml intravenous Omnipaque-300  Comparison: 05/13/2011  Findings: A moderate amount of pneumoperitoneum is identified, primarily within the upper abdomen and compatible with bowel perforation.  Although the source of this free air  is not definitely identified, the bowel perforation is likely at or near the gastric bypass. A moderate amount of free fluid is identified, primarily within the pelvis.  The liver, gallbladder, spleen, kidneys, gallbladder, pancreas and adrenal glands are unremarkable.  There is no evidence of biliary dilatation, enlarged lymph nodes or abdominal aortic aneurysm. The bladder is within normal limits.  No acute or suspicious bony abnormalities are identified.  IMPRESSION: Moderate pneumoperitoneum within the upper abdomen compatible with bowel perforation, which likely is at or near the gastric bypass.  Moderate free fluid in the pelvis.   These results were called to Dr. Patria Mane on 01/17/2012 at 5:10 p.m.  Original Report Authenticated By: Rosendo Gros, M.D.     Anti-infectives:   Anti-infectives     Start     Dose/Rate Route Frequency Ordered Stop   01/18/12 0000   piperacillin-tazobactam (ZOSYN) IVPB 3.375 g        3.375 g 12.5 mL/hr over 240 Minutes Intravenous Every 8 hours 01/17/12 2239     01/17/12 1715   piperacillin-tazobactam (ZOSYN) IVPB 3.375 g        3.375 g 12.5 mL/hr over 240 Minutes Intravenous  Once 01/17/12 1701 01/17/12 2108          Ovidio Kin, MD, FACS Pager: [phone],   [facility] Office: [phone] 01/19/2012

## 2012-01-19 NOTE — Progress Notes (Signed)
Pt c/o of pain, according to pt the pain is in his groin area radiating to his flank area. Pt states " it hurts when I pee". Pt's V/S stable. Pt states pain to be 8 or 9 on a 0-10 scale. Pt on PCA morphine, Will pass this info on to the day nurse for the rounding MD. pt on his laptop thru most of the night. Pt currently not showing any sign of acute distress. Will continue to monitor.

## 2012-01-20 LAB — URINALYSIS, ROUTINE W REFLEX MICROSCOPIC
Glucose, UA: NEGATIVE mg/dL
Leukocytes, UA: NEGATIVE
Protein, ur: NEGATIVE mg/dL

## 2012-01-20 LAB — BASIC METABOLIC PANEL
CO2: 26 mEq/L (ref 19–32)
Calcium: 8.8 mg/dL (ref 8.4–10.5)
Creatinine, Ser: 0.57 mg/dL (ref 0.50–1.35)

## 2012-01-20 NOTE — Progress Notes (Signed)
Looks great. No n/v. Come of some pain with urination.   Check ua Decrease ivf Npo Swallow on wed Cont IV abx secondary to [employer]. Andrey Campanile, MD, FACS General, Bariatric, & Minimally Invasive Surgery [facility], [place]

## 2012-01-20 NOTE — Progress Notes (Signed)
Patient ID: Tony Davila, male   DOB: 1981-09-09, 31 y.o.   MRN: [id number] 3 Days Post-Op  Subjective: Pt feels ok today.  Minimal pain.  No flatus yet.  Objective: Vital signs in last 24 hours: Temp:  [97.7 F (36.5 C)-98.2 F (36.8 C)] 97.8 F (36.6 C) (02/25 0539) Pulse Rate:  [65-92] 81  (02/25 0539) Resp:  [16-20] 16  (02/25 0539) BP: (103-115)/(63-74) 103/63 mmHg (02/25 0539) SpO2:  [96 %-100 %] 98 % (02/25 0721)    Intake/Output from previous day: 02/24 0701 - 02/25 0700 In: 3607.5 [I.V.:3495; IV Piggyback:112.5] Out: 805 [Urine:775; Drains:30] Intake/Output this shift: Total I/O In: -  Out: 400 [Urine:400]  PE: Abd: soft, appropriately tender, -BS, ND, incision c/d/i  Lab Results:   Basename 01/19/12 0330 01/18/12 0519  WBC 13.5* 10.9*  HGB 13.0 13.0  HCT 38.0* 37.8*  PLT 169 183   BMET  Basename 01/20/12 0415 01/19/12 0330  NA 133* 135  K 3.7 3.8  CL 101 104  CO2 26 26  GLUCOSE 112* 116*  BUN 4* 5*  CREATININE 0.57 0.58  CALCIUM 8.8 8.6   PT/INR No results found for this basename: LABPROT:2,INR:2 in the last 72 hours   Studies/Results: No results found.  Anti-infectives: Anti-infectives     Start     Dose/Rate Route Frequency Ordered Stop   01/18/12 0000  piperacillin-tazobactam (ZOSYN) IVPB 3.375 g       3.375 g 12.5 mL/hr over 240 Minutes Intravenous Every 8 hours 01/17/12 2239     01/17/12 1715  piperacillin-tazobactam (ZOSYN) IVPB 3.375 g       3.375 g 12.5 mL/hr over 240 Minutes Intravenous  Once 01/17/12 1701 01/17/12 2108           Assessment/Plan  1. S/p repair of GJ ulcer perf 2. S/p roux-en-y 3. Tobacco dependence  Plan: 1. Cont NPO, will check a gastrografin UGI on wednesday    LOS: 3 days    Connelly Spruell E 01/20/2012

## 2012-01-21 NOTE — Progress Notes (Signed)
No flatus. Min pain. Iv infiltrated.   Soft, nd.  Drain serosang  Gastrograffin swallow in am to evaluate for leak Replace IV  Mary Sella. Andrey Campanile, MD, FACS General, Bariatric, & Minimally Invasive Surgery [facility], [place]

## 2012-01-21 NOTE — Progress Notes (Signed)
Patient ID: Tony Davila, male   DOB: [birth year], 31 y.o.   MRN: [id number] 4 Days Post-Op  Subjective: Pt c/o some pain.  C/o right ear numbness.  No flatus, but no nausea without NGT  Objective: Vital signs in last 24 hours: Temp:  [97.9 F (36.6 C)-98.2 F (36.8 C)] 98.2 F (36.8 C) (02/26 0600) Pulse Rate:  [82-88] 88  (02/26 0600) Resp:  [14-18] 18  (02/26 1021) BP: (99-114)/(66-70) 99/66 mmHg (02/26 0600) SpO2:  [97 %-99 %] 98 % (02/26 1021)    Intake/Output from previous day: 02/25 0701 - 02/26 0700 In: 1877.5 [I.V.:1827.5; IV Piggyback:50] Out: 1215 [Urine:1200; Drains:15] Intake/Output this shift: Total I/O In: -  Out: 400 [Urine:350; Drains:50]  PE: Abd: soft, -BS, ND, mildly tender, JP with serous output.  Lab Results:   Basename 01/19/12 0330  WBC 13.5*  HGB 13.0  HCT 38.0*  PLT 169   BMET  Basename 01/20/12 0415 01/19/12 0330  NA 133* 135  K 3.7 3.8  CL 101 104  CO2 26 26  GLUCOSE 112* 116*  BUN 4* 5*  CREATININE 0.57 0.58  CALCIUM 8.8 8.6   PT/INR No results found for this basename: LABPROT:2,INR:2 in the last 72 hours   Studies/Results: No results found.  Anti-infectives: Anti-infectives     Start     Dose/Rate Route Frequency Ordered Stop   01/18/12 0000  piperacillin-tazobactam (ZOSYN) IVPB 3.375 g       3.375 g 12.5 mL/hr over 240 Minutes Intravenous Every 8 hours 01/17/12 2239     01/17/12 1715  piperacillin-tazobactam (ZOSYN) IVPB 3.375 g       3.375 g 12.5 mL/hr over 240 Minutes Intravenous  Once 01/17/12 1701 01/17/12 2108           Assessment/Plan  1. S/p laparoscopic repair of GJ perforation 2. Right ear numbness  Plan: 1. Will get gastrografin UGI/swallow tomorrow to evaluate for a leak.  If this is negative we will let him try clear liquids 2. Suspect ear numbness may have been neck positioning during surgery.   LOS: 4 days    Darik Massing E 01/21/2012

## 2012-01-22 ENCOUNTER — Inpatient Hospital Stay (HOSPITAL_COMMUNITY): Payer: BC Managed Care – PPO

## 2012-01-22 NOTE — Progress Notes (Signed)
5 Days Post-Op  Subjective: Slept really well. No abd pain. No n/v/flatus/bm. Eagerly awaiting the swallow procedure  Objective: Vital signs in last 24 hours: Temp:  [98.6 F (37 C)-98.9 F (37.2 C)] 98.7 F (37.1 C) (02/27 0605) Pulse Rate:  [77-79] 77  (02/27 0605) Resp:  [16-20] 16  (02/27 0605) BP: (109-128)/(67-79) 128/79 mmHg (02/27 0605) SpO2:  [97 %-100 %] 98 % (02/27 0605)    Intake/Output from previous day: 02/26 0701 - 02/27 0700 In: 1194 [I.V.:1130; IV Piggyback:49] Out: 1272 [Urine:1150; Drains:122] Intake/Output this shift:    Alert, nad, resting comfortably cta Reg Soft, nontender, incisions c/d/i, hypobs. Drain-serous  Lab Results:  No results found for this basename: WBC:2,HGB:2,HCT:2,PLT:2 in the last 72 hours BMET  Tony Davila 01/20/12 0415  NA 133*  K 3.7  CL 101  CO2 26  GLUCOSE 112*  BUN 4*  CREATININE 0.57  CALCIUM 8.8   PT/INR No results found for this basename: LABPROT:2,INR:2 in the last 72 hours ABG No results found for this basename: PHART:2,PCO2:2,PO2:2,HCO3:2 in the last 72 hours  Studies/Results: No results found.  Anti-infectives: Anti-infectives     Start     Dose/Rate Route Frequency Ordered Stop   01/18/12 0000   piperacillin-tazobactam (ZOSYN) IVPB 3.375 g        3.375 g 12.5 mL/hr over 240 Minutes Intravenous Every 8 hours 01/17/12 2239     01/17/12 1715   piperacillin-tazobactam (ZOSYN) IVPB 3.375 g        3.375 g 12.5 mL/hr over 240 Minutes Intravenous  Once 01/17/12 1701 01/17/12 2108          Assessment/Plan: s/p Procedure(s) (LRB): LAPAROSCOPY DIAGNOSTIC (N/A), lap graham patch repair of g-j perforation  gastrograffin swallow today - if normal will start clears.  Cont iv abx for 2 more days due to perforation  Tony Davila. Tony Campanile, MD, FACS General, Bariatric, & Minimally Invasive Surgery [facility], [place]   LOS: 5 days    Atilano Ina 01/22/2012

## 2012-01-22 NOTE — Progress Notes (Signed)
Pt has been educated regarding correct use of Incentive Spirometer (IS). Pt verbalizes understanding of correct use. Pt states that there is no further questions regarding correct use of the IS. Aubert Choyce Lorraine Bentlee Benningfield, RN    

## 2012-01-23 MED ORDER — OXYCODONE-ACETAMINOPHEN 5-325 MG PO TABS
1.0000 | ORAL_TABLET | ORAL | Status: DC | PRN
  Administered 2012-01-23 – 2012-01-24 (×5): 2 via ORAL
  Filled 2012-01-23 (×5): qty 2

## 2012-01-23 MED ORDER — SUCRALFATE 1 G PO TABS
1.0000 g | ORAL_TABLET | Freq: Three times a day (TID) | ORAL | Status: DC
  Administered 2012-01-23 – 2012-01-24 (×3): 1 g via ORAL
  Filled 2012-01-23 (×6): qty 1

## 2012-01-23 NOTE — Progress Notes (Signed)
Tolerating liquid diet. No abd pain. No n/v/regurg.  abd soft, nt, nd, redundant skin  D/c jp drain now Add Carafate  Anticipate home Friday on protonix and Carafate, no abx. Mary Sella. Andrey Campanile, MD, FACS General, Bariatric, & Minimally Invasive Surgery [facility], [place]

## 2012-01-23 NOTE — Progress Notes (Signed)
Pt's LLQ JP drain has been leaking and RN changes 4 x4 gauze and dressing frequently. Drainage is serous in color and a copious amount. MD notified. RN advised to closely monitor drainage/leaking around JP drain and to change dressing PRN and keep skin around site clean and dry. Marcelino Duster, RN

## 2012-01-23 NOTE — Progress Notes (Signed)
Patient ID: Tony Davila, male   DOB: Mar 11, 1981, 31 y.o.   MRN: [id number] 6 Days Post-Op  Subjective: Pt feeling better today.  Tolerating clear liquids.  Had some drainage around his JP last night, serous in nature.  Still says no flatus.  Objective: Vital signs in last 24 hours: Temp:  [98.3 F (36.8 C)-98.4 F (36.9 C)] 98.4 F (36.9 C) (02/28 0604) Pulse Rate:  [74-85] 74  (02/28 0604) Resp:  [16-20] 16  (02/28 0752) BP: (104-128)/(65-84) 104/65 mmHg (02/28 0604) SpO2:  [96 %-98 %] 97 % (02/28 0604) Last BM Date: 01/16/12  Intake/Output from previous day: 02/27 0701 - 02/28 0700 In: 1703.5 [I.V.:1653.5; IV Piggyback:50] Out: 1850 [Urine:1625; Drains:225] Intake/Output this shift: Total I/O In: -  Out: 220 [Urine:200; Drains:20]  PE: Abd: soft, mildly tender, +BS, ND, incision c/d/i, some serous drainage around JP with only serous drainage in JP drain.  Lab Results:  No results found for this basename: WBC:2,HGB:2,HCT:2,PLT:2 in the last 72 hours BMET No results found for this basename: NA:2,K:2,CL:2,CO2:2,GLUCOSE:2,BUN:2,CREATININE:2,CALCIUM:2 in the last 72 hours PT/INR No results found for this basename: LABPROT:2,INR:2 in the last 72 hours   Studies/Results: Dg Ugi W/water Sol Cm  01/22/2012  *RADIOLOGY REPORT*  Clinical Data:  History of perforation at gastrojejunostomy status post recent repair.  Evaluate for leak.  History of gastric bypass.  UPPER GI SERIES WITH KUB  Technique:  Routine upper GI series was performed with Omnipaque- 300.  Fluoroscopy Time: 1.2 minutes  Comparison:  Abdominal CT 01/17/2012.  Findings: The scout abdominal radiograph demonstrates a surgical drain within the mid abdomen.  Anastomosis clips are present in the left upper quadrant and left mid abdomen. There is some residual contrast material within the right colon.  Initial fluoroscopy with the patient erect shows no residual pneumoperitoneum.  The patient swallowed the contrast without  difficulty.  There is rapid filling of the gastric pouch and rapid emptying through the gastrojejunostomy.  There is no evidence of leak.  Jejunal loops are opacified extending into the pelvis.  No bowel distension or opacification of the surgical drain is identified.  IMPRESSION:  1.  No evidence of residual perforation status post repair of gastrojejunostomy. 2.  No evidence of obstruction.  Original Report Authenticated By: Gerrianne Scale, M.D.    Anti-infectives: Anti-infectives     Start     Dose/Rate Route Frequency Ordered Stop   01/18/12 0000  piperacillin-tazobactam (ZOSYN) IVPB 3.375 g       3.375 g 12.5 mL/hr over 240 Minutes Intravenous Every 8 hours 01/17/12 2239     01/17/12 1715  piperacillin-tazobactam (ZOSYN) IVPB 3.375 g       3.375 g 12.5 mL/hr over 240 Minutes Intravenous  Once 01/17/12 1701 01/17/12 2108           Assessment/Plan  1. S/p laparoscopic GJ anastomotic perf repair 2. Post-op ileus, resolving  Plan: 1. Advance to full liquid diet today. 2. Anticipate d/c JP drain tomorrow 3. Patient will need to pass flatus or have a BM prior to discharge.   LOS: 6 days    Tony Davila E 01/23/2012

## 2012-01-24 MED ORDER — SUCRALFATE 1 G PO TABS: 1.0000 g | ORAL_TABLET | Freq: Three times a day (TID) | ORAL | Status: DC

## 2012-01-24 MED ORDER — OXYCODONE-ACETAMINOPHEN 5-325 MG PO TABS: 1.0000 | ORAL_TABLET | ORAL | Status: AC | PRN

## 2012-01-24 NOTE — Discharge Summary (Signed)
Patient ID: Tony Davila MRN: [id number] DOB/AGE: 1981/05/05 31 y.o.  Admit date: 01/17/2012 Discharge date: 01/24/2012  Procedures:  Laparoscopic repair of gastrojejunal ulcer perforation  Consults: None  Reason for Admission:  This is a 31 year old male who underwent gastric bypass surgery several years ago. He has done well from the surgery and has lost over 150 pounds. However he has continued to smoke cigarettes. He presented to [facility] emergency department with complaints of severe upper abdominal pain. He had a CT scan which revealed pneumoperitoneum. This was felt to be likely secondary to a GJ ulcer perforation. We were asked to evaluate the patient for admission. Please see admitting history and physical for further details.  Admission Diagnoses: 1. Pneumoperitoneum 2. History of gastric bypass surgery 3. History of morbid obesity 4. Tobacco abuse 5. Hypertension  Hospital Course: The patient was admitted and taken to the operating room. He was found to have a perforation at his gastrojejunal anastomosis from his gastric bypass surgery. This was laparoscopically repaired by Dr. Ovidio Kin. Postoperatively the patient did not have an NG tube in place; however, the patient was left n.p.o. for 5 days to allow this repair to seal. On postoperative day 5, the patient had an upper GI with a Gastrografin swallow. This revealed no leak. Therefore he was placed on a clear liquid diet. He was changed from IV protonic to by mouth Protonix. Carafate was also added. His diet was then advanced to full liquids. On postoperative day #7 the patient was felt stable for discharge home. He was left on full liquids and informed to advance his diet back up to a postgastrectomy diet within 5 days at home. The patient's abdomen remained stable in all of his incisions were clean dry and intact. He did have a JP drain placed postoperatively. This was removed on postoperative day 6 with no  complications.  Discharge Diagnoses:  1. Pneumoperitoneum secondary to anastomotic ulcer perforation at his GJ site from prior gastric bypass surgery 2. Status post laparoscopic repair of #1  Discharge Medications: Medication List  As of 01/24/2012 11:30 AM   TAKE these medications         calcium gluconate 500 MG tablet   Take 500 mg by mouth daily.      Casanthranol-Docusate Sodium 30-100 MG Caps   Take by mouth daily.      desvenlafaxine 100 MG 24 hr tablet   Commonly known as: PRISTIQ   Take 1 tablet (100 mg total) by mouth daily.      IRON PO   Take 65 mg by mouth daily. 2 tab daily      multivitamin tablet   Take 1 tablet by mouth daily.      omeprazole 40 MG capsule   Commonly known as: PRILOSEC   Take 1 capsule (40 mg total) by mouth daily. Take 30 minutes before breakfast      oxyCODONE-acetaminophen 5-325 MG per tablet   Commonly known as: PERCOCET   Take 1-2 tablets by mouth every 4 (four) hours as needed.      sucralfate 1 G tablet   Commonly known as: CARAFATE   Take 1 tablet (1 g total) by mouth 4 (four) times daily -  with meals and at bedtime.      topiramate 50 MG tablet   Commonly known as: TOPAMAX   Take 1 tablet (50 mg total) by mouth 2 (two) times daily.      VENTOLIN HFA 108 (90 BASE) MCG/ACT inhaler  Generic drug: albuterol   Inhale 2 puffs into the lungs every 6 (six) hours as needed. As needed for shortness of breath.      VITAMIN B 12 PO   Take 1,000 mcg by mouth daily.            Discharge Instructions: Follow-up Information    Follow up with Letitia Libra, Ala Dach, MD. (As needed)       Follow up with [facility], MD. Schedule an appointment as soon as possible for a visit in 2 days.   Contact information:   [employer] [facility] [address], [address] [place] [address] [phone]          Signed: Letha Cape 01/24/2012, 11:30 AM

## 2012-01-24 NOTE — Progress Notes (Signed)
RN changed dressing at site where JP drain was d/c'ed. Scant amount of drainage, serous color noted on old dressing. New dressing is clean, dry, and intact. Pt tolerated dressing change well. Marcelino Duster, RN

## 2012-01-24 NOTE — Progress Notes (Signed)
Pt has been d/c to the home. Pt and pt's family verbalize understanding of all d/c instructions and state there are no further questions. Pt taken to car via wheelchair.Maral Lampe Lorraine Dezarae Mcclaran, RN    

## 2012-01-24 NOTE — Discharge Summary (Signed)
Tony Davila M. Tony Tangeman, MD, FACS General, Bariatric, & Minimally Invasive Surgery [facility]  

## 2012-01-24 NOTE — Progress Notes (Signed)
7 Days Post-Op  Subjective: Doing well. Tolerated fulls. +flatus. No n/v/abd pain. Reports jp drain site closed up.  Objective: Vital signs in last 24 hours: Temp:  [97.6 F (36.4 C)-97.9 F (36.6 C)] 97.9 F (36.6 C) (03/01 0600) Pulse Rate:  [60-73] 60  (03/01 0600) Resp:  [16-18] 18  (03/01 0600) BP: (107-120)/(65-73) 107/65 mmHg (03/01 0600) SpO2:  [97 %-98 %] 98 % (03/01 0600) Last BM Date: 01/16/12  Intake/Output from previous day: 02/28 0701 - 03/01 0700 In: 329 [I.V.:329] Out: 1220 [Urine:1200; Drains:20] Intake/Output this shift: Total I/O In: -  Out: 375 [Urine:375]  Alert, nad cta Soft, nt, nd. +BS. Incision c/d/i  Lab Results:  No results found for this basename: WBC:2,HGB:2,HCT:2,PLT:2 in the last 72 hours BMET No results found for this basename: NA:2,K:2,CL:2,CO2:2,GLUCOSE:2,BUN:2,CREATININE:2,CALCIUM:2 in the last 72 hours PT/INR No results found for this basename: LABPROT:2,INR:2 in the last 72 hours ABG No results found for this basename: PHART:2,PCO2:2,PO2:2,HCO3:2 in the last 72 hours  Studies/Results: Dg Ugi W/water Sol Cm  01/22/2012  *RADIOLOGY REPORT*  Clinical Data:  History of perforation at gastrojejunostomy status post recent repair.  Evaluate for leak.  History of gastric bypass.  UPPER GI SERIES WITH KUB  Technique:  Routine upper GI series was performed with Omnipaque- 300.  Fluoroscopy Time: 1.2 minutes  Comparison:  Abdominal CT 01/17/2012.  Findings: The scout abdominal radiograph demonstrates a surgical drain within the mid abdomen.  Anastomosis clips are present in the left upper quadrant and left mid abdomen. There is some residual contrast material within the right colon.  Initial fluoroscopy with the patient erect shows no residual pneumoperitoneum.  The patient swallowed the contrast without difficulty.  There is rapid filling of the gastric pouch and rapid emptying through the gastrojejunostomy.  There is no evidence of leak.  Jejunal  loops are opacified extending into the pelvis.  No bowel distension or opacification of the surgical drain is identified.  IMPRESSION:  1.  No evidence of residual perforation status post repair of gastrojejunostomy. 2.  No evidence of obstruction.  Original Report Authenticated By: Gerrianne Scale, M.D.    Anti-infectives: Anti-infectives     Start     Dose/Rate Route Frequency Ordered Stop   01/18/12 0000   piperacillin-tazobactam (ZOSYN) IVPB 3.375 g        3.375 g 12.5 mL/hr over 240 Minutes Intravenous Every 8 hours 01/17/12 2239     01/17/12 1715   piperacillin-tazobactam (ZOSYN) IVPB 3.375 g        3.375 g 12.5 mL/hr over 240 Minutes Intravenous  Once 01/17/12 1701 01/17/12 2108          Assessment/Plan: s/p Procedure(s) (LRB): LAPAROSCOPY DIAGNOSTIC (N/A) LAPAROSCOPIC GRAHAM PATCH REPAIR OF G-J perforation secondary to ulcer  Ok to d/c home No need for abx on d/c Discussed discharge instruction (NO SMOKING!!!, need to take anti-ulcer meds, no strenuous activity for 1 more week F/u with Dr Johna Sheriff Pt will need upper EGD in 3 months or so.  Mary Sella. Andrey Campanile, MD, FACS General, Bariatric, & Minimally Invasive Surgery [facility] [place]   LOS: 7 days    Tony Davila 01/24/2012

## 2012-01-27 ENCOUNTER — Encounter (HOSPITAL_COMMUNITY): Payer: Self-pay | Admitting: Surgery

## 2012-01-27 ENCOUNTER — Telehealth (INDEPENDENT_AMBULATORY_CARE_PROVIDER_SITE_OTHER): Payer: Self-pay | Admitting: General Surgery

## 2012-01-27 NOTE — Telephone Encounter (Signed)
Follow up appointment given w/Dr. Ezzard Standing 01/29/12 @ 11:45 am, patient to arrive

## 2012-01-29 ENCOUNTER — Encounter (INDEPENDENT_AMBULATORY_CARE_PROVIDER_SITE_OTHER): Payer: Self-pay | Admitting: Surgery

## 2012-01-29 ENCOUNTER — Ambulatory Visit (INDEPENDENT_AMBULATORY_CARE_PROVIDER_SITE_OTHER): Payer: BC Managed Care – PPO | Admitting: Surgery

## 2012-01-29 DIAGNOSIS — Z9884 Bariatric surgery status: Secondary | ICD-10-CM | POA: Insufficient documentation

## 2012-01-29 DIAGNOSIS — K289 Gastrojejunal ulcer, unspecified as acute or chronic, without hemorrhage or perforation: Secondary | ICD-10-CM

## 2012-01-29 NOTE — Progress Notes (Signed)
[facility]  Ovidio Kin, MD,  FACS [address] [place]  [address] [place], [place]    [address] Phone:  [phone] FAX:  [phone]   Re:   Tony Davila DOB:   10-Aug-1981 MRN:   [id number]  ASSESSMENT AND PLAN: 1. Laparoscopic oversew of perforated gastrojejunal ulcer - repaired 01/17/2012.   To continue omeprazole (40 mg QD) and carafate for at least 3 months.  Consider repeat endoscopy in 3 months.  Dr. Leone Payor did an endoscopy in around June 2012 when he was having some abdominal pain - but did not see anything.  I wrote him a note to return to work 02/03/2012.  He's to return to see Dr. Johna Sheriff in 3 months.  2. RYGB by Dr. Leonard Schwartz. [place] - 04/30/2008 - he said he weighed 376 at the time of surgery, he is down to about 160 pounds now.  3. Smokes. He has quit so far.  Now he is struggling with this wife smoking.   I have gone over with him in detail the dangers of smoking after RYGB.  4. Depression.  5. HH.  6. Recent brochitis.  7. History of migraine HA. These were actually improved with changing eye glass prescription. 8. Remote history of kidney stones.  9.  Coccyx pain.  He was worried about a pilonidal.  He does not have a pilonidal.  His pain may be due fat pad loss over the coccyx. 10.  Right ear numbness since surgery.  HISTORY OF PRESENT ILLNESS: Chief Complaint  Patient presents with  . Routine Post Op    Perforated ulcer    Tony Davila is a 31 y.o. (DOB: Dec 27, 1980)  white male who is a patient of Letitia Libra, Ala Dach, MD, MD and comes to me today for follow up of perforated marginal ulcer secondary to cigarette smoking.  He has done very well since the surgery.  His complaints include:  Constipation, right ear numbness, and coccyx pain (this predated the surgery).  PHYSICAL EXAM: BP 118/76  Pulse 64  Temp(Src) 97.4 F (36.3 C) (Temporal)  Resp 18  Ht 5\' 6"  (1.676 m)  Wt 156 lb 6.4 oz (70.943 kg)  BMI 25.24 kg/m2  General:  Thin WM who is alert and generally healthy appearing.  HEENT: Normal. Pupils equal. Good dentition. Lungs: Clear to auscultation and symmetric breath sounds. Heart:  RRR. No murmur or rub.  Abdomen: Soft. Bruises from SQ heparin.  Incisions look good. Buttocks:  I see no evidence of a pilonidal. Extremities:  Good strength and ROM  in upper and lower extremities. Neurologic:  Grossly intact to motor and sensory function. Psychiatric: Has normal mood and affect. Behavior is normal.   DATA REVIEWED: Hospital records.  Ovidio Kin, MD, FACS Office:  [phone]

## 2012-01-31 ENCOUNTER — Other Ambulatory Visit: Payer: Self-pay | Admitting: *Deleted

## 2012-01-31 MED ORDER — DESVENLAFAXINE SUCCINATE ER 100 MG PO TB24: 100.0000 mg | ORAL_TABLET | Freq: Every day | ORAL | Status: DC

## 2012-01-31 NOTE — Telephone Encounter (Signed)
Patient called requesting a refill on Pristiq. He states that he has lost the Rx that was given to him by Dr Rodena Medin. Rx refill sent to pharmacy.

## 2012-03-20 ENCOUNTER — Other Ambulatory Visit: Payer: Self-pay | Admitting: Internal Medicine

## 2012-06-08 ENCOUNTER — Encounter: Payer: Self-pay | Admitting: Internal Medicine

## 2012-07-03 ENCOUNTER — Ambulatory Visit: Payer: Self-pay | Admitting: Internal Medicine

## 2012-08-07 ENCOUNTER — Encounter: Payer: Self-pay | Admitting: Family

## 2012-08-07 ENCOUNTER — Telehealth: Payer: Self-pay | Admitting: *Deleted

## 2012-08-07 ENCOUNTER — Ambulatory Visit (HOSPITAL_BASED_OUTPATIENT_CLINIC_OR_DEPARTMENT_OTHER)
Admission: RE | Admit: 2012-08-07 | Discharge: 2012-08-07 | Disposition: A | Discharge status: Home / Self Care | Payer: BC Managed Care – PPO | Source: Ambulatory Visit | Attending: Family | Admitting: Family

## 2012-08-07 ENCOUNTER — Ambulatory Visit (INDEPENDENT_AMBULATORY_CARE_PROVIDER_SITE_OTHER): Payer: BC Managed Care – PPO | Admitting: Family

## 2012-08-07 VITALS — BP 120/78 | HR 99 | Temp 100.0°F | Resp 16 | Wt 154.1 lb

## 2012-08-07 DIAGNOSIS — R509 Fever, unspecified: Secondary | ICD-10-CM | POA: Insufficient documentation

## 2012-08-07 DIAGNOSIS — J189 Pneumonia, unspecified organism: Secondary | ICD-10-CM | POA: Insufficient documentation

## 2012-08-07 MED ORDER — LEVOFLOXACIN 500 MG PO TABS: 500.0000 mg | ORAL_TABLET | Freq: Every day | ORAL | Status: AC

## 2012-08-07 MED ORDER — FLUTICASONE-SALMETEROL 250-50 MCG/DOSE IN AEPB: 1.0000 | INHALATION_SPRAY | Freq: Two times a day (BID) | RESPIRATORY_TRACT | Status: DC

## 2012-08-07 NOTE — Assessment & Plan Note (Signed)
Suspect pneumonia.  Obtain CXR.  Continue albuterol, rx with levaquin, add advair (2 one week samples provided today). Follow up in 1 week.

## 2012-08-07 NOTE — Patient Instructions (Addendum)
Please complete your chest x-ray on the first floor.  Follow up with Dr. Rodena Medin in 1 week.  Call if you develop fever >101, shortness of breath, or if no improvement by Monday. Go to ER if your symptoms become severe.

## 2012-08-07 NOTE — Telephone Encounter (Signed)
Called pt- Pt not home. Left message with "joe" family member letting pt know that I called.  Reviewed  CXR:  shows pneumonia.  He was instructed at his visit to start levaquin tonight.  Pls notify pt of results and contact him Monday.  I would like to see him back in the office early next week - Monday or Tuesday.

## 2012-08-07 NOTE — Progress Notes (Signed)
Subjective:    Patient ID: Tony Davila, male    DOB: 07-14-1981, 31 y.o.   MRN: [id number]  HPI  Tony Davila is a 31 yr old male who presents today with chief complaint of cough.  He reports that cough has been present x 2 weeks.  He is a smoker.  He went to urgent care and was rx'd clarithromycin, pred taper and albuterol.  He has completed abx and pred and notes no improvement in his symptoms.   He did not have a chest xray.   + fever- low grade.  Cough is dry.     Review of Systems    see HPI  Past Medical History  Diagnosis Date  . 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 Depression   . Kidney stone   . Anemia   . GERD (gastroesophageal reflux disease)   . Hypertension     not tx for now  . Hiatal hernia   . Jejunal ulcer 2009    anastomotic    History   Social History  . Marital Status: Married    Spouse Name: N/A    Number of Children: 0  . Years of Education: N/A   Occupational History  .     Social History Main Topics  . Smoking status: Current Every Day Smoker -- 12 years    Types: Cigarettes  . Smokeless tobacco: Never Used   Comment: less than 1/2 ppd.  . Alcohol Use: No  . Drug Use: No  . Sexually Active: Not on file   Other Topics Concern  . Not on file   Social History Narrative  . No narrative on file    Past Surgical History  Procedure Date  . Tonsillectomy   . Exploratory laparotomy   . Gastric bypass 2009    Dr. Johna Sheriff  . Upper gastrointestinal endoscopy 02/19/11    normal post-op exam  . Laparoscopy 01/17/2012    Procedure: LAPAROSCOPY DIAGNOSTIC;  Surgeon: Kandis Cocking, MD;  Location: [facility];  Service: General;  Laterality: N/A;  OVERSEW PERFORATED GASTRIC JEJUNAL ULCER    Family History  Problem Relation Age of Onset  . Heart disease Maternal Grandmother   . Hyperlipidemia Maternal Grandmother   . Hypertension Maternal Grandmother   . Mental illness Maternal Grandmother   . Irritable bowel  syndrome Father   . Irritable bowel syndrome Brother   . Colon cancer Neg Hx     No Known Allergies  Current Outpatient Prescriptions on File Prior to Visit  Medication Sig Dispense Refill  . albuterol (VENTOLIN HFA) 108 (90 BASE) MCG/ACT inhaler Inhale 2 puffs into the lungs every 6 (six) hours as needed. As needed for shortness of breath.      Marland Kitchen omeprazole (PRILOSEC) 40 MG capsule TAKE 1 CAPSULE (40 MG TOTAL) BY MOUTH DAILY. TAKE 30 MINUTES BEFORE BREAKFAST  30 capsule  5  . sucralfate (CARAFATE) 1 G tablet Take 1 tablet (1 g total) by mouth 4 (four) times daily -  with meals and at bedtime.  120 tablet  3  . calcium gluconate 500 MG tablet Take 500 mg by mouth daily.        Jennette Banker Sodium 30-100 MG CAPS Take by mouth daily.        . Cyanocobalamin (VITAMIN B 12 PO) Take 1,000 mcg by mouth daily.       Marland Kitchen desvenlafaxine (PRISTIQ) 100 MG 24 hr tablet Take 1 tablet (100  mg total) by mouth daily.  30 tablet  3  . Fluticasone-Salmeterol (ADVAIR DISKUS) 250-50 MCG/DOSE AEPB Inhale 1 puff into the lungs 2 (two) times daily.  1 each  0  . IRON PO Take 65 mg by mouth daily. 2 tab daily      . Multiple Vitamin (MULTIVITAMIN) tablet Take 1 tablet by mouth daily.        Marland Kitchen topiramate (TOPAMAX) 50 MG tablet Take 1 tablet (50 mg total) by mouth 2 (two) times daily.  60 tablet  5    BP 120/78  Pulse 99  Temp 100 F (37.8 C) (Oral)  Resp 16  Wt 154 lb 1.9 oz (69.908 kg)  SpO2 99%    Objective:   Physical Exam  Constitutional: He appears well-developed and well-nourished. No distress.  HENT:  Right Ear: Tympanic membrane and ear canal normal.  Left Ear: Tympanic membrane and ear canal normal.  Mouth/Throat: No oropharyngeal exudate or posterior oropharyngeal edema.  Cardiovascular: Normal rate and regular rhythm.   No murmur heard. Pulmonary/Chest: Effort normal. No respiratory distress. He has wheezes in the right upper field. He has rales in the right upper field. He  exhibits no tenderness.  Psychiatric: He has a normal mood and affect. His behavior is normal. Judgment and thought content normal.          Assessment & Plan:

## 2012-08-07 NOTE — Telephone Encounter (Signed)
Received call report from [facility] with CXR results from today. See results in EPIC.  Please advise.

## 2012-08-10 ENCOUNTER — Encounter: Payer: Self-pay | Admitting: Family

## 2012-08-10 ENCOUNTER — Telehealth: Payer: Self-pay | Admitting: Internal Medicine

## 2012-08-10 ENCOUNTER — Ambulatory Visit (INDEPENDENT_AMBULATORY_CARE_PROVIDER_SITE_OTHER): Payer: BC Managed Care – PPO | Admitting: Family

## 2012-08-10 VITALS — BP 110/70 | HR 82 | Temp 98.1°F | Resp 16 | Wt 155.0 lb

## 2012-08-10 DIAGNOSIS — J189 Pneumonia, unspecified organism: Secondary | ICD-10-CM

## 2012-08-10 MED ORDER — HYDROCOD POLST-CHLORPHEN POLST 10-8 MG/5ML PO LQCR: 5.0000 mL | Freq: Two times a day (BID) | ORAL | Status: DC | PRN

## 2012-08-10 NOTE — Progress Notes (Signed)
Subjective:    Patient ID: Tony Davila, male    DOB: 1981-01-02, 31 y.o.   MRN: [id number]  HPI  Tony Davila is a 31 yr old male who presents today for follow up of his pneumonia.  He was initially seen on 9/13 and diagnosed with pneumonia.  He was started on Levaquin that day.  He reports subjective temperature last night.  Reports cough is unchanged. Trouble sleeping due to cough and has had some post tussive gagging/vomitting especially at night when he lays flat.  He is keeping down liquids and some food. Review of Systems    see HPI  Past Medical History  Diagnosis Date  . 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 Depression   . Kidney stone   . Anemia   . GERD (gastroesophageal reflux disease)   . Hypertension     not tx for now  . Hiatal hernia   . Jejunal ulcer 2009    anastomotic    History   Social History  . Marital Status: Married    Spouse Name: N/A    Number of Children: 0  . Years of Education: N/A   Occupational History  .     Social History Main Topics  . Smoking status: Current Every Day Smoker -- 12 years    Types: Cigarettes  . Smokeless tobacco: Never Used   Comment: less than 1/2 ppd.  . Alcohol Use: No  . Drug Use: No  . Sexually Active: Not on file   Other Topics Concern  . Not on file   Social History Narrative  . No narrative on file    Past Surgical History  Procedure Date  . Tonsillectomy   . Exploratory laparotomy   . Gastric bypass 2009    Dr. Johna Sheriff  . Upper gastrointestinal endoscopy 02/19/11    normal post-op exam  . Laparoscopy 01/17/2012    Procedure: LAPAROSCOPY DIAGNOSTIC;  Surgeon: Kandis Cocking, MD;  Location: [facility];  Service: General;  Laterality: N/A;  OVERSEW PERFORATED GASTRIC JEJUNAL ULCER    Family History  Problem Relation Age of Onset  . Heart disease Maternal Grandmother   . Hyperlipidemia Maternal Grandmother   . Hypertension Maternal Grandmother   . Mental illness  Maternal Grandmother   . Irritable bowel syndrome Father   . Irritable bowel syndrome Brother   . Colon cancer Neg Hx     No Known Allergies  Current Outpatient Prescriptions on File Prior to Visit  Medication Sig Dispense Refill  . albuterol (VENTOLIN HFA) 108 (90 BASE) MCG/ACT inhaler Inhale 2 puffs into the lungs every 6 (six) hours as needed. As needed for shortness of breath.      . Fluticasone-Salmeterol (ADVAIR DISKUS) 250-50 MCG/DOSE AEPB Inhale 1 puff into the lungs 2 (two) times daily.  1 each  0  . levofloxacin (LEVAQUIN) 500 MG tablet Take 1 tablet (500 mg total) by mouth daily.  7 tablet  0  . omeprazole (PRILOSEC) 40 MG capsule TAKE 1 CAPSULE (40 MG TOTAL) BY MOUTH DAILY. TAKE 30 MINUTES BEFORE BREAKFAST  30 capsule  5  . sucralfate (CARAFATE) 1 G tablet Take 1 tablet (1 g total) by mouth 4 (four) times daily -  with meals and at bedtime.  120 tablet  3  . topiramate (TOPAMAX) 50 MG tablet Take 1 tablet (50 mg total) by mouth 2 (two) times daily.  60 tablet  5    BP 110/70  Pulse 82  Temp 98.1 F (36.7 C) (Oral)  Resp 16  Wt 155 lb 0.6 oz (70.326 kg)  SpO2 99%    Objective:   Physical Exam  Constitutional: He appears well-developed and well-nourished. No distress.  HENT:  Head: Normocephalic and atraumatic.  Cardiovascular: Normal rate and regular rhythm.   No murmur heard. Pulmonary/Chest: Effort normal and breath sounds normal. No respiratory distress. He has no wheezes. He has no rales. He exhibits no tenderness.  Abdominal: Soft. Bowel sounds are normal. He exhibits no distension and no mass. There is no tenderness. There is no rebound and no guarding.  Musculoskeletal: He exhibits no edema.  Psychiatric: He has a normal mood and affect. His behavior is normal. Judgment and thought content normal.          Assessment & Plan:

## 2012-08-10 NOTE — Telephone Encounter (Signed)
I would recommend that he be re-evaluated today either.  He can be re-evaluated here, at an urgent care or by Dr. Caryl Never due to worsening cough and inability "to keep anything down."  I would recommend that he stay out of work until feeling better.  We can provide him with a note for work.

## 2012-08-10 NOTE — Patient Instructions (Addendum)
Please follow up on Friday. Call if your symptoms worsen or do not improve.  Continue your antibiotics.

## 2012-08-10 NOTE — Telephone Encounter (Signed)
Notified pt of results. Pt is requesting letter to give to employer re: limitations/restrictions such as avoiding cleaning chemicals, sweeping (dust) etc. Pt states his cough seems worse. Reports that he has not been able to keep anything down over the weekend as he is coughing so much that he vomits. He is requesting medication for his cough. Pt declines to schedule appt with [place] at this time as he is "in the process of changing to Dr Caryl Never as his office is closer to his home". Pt states he is trying to get an appt with him this week for another concern and wants to follow up with Dr Caryl Never re: his pneumonia as well.  Please advise.

## 2012-08-10 NOTE — Telephone Encounter (Signed)
Pt would like to switch from Dr Rodena Medin to Dr Caryl Never due to [facility] is closer to  His home.

## 2012-08-10 NOTE — Telephone Encounter (Signed)
OK with me.

## 2012-08-10 NOTE — Telephone Encounter (Signed)
Notified pt. He states he is not able to return to [place] for follow up as it is too far for him to drive. I advised him that I am not able to schedule follow up at [facility]. He reports that he already spoke with them and they are awaiting final "ok" from each doctor before appointment can be made with Dr Caryl Never. Advised pt he could be seen in urgent care if he is unable to return here for follow up. Pt states he will make arrangements on his own and declines appt here.

## 2012-08-11 NOTE — Telephone Encounter (Signed)
Of course!

## 2012-08-11 NOTE — Assessment & Plan Note (Addendum)
Exam is improved, sats stable.  I advised him to remain out of work this week while he recovers and have provided him with a note for work.  Continue Levaquin, add tussionex prn (advised pt not to drive while taking this medication).  Follow up on 08/14/12.  Add Advair to help with some of his "chest tightness."

## 2012-08-12 NOTE — Telephone Encounter (Signed)
Pt will kept appt at [facility] location this Friday. Pt will call back to est with Dr Caryl Never

## 2012-08-14 ENCOUNTER — Encounter: Payer: Self-pay | Admitting: Family

## 2012-08-14 ENCOUNTER — Ambulatory Visit (INDEPENDENT_AMBULATORY_CARE_PROVIDER_SITE_OTHER): Payer: BC Managed Care – PPO | Admitting: Family

## 2012-08-14 ENCOUNTER — Ambulatory Visit (HOSPITAL_BASED_OUTPATIENT_CLINIC_OR_DEPARTMENT_OTHER)
Admission: RE | Admit: 2012-08-14 | Discharge: 2012-08-14 | Disposition: A | Discharge status: Home / Self Care | Payer: BC Managed Care – PPO | Source: Ambulatory Visit | Attending: Family | Admitting: Family

## 2012-08-14 VITALS — BP 110/58 | HR 81 | Temp 98.5°F | Resp 16 | Wt 162.2 lb

## 2012-08-14 DIAGNOSIS — J90 Pleural effusion, not elsewhere classified: Secondary | ICD-10-CM | POA: Insufficient documentation

## 2012-08-14 DIAGNOSIS — J189 Pneumonia, unspecified organism: Secondary | ICD-10-CM

## 2012-08-14 DIAGNOSIS — Z23 Encounter for immunization: Secondary | ICD-10-CM

## 2012-08-14 NOTE — Assessment & Plan Note (Signed)
Clinically improved.  Repeat chest x-ray.  Discussed importance of smoking cessation.

## 2012-08-14 NOTE — Patient Instructions (Addendum)
Please complete chest x ray on the first floor.  Call if symptoms worsen or if symptoms do not continue to improve.   

## 2012-08-14 NOTE — Progress Notes (Signed)
Subjective:    Patient ID: Tony Davila, male    DOB: June 17, 1981, 31 y.o.   MRN: [id number]  HPI  Mr.  Labrum is a 31 yr old male who presents today for follow up of his pneumonia.  He completed levaquin and notes that overall he is feeling much better.  He reports that his SOB is improved.  He denies associated fever.  He reports that his  cough is improved but is still is productive of yellow brown sputum.    He does report that he has had some muscle cramping around the left lateral rib cage.  Review of Systems See HPI  Past Medical History  Diagnosis Date  . 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 Depression   . Kidney stone   . Anemia   . GERD (gastroesophageal reflux disease)   . Hypertension     not tx for now  . Hiatal hernia   . Jejunal ulcer 2009    anastomotic    History   Social History  . Marital Status: Married    Spouse Name: N/A    Number of Children: 0  . Years of Education: N/A   Occupational History  .     Social History Main Topics  . Smoking status: Current Every Day Smoker -- 12 years    Types: Cigarettes  . Smokeless tobacco: Never Used   Comment: less than 1/2 ppd.  . Alcohol Use: No  . Drug Use: No  . Sexually Active: Not on file   Other Topics Concern  . Not on file   Social History Narrative  . No narrative on file    Past Surgical History  Procedure Date  . Tonsillectomy   . Exploratory laparotomy   . Gastric bypass 2009    Dr. Johna Sheriff  . Upper gastrointestinal endoscopy 02/19/11    normal post-op exam  . Laparoscopy 01/17/2012    Procedure: LAPAROSCOPY DIAGNOSTIC;  Surgeon: Kandis Cocking, MD;  Location: [facility];  Service: General;  Laterality: N/A;  OVERSEW PERFORATED GASTRIC JEJUNAL ULCER    Family History  Problem Relation Age of Onset  . Heart disease Maternal Grandmother   . Hyperlipidemia Maternal Grandmother   . Hypertension Maternal Grandmother   . Mental illness Maternal Grandmother     . Irritable bowel syndrome Father   . Irritable bowel syndrome Brother   . Colon cancer Neg Hx     No Known Allergies  Current Outpatient Prescriptions on File Prior to Visit  Medication Sig Dispense Refill  . albuterol (VENTOLIN HFA) 108 (90 BASE) MCG/ACT inhaler Inhale 2 puffs into the lungs every 6 (six) hours as needed. As needed for shortness of breath.      . chlorpheniramine-HYDROcodone (TUSSIONEX) 10-8 MG/5ML LQCR Take 5 mLs by mouth every 12 (twelve) hours as needed.  115 mL  0  . Fluticasone-Salmeterol (ADVAIR DISKUS) 250-50 MCG/DOSE AEPB Inhale 1 puff into the lungs 2 (two) times daily.  1 each  0  . levofloxacin (LEVAQUIN) 500 MG tablet Take 1 tablet (500 mg total) by mouth daily.  7 tablet  0  . omeprazole (PRILOSEC) 40 MG capsule TAKE 1 CAPSULE (40 MG TOTAL) BY MOUTH DAILY. TAKE 30 MINUTES BEFORE BREAKFAST  30 capsule  5  . sucralfate (CARAFATE) 1 G tablet Take 1 tablet (1 g total) by mouth 4 (four) times daily -  with meals and at bedtime.  120 tablet  3  . topiramate (  TOPAMAX) 50 MG tablet Take 1 tablet (50 mg total) by mouth 2 (two) times daily.  60 tablet  5    BP 110/58  Pulse 81  Temp 98.5 F (36.9 C) (Oral)  Resp 16  Wt 162 lb 3.5 oz (73.583 kg)  SpO2 98%       Objective:   Physical Exam  Constitutional: He is oriented to person, place, and time. He appears well-developed and well-nourished. No distress.  Cardiovascular: Normal rate and regular rhythm.   No murmur heard. Pulmonary/Chest: Effort normal and breath sounds normal. No respiratory distress. He has no wheezes. He has no rales. He exhibits no tenderness.  Musculoskeletal: He exhibits no edema.  Neurological: He is alert and oriented to person, place, and time.  Psychiatric: He has a normal mood and affect. His behavior is normal. Judgment and thought content normal.          Assessment & Plan:

## 2012-08-17 ENCOUNTER — Telehealth: Payer: Self-pay | Admitting: Family

## 2012-08-17 DIAGNOSIS — J189 Pneumonia, unspecified organism: Secondary | ICD-10-CM

## 2012-08-17 NOTE — Telephone Encounter (Signed)
Spoke with pt.  He reports that he is feeling better. Reviewed cxr results and recommended that he repeat cxr in 3 weeks on the first floor.  Call sooner if sob, fever, worsening cough.  Pt verbalizes understanding.

## 2012-09-13 IMAGING — CT CT ABD-PELV W/ CM
2 of 4 series · 16 of 46 positions shown, 18 images · IV contrast (APPLIED)
Comparison: 05/13/2011

CLINICAL DATA: 31-year-old male with abdominal pelvic pain.
History of gastric bypass.

CT ABDOMEN AND PELVIS WITH CONTRAST
TECHNIQUE: Multidetector CT imaging of the abdomen and pelvis was
performed following the standard protocol during bolus
administration of intravenous contrast.
Contrast:  100 ml intravenous [id number]

[Series 2: abd/pelvis 5.0 b31f · axial · 0.65mm/px · z∈[+706,+1060]mm · 13 of 79 slices shown, 15 images]
[im 4/79  soft-tissue]
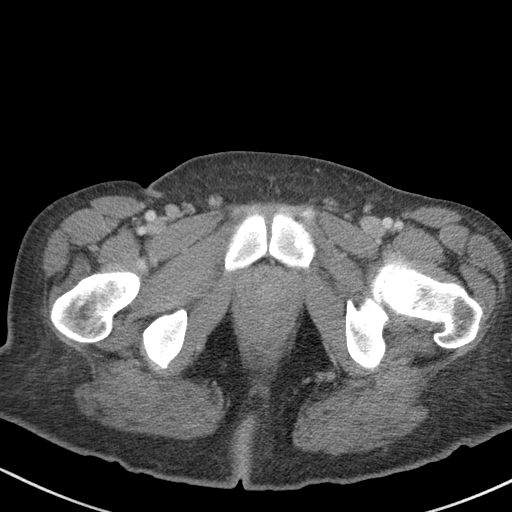
[im 4/79  bone]
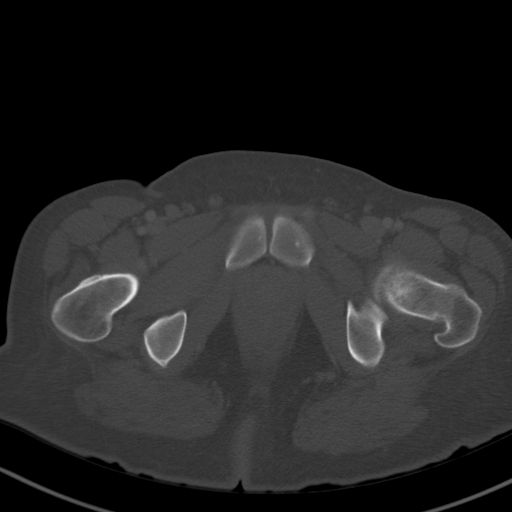
[im 10/79  soft-tissue]
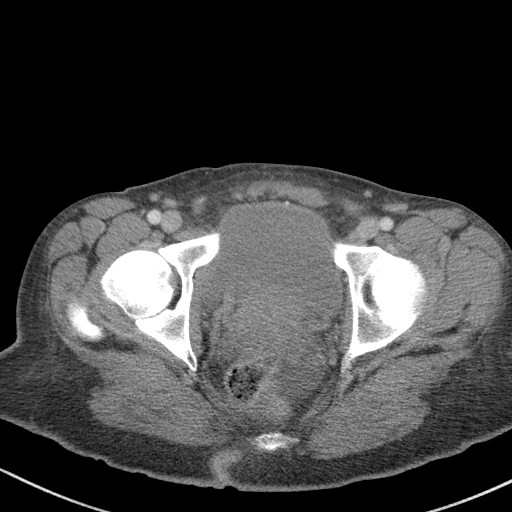
[im 17/79  soft-tissue]
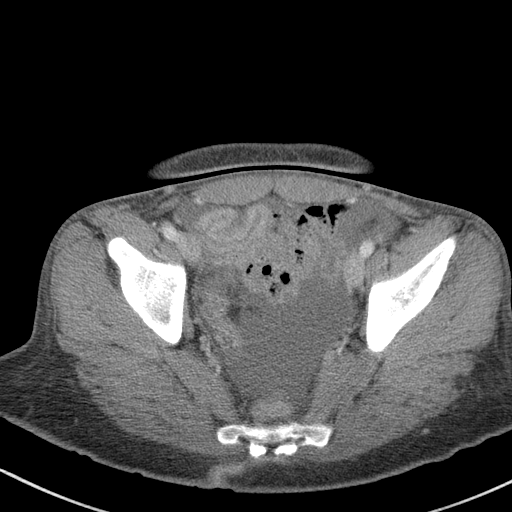
[im 23/79  soft-tissue]
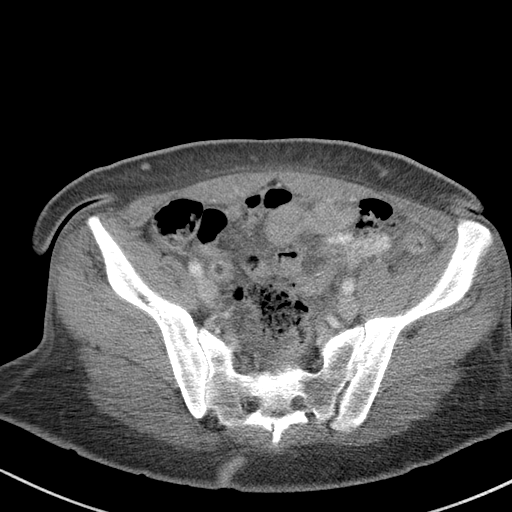
[im 27/79  soft-tissue]
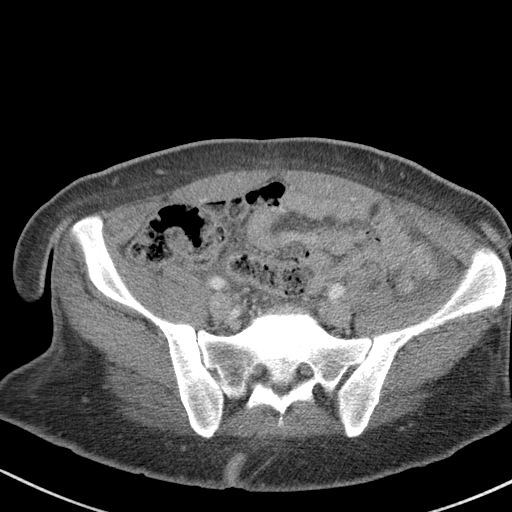
[im 33/79  soft-tissue]
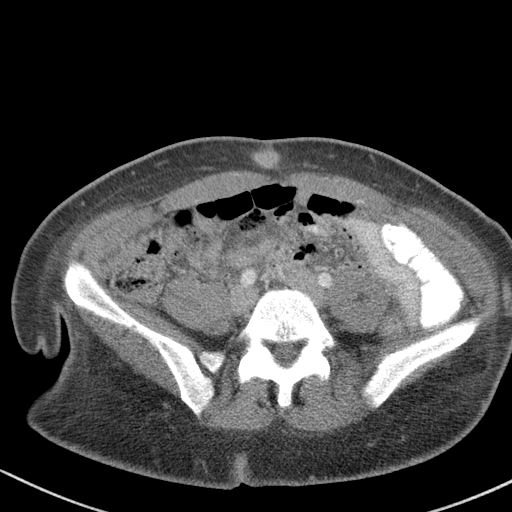
[im 40/79  soft-tissue]
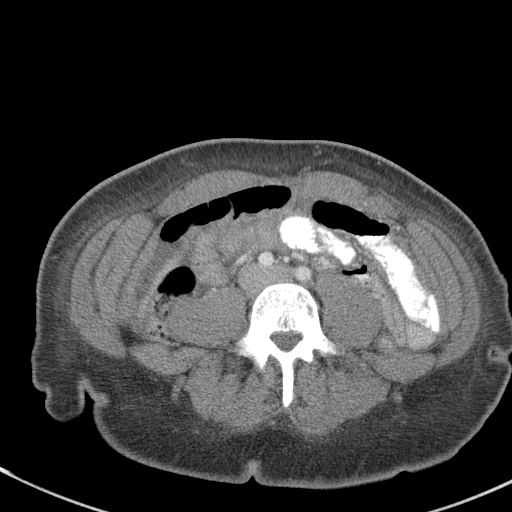
[im 46/79  soft-tissue]
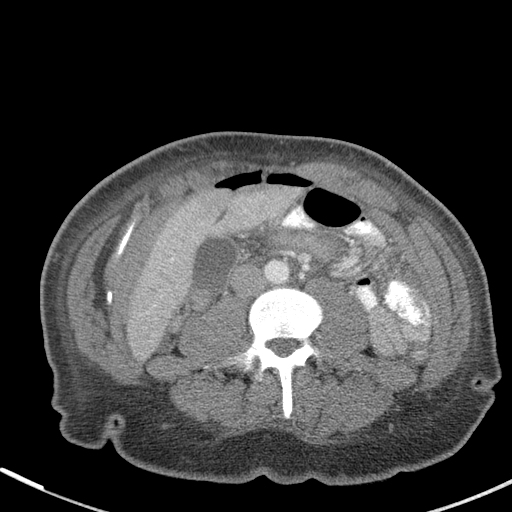
[im 53/79  soft-tissue]
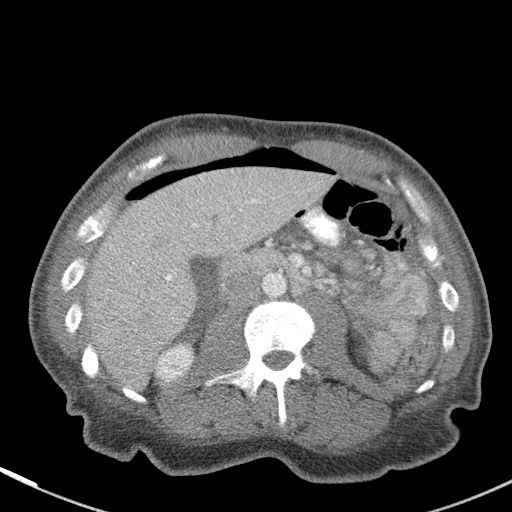
[im 53/79  bone]
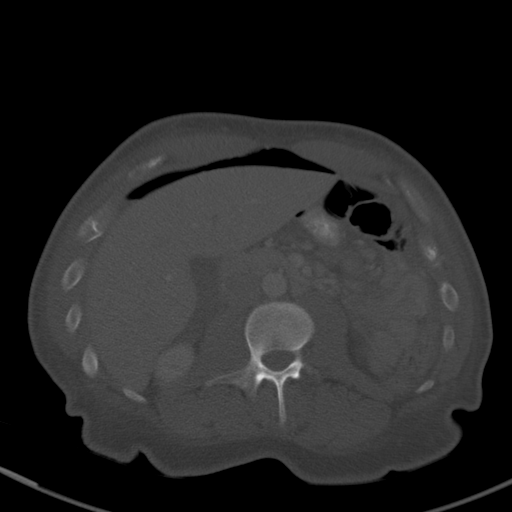
[im 56/79  soft-tissue]
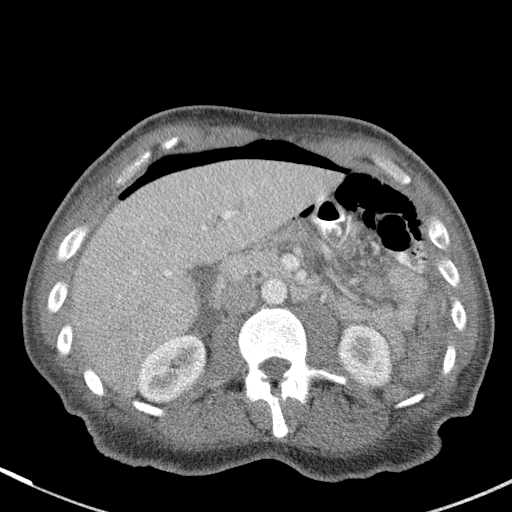
[im 62/79  soft-tissue]
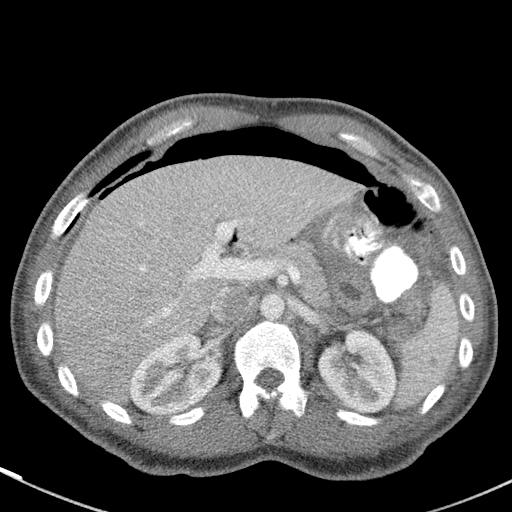
[im 69/79  soft-tissue]
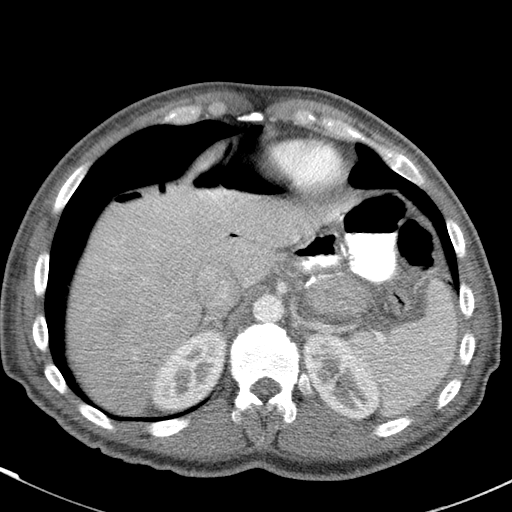
[im 75/79  soft-tissue]
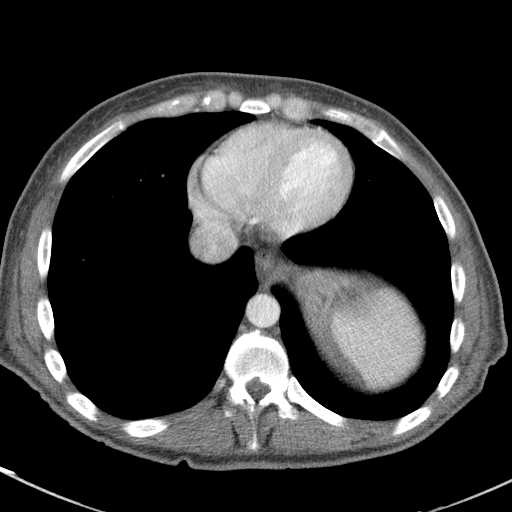

[Series 5: abd/pelvis 3.0 coronal · coronal · 0.66mm/px · 3 of 92 slices shown]
[im 31/92  soft-tissue]
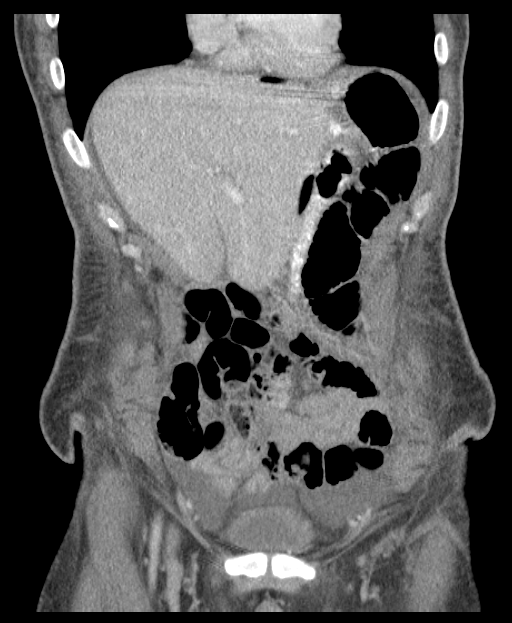
[im 41/92  soft-tissue]
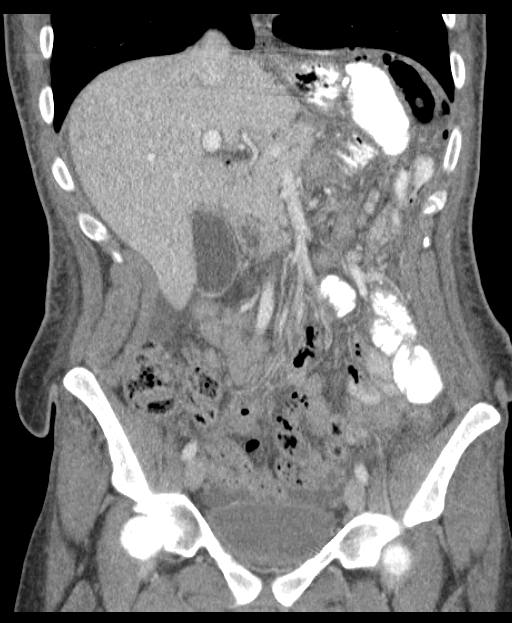
[im 51/92  soft-tissue]
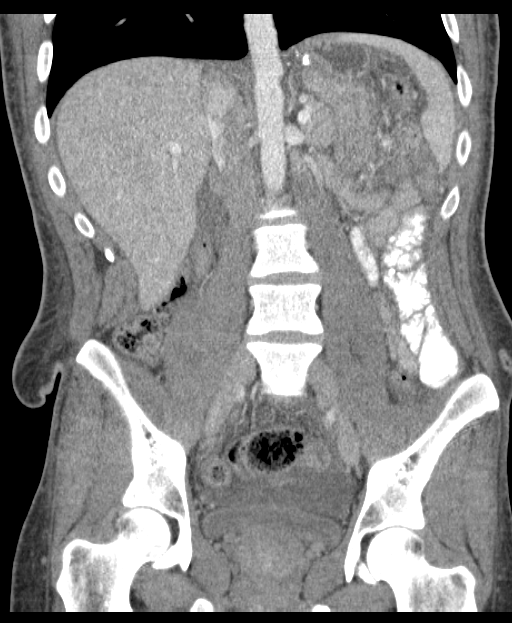

[16 of 46 positions shown; findings below may reference images not displayed]

FINDINGS: A moderate amount of pneumoperitoneum is identified,
primarily within the upper abdomen and compatible with bowel
perforation.  Although the source of this free air is not
definitely identified, the bowel perforation is likely at or near
the gastric bypass.
A moderate amount of free fluid is identified, primarily within the
pelvis.

The liver, gallbladder, spleen, kidneys, gallbladder, pancreas and
adrenal glands are unremarkable.

There is no evidence of biliary dilatation, enlarged lymph nodes or
abdominal aortic aneurysm.
The bladder is within normal limits.

No acute or suspicious bony abnormalities are identified.
IMPRESSION: Moderate pneumoperitoneum within the upper abdomen compatible with
bowel perforation, which likely is at or near the gastric bypass.

Moderate free fluid in the pelvis.

These results were called to Dr. Schwab on 01/17/2012 at [DATE] p.m..

## 2013-01-11 ENCOUNTER — Telehealth: Payer: Self-pay | Admitting: Internal Medicine

## 2013-01-11 ENCOUNTER — Encounter: Payer: Self-pay | Admitting: Family

## 2013-01-11 ENCOUNTER — Ambulatory Visit (INDEPENDENT_AMBULATORY_CARE_PROVIDER_SITE_OTHER): Payer: BC Managed Care – PPO | Admitting: Family

## 2013-01-11 VITALS — BP 120/80 | HR 64 | Temp 98.5°F | Wt 150.5 lb

## 2013-01-11 DIAGNOSIS — J209 Acute bronchitis, unspecified: Secondary | ICD-10-CM

## 2013-01-11 DIAGNOSIS — Z720 Tobacco use: Secondary | ICD-10-CM

## 2013-01-11 DIAGNOSIS — F17200 Nicotine dependence, unspecified, uncomplicated: Secondary | ICD-10-CM

## 2013-01-11 DIAGNOSIS — I83893 Varicose veins of bilateral lower extremities with other complications: Secondary | ICD-10-CM

## 2013-01-11 MED ORDER — TRAMADOL HCL 50 MG PO TABS: 50.0000 mg | ORAL_TABLET | Freq: Three times a day (TID) | ORAL | Status: DC | PRN

## 2013-01-11 MED ORDER — PREDNISONE 20 MG PO TABS: ORAL_TABLET | ORAL | Status: DC

## 2013-01-11 NOTE — Progress Notes (Signed)
Subjective:    Patient ID: Tony Davila, male    DOB: Jan 26, 1981, 32 y.o.   MRN: [id number]  HPI 32 year old WM, 1ppd smoker, patient of Dr. Rodena Medin is in today with c/o cough, congestion x 4 days. Has not been taking anything OTC.   Has c/o leg pain and varicose veins that have become more painful over the last 2 months. Symptoms occurred after having gastric bypass surgery and losing 150lbs. Has been taking Ibuprofen with no relief. Rates pain 8/10.    Review of Systems  Constitutional: Negative.   HENT: Negative.   Respiratory: Positive for cough.   Cardiovascular: Negative.   Gastrointestinal: Negative.   Musculoskeletal: Negative.   Skin: Negative.        Varicose veins   Hematological: Negative.   Psychiatric/Behavioral: Negative.    Past Medical History  Diagnosis Date  . 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 Depression   . Kidney stone   . Anemia   . GERD (gastroesophageal reflux disease)   . Hypertension     not tx for now  . Hiatal hernia   . Jejunal ulcer 2009    anastomotic    History   Social History  . Marital Status: Married    Spouse Name: N/A    Number of Children: 0  . Years of Education: N/A   Occupational History  .     Social History Main Topics  . Smoking status: Current Every Day Smoker -- 12 years    Types: Cigarettes  . Smokeless tobacco: Never Used     Comment: less than 1/2 ppd.  . Alcohol Use: No  . Drug Use: No  . Sexually Active: Not on file   Other Topics Concern  . Not on file   Social History Narrative  . No narrative on file    Past Surgical History  Procedure Laterality Date  . Tonsillectomy    . Exploratory laparotomy    . Gastric bypass  2009    Dr. Johna Sheriff  . Upper gastrointestinal endoscopy  02/19/11    normal post-op exam  . Laparoscopy  01/17/2012    Procedure: LAPAROSCOPY DIAGNOSTIC;  Surgeon: Kandis Cocking, MD;  Location: [facility];  Service: General;  Laterality: N/A;  OVERSEW  PERFORATED GASTRIC JEJUNAL ULCER    Family History  Problem Relation Age of Onset  . Heart disease Maternal Grandmother   . Hyperlipidemia Maternal Grandmother   . Hypertension Maternal Grandmother   . Mental illness Maternal Grandmother   . Irritable bowel syndrome Father   . Irritable bowel syndrome Brother   . Colon cancer Neg Hx     No Known Allergies  Current Outpatient Prescriptions on File Prior to Visit  Medication Sig Dispense Refill  . albuterol (VENTOLIN HFA) 108 (90 BASE) MCG/ACT inhaler Inhale 2 puffs into the lungs every 6 (six) hours as needed. As needed for shortness of breath.      Marland Kitchen omeprazole (PRILOSEC) 40 MG capsule TAKE 1 CAPSULE (40 MG TOTAL) BY MOUTH DAILY. TAKE 30 MINUTES BEFORE BREAKFAST  30 capsule  5  . sucralfate (CARAFATE) 1 G tablet Take 1 tablet (1 g total) by mouth 4 (four) times daily -  with meals and at bedtime.  120 tablet  3  . chlorpheniramine-HYDROcodone (TUSSIONEX) 10-8 MG/5ML LQCR Take 5 mLs by mouth every 12 (twelve) hours as needed.  115 mL  0  . Fluticasone-Salmeterol (ADVAIR DISKUS) 250-50 MCG/DOSE AEPB Inhale  1 puff into the lungs 2 (two) times daily.  1 each  0  . topiramate (TOPAMAX) 50 MG tablet Take 1 tablet (50 mg total) by mouth 2 (two) times daily.  60 tablet  5   No current facility-administered medications on file prior to visit.    BP 120/80  Pulse 64  Temp(Src) 98.5 F (36.9 C) (Oral)  Wt 150 lb 8 oz (68.266 kg)  BMI 24.3 kg/m2  SpO2 98%chart    Objective:   Physical Exam  Constitutional: He is oriented to person, place, and time. He appears well-developed and well-nourished.  HENT:  Right Ear: External ear normal.  Left Ear: External ear normal.  Nose: Nose normal.  Mouth/Throat: Oropharynx is clear and moist.  Neck: Normal range of motion. Neck supple.  Cardiovascular: Normal rate, regular rhythm and normal heart sounds.   Pulmonary/Chest: Effort normal and breath sounds normal.  Neurological: He is alert and  oriented to person, place, and time.  Skin: Skin is warm and dry.  Varicose veins bilaterally. Tender to palpation.   Psychiatric: He has a normal mood and affect.          Assessment & Plan:  Assessment: 1. Bronchitis-acute 2. Varicose Vein  Plan: Prednisone 60x 3, 40x3, 20x3. Refer to vascular. Support hose as tolerated. Call the office if symptoms worsen or persist. Recheck as scheduled and as needed.

## 2013-01-11 NOTE — Telephone Encounter (Signed)
Opened in error

## 2013-01-11 NOTE — Patient Instructions (Addendum)
Varicose Veins Varicose veins are veins that have become enlarged and twisted. CAUSES This condition is the result of valves in the veins not working properly. Valves in the veins help return blood from the leg to the heart. If these valves are damaged, blood flows backwards and backs up into the veins in the leg near the skin. This causes the veins to become larger. People who are on their feet a lot, who are pregnant, or who are overweight are more likely to develop varicose veins. SYMPTOMS   Bulging, twisted-appearing, bluish veins, most commonly found on the legs.  Leg pain or a feeling of heaviness. These symptoms may be worse at the end of the day.  Leg swelling.  Skin color changes. DIAGNOSIS  Varicose veins can usually be diagnosed with an exam of your legs by your caregiver. He or she may recommend an ultrasound of your leg veins. TREATMENT  Most varicose veins can be treated at home.However, other treatments are available for people who have persistent symptoms or who want to treat the cosmetic appearance of the varicose veins. These include:  Laser treatment of very small varicose veins.  Medicine that is shot (injected) into the vein. This medicine hardens the walls of the vein and closes off the vein. This treatment is called sclerotherapy. Afterwards, you may need to wear clothing or bandages that apply pressure.  Surgery. HOME CARE INSTRUCTIONS   Do not stand or sit in one position for long periods of time. Do not sit with your legs crossed. Rest with your legs raised during the day.  Wear elastic stockings or support hose. Do not wear other tight, encircling garments around the legs, pelvis, or waist.  Walk as much as possible to increase blood flow.  Raise the foot of your bed at night with 2-inch blocks.  If you get a cut in the skin over the vein and the vein bleeds, lie down with your leg raised and press on it with a clean cloth until the bleeding stops. Then  place a bandage (dressing) on the cut. See your caregiver if it continues to bleed or needs stitches. SEEK MEDICAL CARE IF:   The skin around your ankle starts to break down.  You have pain, redness, tenderness, or hard swelling developing in your leg over a vein.  You are uncomfortable due to leg pain. Document Released: 08/21/2005 Document Revised: 02/03/2012 Document Reviewed: 01/07/2011 [facility] Patient Information 2013 [place], [place].

## 2013-01-15 ENCOUNTER — Encounter: Payer: Self-pay | Admitting: Vascular Surgery

## 2013-01-18 ENCOUNTER — Encounter: Payer: Self-pay | Admitting: Vascular Surgery

## 2013-01-26 ENCOUNTER — Ambulatory Visit: Payer: Self-pay | Admitting: Family Medicine

## 2013-02-04 ENCOUNTER — Ambulatory Visit (INDEPENDENT_AMBULATORY_CARE_PROVIDER_SITE_OTHER): Payer: BC Managed Care – PPO | Admitting: Family Medicine

## 2013-02-04 ENCOUNTER — Encounter: Payer: Self-pay | Admitting: Family Medicine

## 2013-02-04 VITALS — BP 118/70 | Temp 98.0°F | Wt 154.0 lb

## 2013-02-04 DIAGNOSIS — F39 Unspecified mood [affective] disorder: Secondary | ICD-10-CM

## 2013-02-04 DIAGNOSIS — I83893 Varicose veins of bilateral lower extremities with other complications: Secondary | ICD-10-CM

## 2013-02-04 DIAGNOSIS — I83813 Varicose veins of bilateral lower extremities with pain: Secondary | ICD-10-CM

## 2013-02-04 DIAGNOSIS — I83819 Varicose veins of unspecified lower extremities with pain: Secondary | ICD-10-CM | POA: Insufficient documentation

## 2013-02-04 NOTE — Progress Notes (Signed)
  Subjective:    Patient ID: Tony Davila, male    DOB: October 12, 1981, 32 y.o.   MRN: [id number]  HPI Here to discuss issues with mood lability  Several years ago was treated with antidepressants with Prozac but felt possibly more anxious He was prescribed Pristiq but never filled this because of cost He has frequent swings between anxiety and anxiousness and low mood No suicidal ideation. Wife states he's been somewhat agitated in the past and sometimes has periods of high energy Occasional impulsiveness. Half brother with bipolar disorder. Pt relates in past possibly even some delusional thinking at times.  Patient has history of varicose veins. Previous gastric bypass Has not used support hose. Pain frequently 5/10 severity.  Ultram without much improvement. Pending referral to vein and vascular surgery  Past Medical History  Diagnosis Date  . 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 Depression   . Kidney stone   . Anemia   . GERD (gastroesophageal reflux disease)   . Hypertension     not tx for now  . Hiatal hernia   . Jejunal ulcer 2009    anastomotic   Past Surgical History  Procedure Laterality Date  . Tonsillectomy    . Exploratory laparotomy    . Gastric bypass  2009    Dr. Johna Sheriff  . Upper gastrointestinal endoscopy  02/19/11    normal post-op exam  . Laparoscopy  01/17/2012    Procedure: LAPAROSCOPY DIAGNOSTIC;  Surgeon: Kandis Cocking, MD;  Location: [facility];  Service: General;  Laterality: N/A;  OVERSEW PERFORATED GASTRIC JEJUNAL ULCER    reports that he has been smoking Cigarettes.  He has been smoking about 0.00 packs per day for the past 12 years. He has never used smokeless tobacco. He reports that he does not drink alcohol or use illicit drugs. family history includes Heart disease in his maternal grandmother; Hyperlipidemia in his maternal grandmother; Hypertension in his maternal grandmother; Irritable bowel syndrome in his brother  and father; and Mental illness in his maternal grandmother.  There is no history of Colon cancer. No Known Allergies    Review of Systems  Respiratory: Negative for cough and shortness of breath.   Cardiovascular: Negative for chest pain.  Neurological: Negative for dizziness.  Psychiatric/Behavioral: Positive for dysphoric mood and agitation. Negative for suicidal ideas. The patient is nervous/anxious.        Objective:   Physical Exam  Constitutional: He appears well-developed and well-nourished.  Cardiovascular: Normal rate and regular rhythm.   Pulmonary/Chest: Effort normal and breath sounds normal. No respiratory distress. He has no wheezes. He has no rales.  Musculoskeletal:  Patient has significant varicosities legs bilaterally  Psychiatric: He has a normal mood and affect. His behavior is normal. Thought content normal.          Assessment & Plan:  #1 mood lability. Administer bipolar screening questionnaire Mood Disorder Questionnaire screen positive (ALL section #1 questions answered "yes") Will set up psychiatry referral.  I expressed our concerns about starting antidepressant in setting of possible Bipolar without mood stabilizer.   #2 symptomatic varicose veins.  Discussed support hose. Referral to vein and vascular surgery pending

## 2013-02-04 NOTE — Patient Instructions (Addendum)
Varicose Veins Varicose veins are veins that have become enlarged and twisted. CAUSES This condition is the result of valves in the veins not working properly. Valves in the veins help return blood from the leg to the heart. If these valves are damaged, blood flows backwards and backs up into the veins in the leg near the skin. This causes the veins to become larger. People who are on their feet a lot, who are pregnant, or who are overweight are more likely to develop varicose veins. SYMPTOMS   Bulging, twisted-appearing, bluish veins, most commonly found on the legs.  Leg pain or a feeling of heaviness. These symptoms may be worse at the end of the day.  Leg swelling.  Skin color changes. DIAGNOSIS  Varicose veins can usually be diagnosed with an exam of your legs by your caregiver. He or she may recommend an ultrasound of your leg veins. TREATMENT  Most varicose veins can be treated at home.However, other treatments are available for people who have persistent symptoms or who want to treat the cosmetic appearance of the varicose veins. These include:  Laser treatment of very small varicose veins.  Medicine that is shot (injected) into the vein. This medicine hardens the walls of the vein and closes off the vein. This treatment is called sclerotherapy. Afterwards, you may need to wear clothing or bandages that apply pressure.  Surgery. HOME CARE INSTRUCTIONS   Do not stand or sit in one position for long periods of time. Do not sit with your legs crossed. Rest with your legs raised during the day.  Wear elastic stockings or support hose. Do not wear other tight, encircling garments around the legs, pelvis, or waist.  Walk as much as possible to increase blood flow.  Raise the foot of your bed at night with 2-inch blocks.  If you get a cut in the skin over the vein and the vein bleeds, lie down with your leg raised and press on it with a clean cloth until the bleeding stops. Then  place a bandage (dressing) on the cut. See your caregiver if it continues to bleed or needs stitches. SEEK MEDICAL CARE IF:   The skin around your ankle starts to break down.  You have pain, redness, tenderness, or hard swelling developing in your leg over a vein.  You are uncomfortable due to leg pain. Document Released: 08/21/2005 Document Revised: 02/03/2012 Document Reviewed: 01/07/2011 [employer] Patient Information 2013 [employer]  

## 2013-02-17 ENCOUNTER — Encounter: Payer: Self-pay | Admitting: Family Medicine

## 2013-02-18 ENCOUNTER — Ambulatory Visit (INDEPENDENT_AMBULATORY_CARE_PROVIDER_SITE_OTHER): Payer: BC Managed Care – PPO | Admitting: Family Medicine

## 2013-02-18 ENCOUNTER — Encounter: Payer: Self-pay | Admitting: Family Medicine

## 2013-02-18 VITALS — BP 120/60 | Temp 97.6°F

## 2013-02-18 DIAGNOSIS — S60552A Superficial foreign body of left hand, initial encounter: Secondary | ICD-10-CM

## 2013-02-18 DIAGNOSIS — S60559A Superficial foreign body of unspecified hand, initial encounter: Secondary | ICD-10-CM

## 2013-02-18 MED ORDER — CEPHALEXIN 500 MG PO CAPS: 500.0000 mg | ORAL_CAPSULE | Freq: Three times a day (TID) | ORAL | Status: DC

## 2013-02-18 NOTE — Progress Notes (Signed)
  Subjective:    Patient ID: Tony Davila, male    DOB: [birth year], 32 y.o.   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 He removed a portion of the splinter but unable to get out in entirety. Last tetanus 2012. Moderate pain. No fevers or chills.  Past Medical History  Diagnosis Date  . 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 Depression   . Kidney stone   . Anemia   . GERD (gastroesophageal reflux disease)   . Hypertension     not tx for now  . Hiatal hernia   . Jejunal ulcer 2009    anastomotic   Past Surgical History  Procedure Laterality Date  . Tonsillectomy    . Exploratory laparotomy    . Gastric bypass  2009    Dr. Johna Sheriff  . Upper gastrointestinal endoscopy  02/19/11    normal post-op exam  . Laparoscopy  01/17/2012    Procedure: LAPAROSCOPY DIAGNOSTIC;  Surgeon: Kandis Cocking, MD;  Location: [facility];  Service: General;  Laterality: N/A;  OVERSEW PERFORATED GASTRIC JEJUNAL ULCER    reports that he has been smoking Cigarettes.  He has been smoking about 0.00 packs per day for the past 12 years. He has never used smokeless tobacco. He reports that he does not drink alcohol or use illicit drugs. family history includes Heart disease in his maternal grandmother; Hyperlipidemia in his maternal grandmother; Hypertension in his maternal grandmother; Irritable bowel syndrome in his brother and father; and Mental illness in his maternal grandmother.  There is no history of Colon cancer. No Known Allergies    Review of Systems  Constitutional: Negative for fever and chills.       Objective:   Physical Exam  Constitutional: He appears well-developed and well-nourished.  Cardiovascular: Normal rate and regular rhythm.   Skin:  Left hand reveals minute of splinter which is just proximal to the fourth digit palm of the hand. He has a 1 cm surrounding zone of erythema. No purulent  drainage.          Assessment & Plan:  Foreign body with wood splinter left hand. Tetanus up-to-date. Obtain consent for small incision and removal of splinter after discussion of risk and benefits and patient consents. Prepped hand with Betadine. Anesthesia 2% plain Xylocaine. A small incision with #11 blade. Able to grasp the splinter with forceps and removed in entirety. Wound irrigated with normal saline. Antibiotic ingesting applied. Keflex 500 mg 3 times a day for 7 days

## 2013-02-18 NOTE — Patient Instructions (Addendum)
Follow up promptly for any increased redness, fever, or swelling.

## 2013-02-22 ENCOUNTER — Other Ambulatory Visit: Payer: Self-pay | Admitting: Internal Medicine

## 2013-03-25 DIAGNOSIS — K283 Acute gastrojejunal ulcer without hemorrhage or perforation: Secondary | ICD-10-CM

## 2013-03-25 HISTORY — DX: Acute gastrojejunal ulcer without hemorrhage or perforation: K28.3

## 2013-04-01 ENCOUNTER — Encounter (HOSPITAL_COMMUNITY): Payer: Self-pay | Admitting: *Deleted

## 2013-04-01 ENCOUNTER — Telehealth: Payer: Self-pay | Admitting: Internal Medicine

## 2013-04-01 ENCOUNTER — Emergency Department (HOSPITAL_COMMUNITY)
Admission: EM | Admit: 2013-04-01 | Discharge: 2013-04-02 | Disposition: A | Discharge status: Home / Self Care | Payer: BC Managed Care – HMO | Attending: Emergency Medicine | Admitting: Emergency Medicine

## 2013-04-01 DIAGNOSIS — K219 Gastro-esophageal reflux disease without esophagitis: Secondary | ICD-10-CM | POA: Insufficient documentation

## 2013-04-01 DIAGNOSIS — R110 Nausea: Secondary | ICD-10-CM | POA: Insufficient documentation

## 2013-04-01 DIAGNOSIS — Z8719 Personal history of other diseases of the digestive system: Secondary | ICD-10-CM | POA: Insufficient documentation

## 2013-04-01 DIAGNOSIS — I10 Essential (primary) hypertension: Secondary | ICD-10-CM | POA: Insufficient documentation

## 2013-04-01 DIAGNOSIS — R109 Unspecified abdominal pain: Secondary | ICD-10-CM | POA: Insufficient documentation

## 2013-04-01 DIAGNOSIS — Z87442 Personal history of urinary calculi: Secondary | ICD-10-CM | POA: Insufficient documentation

## 2013-04-01 DIAGNOSIS — Z8659 Personal history of other mental and behavioral disorders: Secondary | ICD-10-CM | POA: Insufficient documentation

## 2013-04-01 DIAGNOSIS — F17200 Nicotine dependence, unspecified, uncomplicated: Secondary | ICD-10-CM | POA: Insufficient documentation

## 2013-04-01 DIAGNOSIS — Z79899 Other long term (current) drug therapy: Secondary | ICD-10-CM | POA: Insufficient documentation

## 2013-04-01 DIAGNOSIS — Z862 Personal history of diseases of the blood and blood-forming organs and certain disorders involving the immune mechanism: Secondary | ICD-10-CM | POA: Insufficient documentation

## 2013-04-01 LAB — URINALYSIS, ROUTINE W REFLEX MICROSCOPIC
Bilirubin Urine: NEGATIVE
Glucose, UA: NEGATIVE mg/dL
Hgb urine dipstick: NEGATIVE
Ketones, ur: NEGATIVE mg/dL
Leukocytes, UA: NEGATIVE
Nitrite: NEGATIVE
Protein, ur: NEGATIVE mg/dL
Specific Gravity, Urine: 1.011 (ref 1.005–1.030)
Urobilinogen, UA: 1 mg/dL (ref 0.0–1.0)
pH: 5.5 (ref 5.0–8.0)

## 2013-04-01 LAB — CBC WITH DIFFERENTIAL/PLATELET
Basophils Absolute: 0 10*3/uL (ref 0.0–0.1)
Basophils Relative: 1 % (ref 0–1)
Eosinophils Absolute: 0.1 10*3/uL (ref 0.0–0.7)
Eosinophils Relative: 2 % (ref 0–5)
HCT: 39.5 % (ref 39.0–52.0)
Lymphocytes Relative: 42 % (ref 12–46)
MCH: 30.9 pg (ref 26.0–34.0)
MCHC: 34.4 g/dL (ref 30.0–36.0)
MCV: 89.8 fL (ref 78.0–100.0)
Monocytes Absolute: 0.6 10*3/uL (ref 0.1–1.0)
Platelets: 177 10*3/uL (ref 150–400)
RDW: 13 % (ref 11.5–15.5)

## 2013-04-01 LAB — COMPREHENSIVE METABOLIC PANEL
ALT: 9 U/L (ref 0–53)
AST: 12 U/L (ref 0–37)
CO2: 30 mEq/L (ref 19–32)
Calcium: 9.7 mg/dL (ref 8.4–10.5)
Creatinine, Ser: 0.7 mg/dL (ref 0.50–1.35)
GFR calc non Af Amer: >90 mL/min (ref >90)
Sodium: 140 mEq/L (ref 135–145)
Total Protein: 6.4 g/dL (ref 6.0–8.3)

## 2013-04-01 MED ORDER — HYDROMORPHONE HCL PF 1 MG/ML IJ SOLN
1.0000 mg | Freq: Once | INTRAMUSCULAR | Status: AC
  Administered 2013-04-01: 1 mg via INTRAVENOUS
  Filled 2013-04-01: qty 1

## 2013-04-01 MED ORDER — ONDANSETRON HCL 4 MG/2ML IJ SOLN
4.0000 mg | Freq: Once | INTRAMUSCULAR | Status: AC
  Administered 2013-04-01: 4 mg via INTRAVENOUS
  Filled 2013-04-01: qty 2

## 2013-04-01 MED ORDER — SODIUM CHLORIDE 0.9 % IV BOLUS (SEPSIS)
1000.0000 mL | Freq: Once | INTRAVENOUS | Status: AC
  Administered 2013-04-01: 1000 mL via INTRAVENOUS

## 2013-04-01 NOTE — Telephone Encounter (Signed)
I CALLED PATIENT AND SPOKE WITH HIS MOM. MOM GAVE ME HER SISTERS PHONE NUMBER TO REACH PATIENT BECAUSE PATIENT IS WITH SISTER---[phone]. LEFT MESSAGE FOR PATIENT TO CALL OFFICE BACK. PATIENT HAS NOT BEEN SEEN SINCE 2012 AND NO SHOWED AN APPOINTMENT IN AUG 2013. REFILL FOR OMEPRAZOLE HAS BEEN DENIED.

## 2013-04-01 NOTE — ED Notes (Signed)
Pt states that he has left flank and LLQ abd pain x 7 days; pt reports that the pain has been present for the last week and has progressively gotten worse over the last 24 hrs; pt states initially Omperazole was helping his discomfort but not helping over the last few days; pt states that he has a history of perf. ulce and this feels similar; pt also with hx of kidney stones

## 2013-04-01 NOTE — ED Provider Notes (Signed)
History     CSN: [id number]  Arrival date & time 04/01/13  2157   First MD Initiated Contact with Patient 04/01/13 2222      Chief Complaint  Patient presents with  . Flank Pain  . Abdominal Pain    (Consider location/radiation/quality/duration/timing/severity/associated sxs/prior treatment) HPI Patient is a 32 y.o male with history of gastric bypass, ruptured jejunal ulcer, GERD, and nephrolithiasis who presents to emergency department with left-sided abdominal pain.  Pain began 7 days ago and is constant, sharp, and worsening.  Pain is associated with nausea.  Patient denies diarrhea, hematochezia, melena, dysuria, hematuria, fever, lightheadedness, dizziness.  Patient states pain is similar to perforated ulcer.  It does not feel like his past kidney stones.  Patient takes omeprazole and carafate daily.  He has also taken tylenol for pain with minimal relief.   Past Medical History  Diagnosis Date  . 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 Depression   . Kidney stone   . Anemia   . GERD (gastroesophageal reflux disease)   . Hypertension     not tx for now  . Hiatal hernia   . Jejunal ulcer 2009    anastomotic    Past Surgical History  Procedure Laterality Date  . Tonsillectomy    . Exploratory laparotomy    . Gastric bypass  2009    Dr. Johna Sheriff  . Upper gastrointestinal endoscopy  02/19/11    normal post-op exam  . Laparoscopy  01/17/2012    Procedure: LAPAROSCOPY DIAGNOSTIC;  Surgeon: Kandis Cocking, MD;  Location: [facility];  Service: General;  Laterality: N/A;  OVERSEW PERFORATED GASTRIC JEJUNAL ULCER    Family History  Problem Relation Age of Onset  . Heart disease Maternal Grandmother   . Hyperlipidemia Maternal Grandmother   . Hypertension Maternal Grandmother   . Mental illness Maternal Grandmother   . Irritable bowel syndrome Father   . Irritable bowel syndrome Brother   . Colon cancer Neg Hx     History  Substance Use Topics  .  Smoking status: Current Every Day Smoker -- 12 years    Types: Cigarettes  . Smokeless tobacco: Never Used     Comment: less than 1/2 ppd.  . Alcohol Use: No      Review of Systems All other systems negative except as documented in the HPI. All pertinent positives and negatives as reviewed in the HPI.   Allergies  Review of patient's allergies indicates no known allergies.  Home Medications   Current Outpatient Rx  Name  Route  Sig  Dispense  Refill  . omeprazole (PRILOSEC) 40 MG capsule      TAKE 1 CAPSULE (40 MG TOTAL) BY MOUTH DAILY. TAKE 30 MINUTES BEFORE BREAKFAST   30 capsule   5   . sucralfate (CARAFATE) 1 G tablet   Oral   Take 1 tablet (1 g total) by mouth 4 (four) times daily -  with meals and at bedtime.   120 tablet   3   . albuterol (VENTOLIN HFA) 108 (90 BASE) MCG/ACT inhaler   Inhalation   Inhale 2 puffs into the lungs every 6 (six) hours as needed. As needed for shortness of breath.           BP 122/79  Pulse 53  Temp(Src) 98 F (36.7 C) (Oral)  Resp 18  Wt 150 lb (68.04 kg)  BMI 24.22 kg/m2  SpO2 100%  Physical Exam  Constitutional: He is  oriented to person, place, and time. He appears well-developed and well-nourished. No distress.  HENT:  Head: Normocephalic and atraumatic.  Mouth/Throat: Oropharynx is clear and moist.  Eyes: Conjunctivae are normal. Pupils are equal, round, and reactive to light.  Neck: Normal range of motion. Neck supple.  Cardiovascular: Normal rate, regular rhythm, normal heart sounds and intact distal pulses.  Exam reveals no gallop and no friction rub.   No murmur heard. Pulmonary/Chest: Effort normal and breath sounds normal. No respiratory distress. He has no wheezes. He exhibits no tenderness.  Abdominal: Soft. Bowel sounds are normal. He exhibits no distension and no mass. There is tenderness in the left upper quadrant. There is no CVA tenderness.    Questionable rebound tenderness.  Musculoskeletal: He  exhibits no edema.  Lymphadenopathy:    He has no cervical adenopathy.  Neurological: He is alert and oriented to person, place, and time.  Skin: Skin is warm and dry.    ED Course  Procedures (including critical care time)  Labs Reviewed  CBC WITH DIFFERENTIAL  COMPREHENSIVE METABOLIC PANEL  LIPASE, BLOOD  The patient will be referred back to his GI doc and PCP. Told to return here as needed.     MDM  MDM Reviewed: nursing note, vitals and previous chart Interpretation: labs and CT scan           Carlyle Dolly, PA-C 04/02/13 0204

## 2013-04-01 NOTE — ED Notes (Signed)
Pt aware urine sample is needed 

## 2013-04-02 ENCOUNTER — Emergency Department (HOSPITAL_COMMUNITY): Payer: BC Managed Care – HMO

## 2013-04-02 ENCOUNTER — Encounter (HOSPITAL_COMMUNITY): Payer: Self-pay

## 2013-04-02 ENCOUNTER — Telehealth: Payer: Self-pay | Admitting: Internal Medicine

## 2013-04-02 MED ORDER — IOHEXOL 300 MG/ML  SOLN
100.0000 mL | Freq: Once | INTRAMUSCULAR | Status: AC | PRN
  Administered 2013-04-02: 100 mL via INTRAVENOUS

## 2013-04-02 MED ORDER — HYDROCODONE-ACETAMINOPHEN 5-325 MG PO TABS: 1.0000 | ORAL_TABLET | Freq: Four times a day (QID) | ORAL | Status: DC | PRN

## 2013-04-02 MED ORDER — OMEPRAZOLE 40 MG PO CPDR: 40.0000 mg | DELAYED_RELEASE_CAPSULE | Freq: Every day | ORAL | Status: DC

## 2013-04-02 MED ORDER — IOHEXOL 300 MG/ML  SOLN
50.0000 mL | Freq: Once | INTRAMUSCULAR | Status: AC | PRN
  Administered 2013-04-02: 50 mL via ORAL

## 2013-04-02 NOTE — ED Provider Notes (Signed)
Medical screening examination/treatment/procedure(s) were performed by non-physician practitioner and as supervising physician I was immediately available for consultation/collaboration.  Jones Skene, M.D.      Jones Skene, MD 04/02/13 [phone]

## 2013-04-02 NOTE — Telephone Encounter (Signed)
Patient is scheduled for 04/23/13 4:00, I sent in 1 rx for omeprazole until office visit

## 2013-04-03 ENCOUNTER — Emergency Department (HOSPITAL_COMMUNITY)
Admission: EM | Admit: 2013-04-03 | Discharge: 2013-04-04 | Disposition: A | Discharge status: Home / Self Care | Payer: BC Managed Care – PPO | Attending: Emergency Medicine | Admitting: Emergency Medicine

## 2013-04-03 ENCOUNTER — Encounter (HOSPITAL_COMMUNITY): Payer: Self-pay | Admitting: *Deleted

## 2013-04-03 ENCOUNTER — Emergency Department (HOSPITAL_COMMUNITY): Payer: BC Managed Care – PPO

## 2013-04-03 DIAGNOSIS — R109 Unspecified abdominal pain: Secondary | ICD-10-CM

## 2013-04-03 DIAGNOSIS — Z9884 Bariatric surgery status: Secondary | ICD-10-CM | POA: Insufficient documentation

## 2013-04-03 DIAGNOSIS — Z8719 Personal history of other diseases of the digestive system: Secondary | ICD-10-CM | POA: Insufficient documentation

## 2013-04-03 DIAGNOSIS — K219 Gastro-esophageal reflux disease without esophagitis: Secondary | ICD-10-CM | POA: Insufficient documentation

## 2013-04-03 DIAGNOSIS — I10 Essential (primary) hypertension: Secondary | ICD-10-CM | POA: Insufficient documentation

## 2013-04-03 DIAGNOSIS — R110 Nausea: Secondary | ICD-10-CM | POA: Insufficient documentation

## 2013-04-03 DIAGNOSIS — F3289 Other specified depressive episodes: Secondary | ICD-10-CM | POA: Insufficient documentation

## 2013-04-03 DIAGNOSIS — Z79899 Other long term (current) drug therapy: Secondary | ICD-10-CM | POA: Insufficient documentation

## 2013-04-03 DIAGNOSIS — R630 Anorexia: Secondary | ICD-10-CM | POA: Insufficient documentation

## 2013-04-03 DIAGNOSIS — F17200 Nicotine dependence, unspecified, uncomplicated: Secondary | ICD-10-CM | POA: Insufficient documentation

## 2013-04-03 DIAGNOSIS — F329 Major depressive disorder, single episode, unspecified: Secondary | ICD-10-CM | POA: Insufficient documentation

## 2013-04-03 DIAGNOSIS — M549 Dorsalgia, unspecified: Secondary | ICD-10-CM | POA: Insufficient documentation

## 2013-04-03 DIAGNOSIS — Z862 Personal history of diseases of the blood and blood-forming organs and certain disorders involving the immune mechanism: Secondary | ICD-10-CM | POA: Insufficient documentation

## 2013-04-03 DIAGNOSIS — Z87442 Personal history of urinary calculi: Secondary | ICD-10-CM | POA: Insufficient documentation

## 2013-04-03 LAB — CBC WITH DIFFERENTIAL/PLATELET
HCT: 40.5 % (ref 39.0–52.0)
Hemoglobin: 14.4 g/dL (ref 13.0–17.0)
Lymphocytes Relative: 33 % (ref 12–46)
Lymphs Abs: 1.7 10*3/uL (ref 0.7–4.0)
Monocytes Absolute: 0.6 10*3/uL (ref 0.1–1.0)
Monocytes Relative: 11 % (ref 3–12)
Neutro Abs: 2.9 10*3/uL (ref 1.7–7.7)
WBC: 5.3 10*3/uL (ref 4.0–10.5)

## 2013-04-03 LAB — COMPREHENSIVE METABOLIC PANEL
AST: 16 U/L (ref 0–37)
Alkaline Phosphatase: 118 U/L — ABNORMAL HIGH (ref 39–117)
BUN: 11 mg/dL (ref 6–23)
CO2: 27 mEq/L (ref 19–32)
Chloride: 104 mEq/L (ref 96–112)
Creatinine, Ser: 0.54 mg/dL (ref 0.50–1.35)
GFR calc Af Amer: >90 mL/min (ref >90)
Glucose, Bld: 90 mg/dL (ref 70–99)

## 2013-04-03 MED ORDER — HYDROMORPHONE HCL PF 1 MG/ML IJ SOLN
1.0000 mg | Freq: Once | INTRAMUSCULAR | Status: AC
  Administered 2013-04-03: 1 mg via INTRAVENOUS
  Filled 2013-04-03: qty 1

## 2013-04-03 MED ORDER — ONDANSETRON HCL 4 MG/2ML IJ SOLN
4.0000 mg | Freq: Once | INTRAMUSCULAR | Status: AC
  Administered 2013-04-03: 4 mg via INTRAVENOUS
  Filled 2013-04-03: qty 2

## 2013-04-03 MED ORDER — FAMOTIDINE 20 MG PO TABS
20.0000 mg | ORAL_TABLET | Freq: Once | ORAL | Status: AC
  Administered 2013-04-03: 20 mg via ORAL
  Filled 2013-04-03: qty 1

## 2013-04-03 MED ORDER — PANTOPRAZOLE SODIUM 40 MG IV SOLR
40.0000 mg | Freq: Once | INTRAVENOUS | Status: AC
  Administered 2013-04-03: 40 mg via INTRAVENOUS
  Filled 2013-04-03: qty 40

## 2013-04-03 MED ORDER — SODIUM CHLORIDE 0.9 % IV BOLUS (SEPSIS)
1000.0000 mL | Freq: Once | INTRAVENOUS | Status: AC
  Administered 2013-04-03: 1000 mL via INTRAVENOUS

## 2013-04-03 NOTE — ED Provider Notes (Signed)
History     CSN: [id number]  Arrival date & time 04/03/13  2059   First MD Initiated Contact with Patient 04/03/13 2242      Chief Complaint  Patient presents with  . Flank Pain  . Back Pain    (Consider location/radiation/quality/duration/timing/severity/associated sxs/prior treatment) HPI Comments: 32 y/o male with a PMHx of perforated jejunal ulcer, GERD, hiatal hernia, HTN and kidney stones presents back to the ED after being seen here 2 days ago complaining of continuing and worsening left flank pain radiating around to his LLQ. Pain described as sharp and stabbing rated 9/10, unrelieved by vicodin. States this feels similar to his pain 1 year ago before he had a perforated jejunal ulcer which was repaired by Dr. Ezzard Standing with [facility]. Admits to decreased appetite and nausea without vomiting. Last BM was yesterday and was normal. No urinary symptoms. Denies fever or chills. Takes carafate and omeprazole as prescribed. Gastric bypass in 2009, exploratory laparotomy 01/17/2012 due to pneumoperitoneum and perforated ulcer.  Patient is a 32 y.o. male presenting with flank pain and back pain. The history is provided by the patient and the spouse.  Flank Pain Associated symptoms include abdominal pain and nausea. Pertinent negatives include no chills, fever or vomiting.  Back Pain Associated symptoms: abdominal pain   Associated symptoms: no fever     Past Medical History  Diagnosis Date  . 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 Depression   . Kidney stone   . Anemia   . GERD (gastroesophageal reflux disease)   . Hypertension     not tx for now  . Hiatal hernia   . Jejunal ulcer 2009    anastomotic    Past Surgical History  Procedure Laterality Date  . Tonsillectomy    . Exploratory laparotomy    . Gastric bypass  2009    Dr. Johna Sheriff  . Upper gastrointestinal endoscopy  02/19/11    normal post-op exam  . Laparoscopy  01/17/2012   Procedure: LAPAROSCOPY DIAGNOSTIC;  Surgeon: Kandis Cocking, MD;  Location: [facility];  Service: General;  Laterality: N/A;  OVERSEW PERFORATED GASTRIC JEJUNAL ULCER    Family History  Problem Relation Age of Onset  . Heart disease Maternal Grandmother   . Hyperlipidemia Maternal Grandmother   . Hypertension Maternal Grandmother   . Mental illness Maternal Grandmother   . Irritable bowel syndrome Father   . Irritable bowel syndrome Brother   . Colon cancer Neg Hx     History  Substance Use Topics  . Smoking status: Current Every Day Smoker -- 12 years    Types: Cigarettes  . Smokeless tobacco: Never Used     Comment: less than 1/2 ppd.  . Alcohol Use: No      Review of Systems  Constitutional: Negative for fever and chills.  Gastrointestinal: Positive for nausea and abdominal pain. Negative for vomiting.  Genitourinary: Positive for flank pain. Negative for urgency, frequency, hematuria, scrotal swelling, difficulty urinating and testicular pain.  Musculoskeletal: Positive for back pain.  All other systems reviewed and are negative.    Allergies  Review of patient's allergies indicates no known allergies.  Home Medications   Current Outpatient Rx  Name  Route  Sig  Dispense  Refill  . albuterol (VENTOLIN HFA) 108 (90 BASE) MCG/ACT inhaler   Inhalation   Inhale 2 puffs into the lungs every 6 (six) hours as needed. As needed for shortness of breath.         Marland Kitchen  HYDROcodone-acetaminophen (NORCO/VICODIN) 5-325 MG per tablet   Oral   Take 1 tablet by mouth every 6 (six) hours as needed for pain.   15 tablet   0   . omeprazole (PRILOSEC) 40 MG capsule   Oral   Take 1 capsule (40 mg total) by mouth daily.   30 capsule   0   . sucralfate (CARAFATE) 1 G tablet   Oral   Take 1 tablet (1 g total) by mouth 4 (four) times daily -  with meals and at bedtime.   120 tablet   3     BP 125/63  Pulse 73  Temp(Src) 98.1 F (36.7 C) (Oral)  Resp 18  SpO2 100%  Physical  Exam  Nursing note and vitals reviewed. Constitutional: He is oriented to person, place, and time. He appears well-developed and well-nourished. No distress.  HENT:  Head: Normocephalic and atraumatic.  Mouth/Throat: Oropharynx is clear and moist.  Eyes: Conjunctivae are normal. No scleral icterus.  Neck: Normal range of motion. Neck supple.  Cardiovascular: Normal rate, regular rhythm, normal heart sounds and intact distal pulses.   Pulmonary/Chest: Effort normal and breath sounds normal. No respiratory distress. He has no decreased breath sounds.  Abdominal: He exhibits no distension. Bowel sounds are increased. There is tenderness in the left upper quadrant and left lower quadrant. There is guarding. There is no rigidity, no rebound and no CVA tenderness.    Musculoskeletal: Normal range of motion. He exhibits no edema.  Neurological: He is alert and oriented to person, place, and time.  Skin: Skin is warm and dry. He is not diaphoretic.  Psychiatric: He has a normal mood and affect. His behavior is normal.    ED Course  Procedures (including critical care time)  Labs Reviewed  URINALYSIS, ROUTINE W REFLEX MICROSCOPIC - Abnormal; Notable for the following:    Color, Urine AMBER (*)    Specific Gravity, Urine 1.033 (*)    Bilirubin Urine SMALL (*)    All other components within normal limits  COMPREHENSIVE METABOLIC PANEL - Abnormal; Notable for the following:    Alkaline Phosphatase 118 (*)    All other components within normal limits  CBC WITH DIFFERENTIAL   Ct Abdomen Pelvis W Contrast  04/02/2013  *RADIOLOGY REPORT*  Clinical Data: History of perforated jejunal ulcer.  Left flank pain.  CT ABDOMEN AND PELVIS WITH CONTRAST  Technique:  Multidetector CT imaging of the abdomen and pelvis was performed following the standard protocol during bolus administration of intravenous contrast.  Contrast: OMNIPAQUE IOHEXOL 300 MG/ML  SOLN  Comparison: CT 01/15/2012, 05/13/2011   Findings: Lung bases are clear.  No pericardial fluid.  There is postsurgical anatomy consistent with gastric bypass. Contrast flows through the gastric jejunostomy without evidence obstruction.  Contrast flows into the distal small bowel.  Small amount of contrast reaches the colon.  No evidence of leak of oral contrast.  There is no focal hepatic lesion.  Gallbladder, pancreas, spleen, adrenal glands, and kidneys are normal.  There is no free fluid the abdomen.  Small amount free fluid the pelvis is noted.  Prostate gland bladder normal.  No distal ureteral stones or bladder stones. Review of  bone windows demonstrates no aggressive osseous lesions.  IMPRESSION: 1.  No evidence of obstructive uropathy. 2.  Patient status post gastric bypass anatomy without evidence of obstruction. 3.  Small amount free fluid in the pelvis of unclear etiology.   Original Report Authenticated By: Genevive Bi, M.D.  1. Abdominal pain   2. GERD (gastroesophageal reflux disease)       MDM  32 y/o male returning for continuing abdominal pain. Guarding on initial abdominal exam. 2 view abdominal and chest xrays obtained which did not show evidence of free air. Patient in NAD. Given dilaudid, zofran, fluids, pepcid and protonix. Pain improved with this medication regimen and repeat abdominal exam still with tenderness, however with clinical improvement and no guarding. Labs unremarkable, U/A without blood. CT scan obtained 2 days ago unremarkable, without any acute abnormalities. I discussed patient with Dr. Dierdre Highman who also evaluated him and feels as if his symptoms are coming from GERD as patient admits to ibuprofen use and smoking when advised against s/p lap band. He will be discharged home and will f/u with PCP and his GI specialist Dr. Leone Payor. Return precautions discussed. Patient states understanding of plan and is agreeable.         Trevor Mace, PA-C 04/04/13 [phone]

## 2013-04-03 NOTE — ED Notes (Signed)
Pt c/o L flank and low back pain, denies relief from rx'd vicodin. Pt states decreased appetite but no vomiting.

## 2013-04-03 NOTE — ED Notes (Signed)
Patient transported to X-ray 

## 2013-04-04 LAB — URINALYSIS, ROUTINE W REFLEX MICROSCOPIC
Glucose, UA: NEGATIVE mg/dL
Hgb urine dipstick: NEGATIVE
Leukocytes, UA: NEGATIVE
pH: 5.5 (ref 5.0–8.0)

## 2013-04-04 MED ORDER — HYDROMORPHONE HCL PF 1 MG/ML IJ SOLN
1.0000 mg | Freq: Once | INTRAMUSCULAR | Status: AC
  Administered 2013-04-04: 1 mg via INTRAVENOUS
  Filled 2013-04-04: qty 1

## 2013-04-04 MED ORDER — OXYCODONE-ACETAMINOPHEN 5-325 MG PO TABS: 2.0000 | ORAL_TABLET | ORAL | Status: DC | PRN

## 2013-04-04 NOTE — ED Provider Notes (Signed)
Medical screening examination/treatment/procedure(s) were performed by non-physician practitioner and as supervising physician I was immediately available for consultation/collaboration.  Alto Gandolfo, MD 04/04/13 0554 

## 2013-04-05 ENCOUNTER — Telehealth: Payer: Self-pay | Admitting: Internal Medicine

## 2013-04-05 MED ORDER — OMEPRAZOLE 40 MG PO CPDR: 40.0000 mg | DELAYED_RELEASE_CAPSULE | Freq: Every day | ORAL | Status: DC

## 2013-04-05 NOTE — Telephone Encounter (Signed)
Called [facility] in [place] and cancelled the rx sent in 04/02/13 for patients  Protonix.  Re-sent it to [facility] in [place].

## 2013-04-08 ENCOUNTER — Emergency Department (HOSPITAL_COMMUNITY)
Admission: EM | Admit: 2013-04-08 | Discharge: 2013-04-08 | Disposition: A | Discharge status: Home / Self Care | Payer: BC Managed Care – HMO | Attending: Emergency Medicine | Admitting: Emergency Medicine

## 2013-04-08 ENCOUNTER — Encounter: Payer: Self-pay | Admitting: Family Medicine

## 2013-04-08 ENCOUNTER — Encounter (HOSPITAL_COMMUNITY): Payer: Self-pay | Admitting: Emergency Medicine

## 2013-04-08 ENCOUNTER — Emergency Department (HOSPITAL_COMMUNITY): Payer: BC Managed Care – HMO

## 2013-04-08 ENCOUNTER — Encounter: Payer: Self-pay | Admitting: Internal Medicine

## 2013-04-08 ENCOUNTER — Ambulatory Visit (INDEPENDENT_AMBULATORY_CARE_PROVIDER_SITE_OTHER): Payer: BC Managed Care – PPO | Admitting: Family Medicine

## 2013-04-08 ENCOUNTER — Telehealth: Payer: Self-pay | Admitting: Internal Medicine

## 2013-04-08 VITALS — BP 140/90 | Temp 98.5°F | Wt 157.0 lb

## 2013-04-08 DIAGNOSIS — Z9884 Bariatric surgery status: Secondary | ICD-10-CM

## 2013-04-08 DIAGNOSIS — F17200 Nicotine dependence, unspecified, uncomplicated: Secondary | ICD-10-CM | POA: Insufficient documentation

## 2013-04-08 DIAGNOSIS — F329 Major depressive disorder, single episode, unspecified: Secondary | ICD-10-CM | POA: Insufficient documentation

## 2013-04-08 DIAGNOSIS — R110 Nausea: Secondary | ICD-10-CM | POA: Insufficient documentation

## 2013-04-08 DIAGNOSIS — I10 Essential (primary) hypertension: Secondary | ICD-10-CM | POA: Insufficient documentation

## 2013-04-08 DIAGNOSIS — Z79899 Other long term (current) drug therapy: Secondary | ICD-10-CM | POA: Insufficient documentation

## 2013-04-08 DIAGNOSIS — R1012 Left upper quadrant pain: Secondary | ICD-10-CM

## 2013-04-08 DIAGNOSIS — Z8719 Personal history of other diseases of the digestive system: Secondary | ICD-10-CM | POA: Insufficient documentation

## 2013-04-08 DIAGNOSIS — Z862 Personal history of diseases of the blood and blood-forming organs and certain disorders involving the immune mechanism: Secondary | ICD-10-CM | POA: Insufficient documentation

## 2013-04-08 DIAGNOSIS — Z87442 Personal history of urinary calculi: Secondary | ICD-10-CM | POA: Insufficient documentation

## 2013-04-08 DIAGNOSIS — R109 Unspecified abdominal pain: Secondary | ICD-10-CM | POA: Insufficient documentation

## 2013-04-08 DIAGNOSIS — F3289 Other specified depressive episodes: Secondary | ICD-10-CM | POA: Insufficient documentation

## 2013-04-08 DIAGNOSIS — K219 Gastro-esophageal reflux disease without esophagitis: Secondary | ICD-10-CM | POA: Insufficient documentation

## 2013-04-08 LAB — CBC WITH DIFFERENTIAL/PLATELET
Basophils Absolute: 0 10*3/uL (ref 0.0–0.1)
Basophils Relative: 1 % (ref 0–1)
Eosinophils Relative: 4 % (ref 0–5)
HCT: 38.5 % — ABNORMAL LOW (ref 39.0–52.0)
Hemoglobin: 13.3 g/dL (ref 13.0–17.0)
Lymphocytes Relative: 39 % (ref 12–46)
MCHC: 34.5 g/dL (ref 30.0–36.0)
MCV: 90.6 fL (ref 78.0–100.0)
Monocytes Absolute: 0.3 10*3/uL (ref 0.1–1.0)
Monocytes Relative: 8 % (ref 3–12)
Neutro Abs: 1.8 10*3/uL (ref 1.7–7.7)
RDW: 13.1 % (ref 11.5–15.5)

## 2013-04-08 LAB — BASIC METABOLIC PANEL
BUN: 6 mg/dL (ref 6–23)
CO2: 28 mEq/L (ref 19–32)
Calcium: 9.3 mg/dL (ref 8.4–10.5)
Chloride: 107 mEq/L (ref 96–112)
Creatinine, Ser: 0.64 mg/dL (ref 0.50–1.35)
GFR calc non Af Amer: >90 mL/min (ref >90)
Potassium: 4.3 mEq/L (ref 3.5–5.1)

## 2013-04-08 MED ORDER — ALUM & MAG HYDROXIDE-SIMETH 200-200-20 MG/5 ML NICU TOPICAL: 1.0000 "application " | Freq: Once | TOPICAL | Status: DC

## 2013-04-08 MED ORDER — PANTOPRAZOLE SODIUM 40 MG IV SOLR: 40.0000 mg | Freq: Once | INTRAVENOUS | Status: DC

## 2013-04-08 MED ORDER — ALUM & MAG HYDROXIDE-SIMETH 200-200-20 MG/5ML PO SUSP
15.0000 mL | Freq: Once | ORAL | Status: AC
  Administered 2013-04-08: 15 mL via ORAL
  Filled 2013-04-08: qty 30

## 2013-04-08 MED ORDER — OXYCODONE-ACETAMINOPHEN 5-325 MG PO TABS: 1.0000 | ORAL_TABLET | ORAL | Status: DC | PRN

## 2013-04-08 MED ORDER — PANTOPRAZOLE SODIUM 20 MG PO TBEC
20.0000 mg | DELAYED_RELEASE_TABLET | Freq: Once | ORAL | Status: AC
  Administered 2013-04-08: 20 mg via ORAL
  Filled 2013-04-08: qty 1

## 2013-04-08 NOTE — ED Notes (Signed)
Patient transported to X-ray 

## 2013-04-08 NOTE — Progress Notes (Signed)
Subjective:    Patient ID: Tony Davila, male    DOB: [birth year], 32 y.o.   MRN: [id number]  HPI  Patient seen regarding some persistent left upper quadrant abdominal pain which has prompted 3 emergency department visits within the past 6 days  History is that he had gastric bypass surgery 2009. He had ruptured jejunal ulcer with repair February 2013. Current pain started about 2 weeks ago and he describes as somewhat similar to previous jejunal ulcer. He describes pain as sharp and without significant radiation. He has not had any vomiting but has had some nausea. No fever. Fairly normal stools. No melena but one "dark" stool earlier today. He takes omeprazole and Carafate regularly. Only rarely takes nonsteroidals  First ED visit was on 04/01/2013. Patient had CT abdomen and pelvis with no acute abnormality. CBC and comprehensive metabolic panel normal. Lipase 66. Patient was given hydrocodone for pain  He returned to ED on 04/03/2013 with persistent left upper to mid quadrant pain. Chest x-ray and two-view abdomen no acute abnormalities. Urinalysis normal. No hematuria. CBC normal.  Patient then returned to ED earlier today. Again, repeat basic metabolic panel and CBC normal. Pain is not really any worse than it had been just no better. He is concern because of prior history of ruptured jejunal ulcer.  Past Medical History  Diagnosis Date  . 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 Depression   . Kidney stone   . Anemia   . GERD (gastroesophageal reflux disease)   . Hypertension     not tx for now  . Hiatal hernia   . Jejunal ulcer 2009    anastomotic   Past Surgical History  Procedure Laterality Date  . Tonsillectomy    . Exploratory laparotomy    . Gastric bypass  2009    Dr. Johna Sheriff  . Upper gastrointestinal endoscopy  02/19/11    normal post-op exam  . Laparoscopy  01/17/2012    Procedure: LAPAROSCOPY DIAGNOSTIC;  Surgeon: Kandis Cocking,  MD;  Location: [facility];  Service: General;  Laterality: N/A;  OVERSEW PERFORATED GASTRIC JEJUNAL ULCER    reports that he has been smoking Cigarettes.  He has been smoking about 0.00 packs per day for the past 12 years. He has never used smokeless tobacco. He reports that he does not drink alcohol or use illicit drugs. family history includes Heart disease in his maternal grandmother; Hyperlipidemia in his maternal grandmother; Hypertension in his maternal grandmother; Irritable bowel syndrome in his brother and father; and Mental illness in his maternal grandmother.  There is no history of Colon cancer. No Known Allergies    Review of Systems  Constitutional: Negative for fever, chills, appetite change and unexpected weight change.  Respiratory: Negative for cough and shortness of breath.   Cardiovascular: Negative for chest pain.  Gastrointestinal: Positive for nausea and abdominal pain. Negative for vomiting, diarrhea, constipation and abdominal distention.  Genitourinary: Negative for dysuria.  Neurological: Negative for dizziness.       Objective:   Physical Exam  Constitutional: He appears well-developed and well-nourished.  Cardiovascular: Normal rate and regular rhythm.   Pulmonary/Chest: Effort normal and breath sounds normal. No respiratory distress. He has no wheezes. He has no rales.  Abdominal: Soft. Bowel sounds are normal. He exhibits no distension and no mass. There is no rebound and no guarding.  Mild tenderness left upper quadrant with no palpated masses. No guarding or rebound  Genitourinary: Rectum normal.  Brown stool with trace heme positive          Assessment & Plan:  Persistent left upper quadrant abdominal pain in a patient with history of gastric bypass surgery and history of jejunal ulcer perforation February 2013 with extensive workup as above.  His exam is benign. He's had 3 separate CBCs over ED visits as above with normal hemoglobin and normal white  blood count. No fever. We'll discuss with GI. He already has appointment there on Monday. He states only thing that has relieved pain is Percocet. We wrote for limited number to use only for severe pain.

## 2013-04-08 NOTE — ED Provider Notes (Signed)
History     CSN: [id number]  Arrival date & time 04/08/13  0865   First MD Initiated Contact with Patient 04/08/13 [phone]      Chief Complaint  Patient presents with  . Abdominal Pain    (Consider location/radiation/quality/duration/timing/severity/associated sxs/prior treatment) HPI  GI: Dr. Leone Payor  This is the patients third visit in the past week.  He has a history of gastric bypass, ruptured jejunal ulcer, GERD, and nephrolithiasis who presents to emergency department with left-sided abdominal pain. Pain began 2 weeks ago and is constant, with sharp exacerbations, and worsening. Pain is associated with nausea. Patient denies diarrhea, hematochezia, melena, dysuria, hematuria, fever, lightheadedness, dizziness. Patient states pain is similar to perforated ulcer. It does not feel like his past kidney stones. Patient takes omeprazole and carafate daily. He has also taken tylenol for pain with minimal relief. He says that this pain is different then his previous two visits because he is having right shoulder pain with it. He knows that he needs an endoscopy and that no abnormalities were found during his previous two visits. He has an appointment scheduled for May 30th with Dr. Leone Payor, which was the sooner they could fit him in.      Past Medical History  Diagnosis Date  . 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 Depression   . Kidney stone   . Anemia   . GERD (gastroesophageal reflux disease)   . Hypertension     not tx for now  . Hiatal hernia   . Jejunal ulcer 2009    anastomotic    Past Surgical History  Procedure Laterality Date  . Tonsillectomy    . Exploratory laparotomy    . Gastric bypass  2009    Dr. Johna Sheriff  . Upper gastrointestinal endoscopy  02/19/11    normal post-op exam  . Laparoscopy  01/17/2012    Procedure: LAPAROSCOPY DIAGNOSTIC;  Surgeon: Kandis Cocking, MD;  Location: [facility];  Service: General;  Laterality: N/A;  OVERSEW  PERFORATED GASTRIC JEJUNAL ULCER    Family History  Problem Relation Age of Onset  . Heart disease Maternal Grandmother   . Hyperlipidemia Maternal Grandmother   . Hypertension Maternal Grandmother   . Mental illness Maternal Grandmother   . Irritable bowel syndrome Father   . Irritable bowel syndrome Brother   . Colon cancer Neg Hx     History  Substance Use Topics  . Smoking status: Current Every Day Smoker -- 12 years    Types: Cigarettes  . Smokeless tobacco: Never Used     Comment: less than 1/2 ppd.  . Alcohol Use: No      Review of Systems  All other systems reviewed and are negative.    Allergies  Review of patient's allergies indicates no known allergies.  Home Medications   Current Outpatient Rx  Name  Route  Sig  Dispense  Refill  . cyanocobalamin 500 MCG tablet   Oral   Take 500 mcg by mouth daily.         . Multiple Vitamins-Minerals (MULTIVITAMIN PO)   Oral   Take 1 tablet by mouth daily.         Marland Kitchen omeprazole (PRILOSEC) 40 MG capsule   Oral   Take 40 mg by mouth daily with breakfast.         . oxyCODONE-acetaminophen (PERCOCET/ROXICET) 5-325 MG per tablet   Oral   Take 1 tablet by mouth every 4 (four) hours  as needed for pain.         Marland Kitchen sucralfate (CARAFATE) 1 G tablet   Oral   Take 1 tablet (1 g total) by mouth 4 (four) times daily -  with meals and at bedtime.   120 tablet   3     BP 126/63  Pulse 62  Temp(Src) 97.6 F (36.4 C) (Oral)  Resp 16  SpO2 100%  Physical Exam  Nursing note and vitals reviewed. Constitutional: He appears well-developed and well-nourished. No distress.  HENT:  Head: Normocephalic and atraumatic.  Eyes: Pupils are equal, round, and reactive to light.  Neck: Normal range of motion. Neck supple.  Cardiovascular: Normal rate and regular rhythm.   Pulmonary/Chest: Effort normal.  Abdominal: Soft. There is no hepatosplenomegaly. There is tenderness in the left upper quadrant and left lower  quadrant. There is guarding (voluntary guarding). There is no CVA tenderness, no tenderness at McBurney's point and negative Murphy's sign.    Neurological: He is alert.  Skin: Skin is warm and dry.    ED Course  Procedures (including critical care time)  Labs Reviewed  CBC WITH DIFFERENTIAL - Abnormal; Notable for the following:    WBC 3.6 (*)    HCT 38.5 (*)    All other components within normal limits  BASIC METABOLIC PANEL   Dg Abd Acute W/chest  04/08/2013   *RADIOLOGY REPORT*  Clinical Data: Left flank pain  ACUTE ABDOMEN SERIES (ABDOMEN 2 VIEW & CHEST 1 VIEW)  Comparison: 04/03/2013  Findings: Chest:  Lungs are clear.  Negative for pneumonia or effusion.  Heart size is normal.  Abdomen:  Moderate stool in the colon.  Negative for bowel obstruction.  No free air.  Surgical clips are present around the gastroesophageal junction. Contrast in the rectosigmoid from CT on 04/02/2013.  IMPRESSION: No active cardiopulmonary abnormality.  Mild constipation without bowel obstruction.   Original Report Authenticated By: Janeece Riggers, M.D.     1. Abdominal  pain, other specified site       MDM  Asians physical exam and x-rays, labs are nonacute. This is the patient's third visit to the ER for his abdominal pain and one week he has had a negative ultrasound, CT scan of the abdomen. I spoke with his GI doctor's physician assistant and asked that he be seen sooner than the 30th of this month. She said they will call him and can see him on Monday. Discussed the plan with the patient and they are happy with this plan.  Pt given protonix and maalox in the  ED.  Pt has been advised of the symptoms that warrant their return to the ED. Patient has voiced understanding and has agreed to follow-up with the PCP or specialist.        Dorthula Matas, PA-C 04/08/13 1230

## 2013-04-08 NOTE — Telephone Encounter (Signed)
Dr. Leone Payor I have spoken with the patient's wife I have scheduled him for 3:15 tomorrow.  I am not sure if Dr. Caryl Never has info for you if you want to call him back.

## 2013-04-08 NOTE — Patient Instructions (Addendum)
Follow up immediately for any fever, recurrent vomiting, or any worsening pain.

## 2013-04-08 NOTE — ED Notes (Signed)
Pt c/o of LLQ quadrants pain and states that he had deferred pain to his right shoulder. States that he is slightly nausea at times but no vomiting. Pt feels like this pain feels the same as when he had a perforated ulcer he had last year in Feb.

## 2013-04-09 ENCOUNTER — Encounter: Payer: Self-pay | Admitting: Internal Medicine

## 2013-04-09 ENCOUNTER — Ambulatory Visit (INDEPENDENT_AMBULATORY_CARE_PROVIDER_SITE_OTHER): Payer: BC Managed Care – HMO | Admitting: Internal Medicine

## 2013-04-09 ENCOUNTER — Ambulatory Visit: Payer: Self-pay | Admitting: Family Medicine

## 2013-04-09 VITALS — BP 100/68 | HR 64 | Ht 66.0 in | Wt 158.8 lb

## 2013-04-09 DIAGNOSIS — R1012 Left upper quadrant pain: Secondary | ICD-10-CM

## 2013-04-09 DIAGNOSIS — Z9884 Bariatric surgery status: Secondary | ICD-10-CM

## 2013-04-09 DIAGNOSIS — Z87898 Personal history of other specified conditions: Secondary | ICD-10-CM

## 2013-04-09 NOTE — ED Provider Notes (Signed)
Medical screening examination/treatment/procedure(s) were performed by non-physician practitioner and as supervising physician I was immediately available for consultation/collaboration.  Tynika Luddy T Altin Sease, MD 04/09/13 0705 

## 2013-04-09 NOTE — Progress Notes (Signed)
  Subjective:    Patient ID: Tony Davila, male    DOB: 18-Jan-1981, 32 y.o.   MRN: [id number]  HPI This man is here with a 2+ week hx of LUQ pain. Fairly constant and steady with increases in sxs that remind him of prior problems with a marginal ulcer that eventually perforated. No obvious melena but he did have some dark stools. Not now. Labs have been unrevealing, including CBC and lipase. He has had CT and standard Xrays without abnormalities to explain the pain. He has required narcotics to relieve the pain. Medications, allergies, past medical history, past surgical history, family history and social history are reviewed and updated in the EMR.  Review of Systems As above    Objective:   Physical Exam General:  NAD Eyes:   anicteric Lungs:  clear Heart:  S1S2 no rubs, murmurs or gallops Abdomen:  Soft,  Mld-mod tender LUQ, BS+ and increased   Data Reviewed:  CT 04/01/2013 ED visits x 2 PCP visit labs     Assessment & Plan:  LUQ pain with hx of prior perforated ulcer in setting of prior bariatric surgery  1. Hard to know what is going on - so far nothing worrisome but he has a hx of an ulcer that perforated and says this reminds him of the period before that. ? Recurrent ulcer, ? Internal hernia (can be seen on CT). 2. Will go ahead with an EGD on Monday 5/19 - if negative will need surgical evaluation and may anyway.  WU:[id number],GNFAO W, MD Ovidio Kin, MD

## 2013-04-09 NOTE — Patient Instructions (Addendum)
You have been scheduled for an endoscopy with propofol. Please follow written instructions given to you at your visit today. If you use inhalers (even only as needed), please bring them with you on the day of your procedure. Your physician has requested that you go to www.startemmi.com and enter the access code given to you at your visit today. This web site gives a general overview about your procedure. However, you should still follow specific instructions given to you by our office regarding your preparation for the procedure.   Thank you for choosing me and [facility].  Carl E. Gessner, M.D., FACG  

## 2013-04-12 ENCOUNTER — Encounter: Payer: Self-pay | Admitting: Internal Medicine

## 2013-04-12 ENCOUNTER — Ambulatory Visit: Payer: Self-pay | Admitting: Nurse Practitioner

## 2013-04-12 ENCOUNTER — Ambulatory Visit (AMBULATORY_SURGERY_CENTER): Payer: BC Managed Care – HMO | Admitting: Internal Medicine

## 2013-04-12 VITALS — BP 124/75 | HR 59 | Temp 97.2°F | Resp 16 | Ht 66.0 in | Wt 158.0 lb

## 2013-04-12 DIAGNOSIS — R1012 Left upper quadrant pain: Secondary | ICD-10-CM

## 2013-04-12 DIAGNOSIS — K289 Gastrojejunal ulcer, unspecified as acute or chronic, without hemorrhage or perforation: Secondary | ICD-10-CM

## 2013-04-12 HISTORY — PX: ESOPHAGOGASTRODUODENOSCOPY: SHX1529

## 2013-04-12 MED ORDER — MISOPROSTOL 200 MCG PO TABS: 200.0000 ug | ORAL_TABLET | Freq: Four times a day (QID) | ORAL | Status: DC

## 2013-04-12 MED ORDER — OMEPRAZOLE 40 MG PO CPDR: 40.0000 mg | DELAYED_RELEASE_CAPSULE | Freq: Two times a day (BID) | ORAL | Status: DC

## 2013-04-12 MED ORDER — OXYCODONE-ACETAMINOPHEN 5-325 MG PO TABS: 1.0000 | ORAL_TABLET | ORAL | Status: DC | PRN

## 2013-04-12 MED ORDER — SODIUM CHLORIDE 0.9 % IV SOLN: 500.0000 mL | INTRAVENOUS | Status: DC

## 2013-04-12 NOTE — Patient Instructions (Addendum)
There was an ulcer where the stomach and intestine are joined together. I have changed the omeprazole to 40 mg twice a day and prescribed misoprostol which may help heal this also. You will need to see the surgeons again also and we will get you an appointment. I will let you know biopsy results and further plans.  I appreciate the opportunity to care for you.  Iva Boop, MD, FACG  YOU HAD AN ENDOSCOPIC PROCEDURE TODAY AT [facility]: Refer to the procedure report that was given to you for any specific questions about what was found during the examination.  If the procedure report does not answer your questions, please call your gastroenterologist to clarify.  If you requested that your care partner not be given the details of your procedure findings, then the procedure report has been included in a sealed envelope for you to review at your convenience later.  YOU SHOULD EXPECT: Some feelings of bloating in the abdomen. Passage of more gas than usual.  Walking can help get rid of the air that was put into your GI tract during the procedure and reduce the bloating. If you had a lower endoscopy (such as a colonoscopy or flexible sigmoidoscopy) you may notice spotting of blood in your stool or on the toilet paper. If you underwent a bowel prep for your procedure, then you may not have a normal bowel movement for a few days.  DIET: Your first meal following the procedure should be a light meal and then it is ok to progress to your normal diet.  A half-sandwich or bowl of soup is an example of a good first meal.  Heavy or fried foods are harder to digest and may make you feel nauseous or bloated.  Likewise meals heavy in dairy and vegetables can cause extra gas to form and this can also increase the bloating.  Drink plenty of fluids but you should avoid alcoholic beverages for 24 hours.  ACTIVITY: Your care partner should take you home directly after the procedure.  You should plan to  take it easy, moving slowly for the rest of the day.  You can resume normal activity the day after the procedure however you should NOT DRIVE or use heavy machinery for 24 hours (because of the sedation medicines used during the test).    SYMPTOMS TO REPORT IMMEDIATELY: A gastroenterologist can be reached at any hour.  During normal business hours, 8:30 AM to 5:00 PM Monday through Friday, call [phone].  After hours and on weekends, please call the GI answering service at [phone] who will take a message and have the physician on call contact you.   Following upper endoscopy (EGD)  Vomiting of blood or coffee ground material  New chest pain or pain under the shoulder blades  Painful or persistently difficult swallowing  New shortness of breath  Fever of 100F or higher  Black, tarry-looking stools  FOLLOW UP: If any biopsies were taken you will be contacted by phone or by letter within the next 1-3 weeks.  Call your gastroenterologist if you have not heard about the biopsies in 3 weeks.  Our staff will call the home number listed on your records the next business day following your procedure to check on you and address any questions or concerns that you may have at that time regarding the information given to you following your procedure. This is a courtesy call and so if there is no answer at the  home number and we have not heard from you through the emergency physician on call, we will assume that you have returned to your regular daily activities without incident.  SIGNATURES/CONFIDENTIALITY: You and/or your care partner have signed paperwork which will be entered into your electronic medical record.  These signatures attest to the fact that that the information above on your After Visit Summary has been reviewed and is understood.  Full responsibility of the confidentiality of this discharge information lies with you and/or your care-partner.  Increase omeprazole 40 mg bid plus  add misoprostol 200 mcg four times a day  Surgery appointment this month or early June (Dr. Ezzard Standing)  Anticipate repeating EGD in July or so-wait pathology to decide

## 2013-04-12 NOTE — Progress Notes (Signed)
NO EGG OR SOY ALLERGY NOTED PER PT. EWM

## 2013-04-12 NOTE — Progress Notes (Signed)
Patient did not experience any of the following events: a burn prior to discharge; a fall within the facility; wrong site/side/patient/procedure/implant event; or a hospital transfer or hospital admission upon discharge from the facility. (G8907) Patient did not have preoperative order for IV antibiotic SSI prophylaxis. (G8918)  

## 2013-04-12 NOTE — Op Note (Addendum)
[facility] [address] [place], [address]   ENDOSCOPY PROCEDURE REPORT  PATIENT: Tony Davila, Tony Davila  MR#: [id number] BIRTHDATE: [birth year] , 32  yrs. old GENDER: Male ENDOSCOPIST: Iva Boop, MD, [facility] PROCEDURE DATE:  04/12/2013 PROCEDURE:  EGD w/ biopsy ASA CLASS:     Class II INDICATIONS:  abdominal pain in upper left quadrant. MEDICATIONS: propofol (Diprivan) 300mg  IV, MAC sedation, administered by CRNA, and These medications were titrated to patient response per physician's verbal order TOPICAL ANESTHETIC: Cetacaine Spray  DESCRIPTION OF PROCEDURE: After the risks benefits and alternatives of the procedure were thoroughly explained, informed consent was obtained.  The LB [id number] endoscope was introduced through the mouth and advanced to the proximal jejunum. Without limitations.  The instrument was slowly withdrawn as the mucosa was fully examined.        STOMACH: A single round and clean-based ulcer measuring 8 x 8mm in size was found at the anastomosis. On jejunal side. Biopsies taken and sent to pathology.  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   GE junction at 35 cm and gastrojejunal anastomosis at 46 cm.  The remainder of the upper endoscopy exam was otherwise normal. Retroflexed views revealed no abnormalities.     The scope was then withdrawn from the patient and the procedure completed.  COMPLICATIONS: There were no complications. ENDOSCOPIC IMPRESSION: 1.   Single ulcer measuring 8 x 8mm in size was found at the anastomosis - recurrent gastrojejunal ulcer, s/p oversew in 12/2011 2.   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   The remainder of the upper endoscopy exam was otherwise normal  RECOMMENDATIONS: Increase omeprazole to 40 mg bid, add misoprostol 200 mcg qid, refill ocycodone-acetaminophen 325 mg Surgery appointment  this month or early June (Dr.  Ezzard Standing) Anticipate repeating EGD in July  or so - await pathology to decide   eSigned:  Iva Boop, MD, [facility] 04/12/2013 8:38 AM Revised: 04/12/2013 8:38 AM  HY:QMVHQ Ezzard Standing, MD and The Patient

## 2013-04-13 ENCOUNTER — Telehealth: Payer: Self-pay | Admitting: *Deleted

## 2013-04-13 NOTE — Telephone Encounter (Signed)
Number identifier, left message, follow-up  

## 2013-04-14 ENCOUNTER — Telehealth: Payer: Self-pay

## 2013-04-14 NOTE — Telephone Encounter (Signed)
I have left a message for the patient to call and confirm he got my message to see Dr. Johna Sheriff at [facility] 04/20/13 4:30.

## 2013-04-15 NOTE — Telephone Encounter (Signed)
I spoke with [name] the patient's wife, he has the message, but not sure if he can make the appt.,.  I have asked them to call and reschedule with [facility].  She said she will try and reschedule this with them.

## 2013-04-15 NOTE — Telephone Encounter (Signed)
appt is actually for 04/16/13 4:45.  I have left a message for the patient to call back to confirm he got the appt information

## 2013-04-16 ENCOUNTER — Ambulatory Visit (INDEPENDENT_AMBULATORY_CARE_PROVIDER_SITE_OTHER): Payer: BC Managed Care – PPO | Admitting: General Surgery

## 2013-04-16 ENCOUNTER — Ambulatory Visit (INDEPENDENT_AMBULATORY_CARE_PROVIDER_SITE_OTHER): Payer: BC Managed Care – PPO | Admitting: Surgery

## 2013-04-23 ENCOUNTER — Ambulatory Visit: Payer: Self-pay | Admitting: Internal Medicine

## 2013-04-23 ENCOUNTER — Telehealth: Payer: Self-pay | Admitting: Internal Medicine

## 2013-04-23 MED ORDER — OXYCODONE-ACETAMINOPHEN 5-325 MG PO TABS: 1.0000 | ORAL_TABLET | ORAL | Status: DC | PRN

## 2013-04-23 NOTE — Telephone Encounter (Signed)
I cannot see that the meds were filled on narcotic Rx search Please check with pharmacy to see if filled

## 2013-04-23 NOTE — Telephone Encounter (Signed)
Informed wife Tony Davila that we will put the oxy rx up front for pick up and that he will need a visit before any more refills per Dr. Leone Payor.

## 2013-04-23 NOTE — Telephone Encounter (Signed)
Please advise Sir, thank you. 

## 2013-04-30 ENCOUNTER — Encounter: Payer: Self-pay | Admitting: Internal Medicine

## 2013-04-30 ENCOUNTER — Ambulatory Visit (INDEPENDENT_AMBULATORY_CARE_PROVIDER_SITE_OTHER): Payer: BC Managed Care – HMO | Admitting: Internal Medicine

## 2013-04-30 VITALS — BP 120/72 | HR 68 | Temp 97.9°F | Ht 66.0 in | Wt 153.0 lb

## 2013-04-30 DIAGNOSIS — T50905A Adverse effect of unspecified drugs, medicaments and biological substances, initial encounter: Secondary | ICD-10-CM

## 2013-04-30 DIAGNOSIS — K269 Duodenal ulcer, unspecified as acute or chronic, without hemorrhage or perforation: Secondary | ICD-10-CM

## 2013-04-30 DIAGNOSIS — R51 Headache: Secondary | ICD-10-CM

## 2013-04-30 DIAGNOSIS — T887XXA Unspecified adverse effect of drug or medicament, initial encounter: Secondary | ICD-10-CM

## 2013-04-30 NOTE — Patient Instructions (Signed)
Ulcers of the Gastrointestinal Tract You have an ulcer or are likely to get ulcers more often than most people. An ulcer is a break or hole in the lining of the esophagus (food tube from the mouth to the stomach), stomach, or the first part of the small bowel. CAUSES   Germs (bacteria). There is a bacterium related to ulcers called Helicobacter Pylori.  Medications such as the nonsteroidal anti-inflammatory medications.  Cigarette smoking is related to ulcers and it does not help them heal. SYMPTOMS   Burning or gnawing of the mid upper belly (abdomen). This is usually relieved with food or antacids.  Feeling sick to your stomach (nausea).  Bloating.  Vomiting.  If the ulcer results in bleeding, it can cause:  Black tarry stools.  Vomiting of bright red blood.  With severe bleeding, there may be:  Loss of consciousness and shock.  Vomiting coffee ground looking materials. DIAGNOSIS  Learning what is wrong (diagnosis) is usually made with x-rays (barium studies) and upper GI (gastrointestinal) endoscopy. With endoscopy, a flexible tube is used to look at the esophagus, stomach, and small bowel. Abnormal areas may be biopsied. This is when a small piece of tissue is removed to look at under a microscope. In young people, it is safe to treat without studies (barium x-rays, for example) and to use diagnostic studies on those who do not respond. TREATMENT   Avoid tobacco, alcohol, and foods that seem to make your pain worse. Tobacco use will slow the healing process.  Take other medications as directed. Your caregiver may prescribe medications known as H2 blockers that cut down on the production of acid. Other medications are available that protect the lining of the bowel.  Continue regular work and usual activities unless told otherwise by your caregiver.  If you failed to respond to the usual ulcer treatments, ask your caregiver if antibiotics are a consideration. These are  medications that kill germs. SEEK IMMEDIATE MEDICAL CARE IF:  You develop:  Bright red bleeding.  Vomit blood.  Become light-headed, weak.  Have fainting episodes.  Become sweaty, cold and clammy.  You have severe abdominal pain not controlled by medications. Do not take pain medications unless told by your caregiver. Document Released: 04/03/2001 Document Revised: 02/03/2012 Document Reviewed: 08/21/2005 [facility] Patient Information 2013 [place], [place].

## 2013-04-30 NOTE — Progress Notes (Signed)
Subjective:    Patient ID: Tony Davila, male    DOB: [birth year], 32 y.o.   MRN: [id number]  HPI  Pt  Presents to the clinic today with c/o stomach pain. He recently underwent and EGD with showed a recurrent ulcer at the anastomosis of the gastrojejunal junction. He is on Prilosec, misoprostol and carafate. He is having headaches secondary to the cytotec and is wondering if it can be changed to something else. He is in constant pain from the ulcer. More than anything, he would like to proceed with the surgery with [facility], but they can not see him until July.  Review of Systems      Past Medical History  Diagnosis Date  . 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 Depression   . Kidney stone   . Anemia   . GERD (gastroesophageal reflux disease)   . Hypertension     not tx for now  . Hiatal hernia   . Jejunal ulcer 2009    anastomotic    Current Outpatient Prescriptions  Medication Sig Dispense Refill  . cyanocobalamin 500 MCG tablet Take 500 mcg by mouth daily.      . misoprostol (CYTOTEC) 200 MCG tablet Take 1 tablet (200 mcg total) by mouth 4 (four) times daily.  120 tablet  1  . Multiple Vitamins-Minerals (MULTIVITAMIN PO) Take 1 tablet by mouth daily.      Marland Kitchen omeprazole (PRILOSEC) 40 MG capsule Take 1 capsule (40 mg total) by mouth 2 (two) times daily before a meal. Before breakfast and supper  60 capsule  5  . oxyCODONE-acetaminophen (PERCOCET/ROXICET) 5-325 MG per tablet Take 1 tablet by mouth every 4 (four) hours as needed for pain.  30 tablet  0  . sucralfate (CARAFATE) 1 G tablet Take 1 tablet (1 g total) by mouth 4 (four) times daily -  with meals and at bedtime.  120 tablet  3   No current facility-administered medications for this visit.    No Known Allergies  Family History  Problem Relation Age of Onset  . Heart disease Maternal Grandmother   . Hyperlipidemia Maternal Grandmother   . Hypertension Maternal Grandmother   . Mental illness  Maternal Grandmother   . Irritable bowel syndrome Father   . Irritable bowel syndrome Brother   . Colon cancer Neg Hx   . Stomach cancer Neg Hx     History   Social History  . Marital Status: Married    Spouse Name: N/A    Number of Children: 0  . Years of Education: N/A   Occupational History  .     Social History Main Topics  . Smoking status: Current Every Day Smoker -- 1.00 packs/day for 12 years    Types: Cigarettes  . Smokeless tobacco: Never Used     Comment: less than 1/2 ppd.  . Alcohol Use: No  . Drug Use: No  . Sexually Active: Yes   Other Topics Concern  . Not on file   Social History Narrative  . No narrative on file     Constitutional: Pt reports headache. Denies fever, malaise, fatigue, or abrupt weight changes.  Respiratory: Denies difficulty breathing, shortness of breath, cough or sputum production.   Cardiovascular: Denies chest pain, chest tightness, palpitations or swelling in the hands or feet.  Gastrointestinal: Pt reports stomach pain. Denies bloating, constipation, diarrhea or blood in the stool.    No other specific complaints in a complete review  of systems (except as listed in HPI above).  Objective:   Physical Exam   BP 120/72  Pulse 68  Temp(Src) 97.9 F (36.6 C) (Oral)  Ht 5\' 6"  (1.676 m)  Wt 153 lb (69.4 kg)  BMI 24.71 kg/m2  SpO2 97% Wt Readings from Last 3 Encounters:  04/30/13 153 lb (69.4 kg)  04/12/13 158 lb (71.668 kg)  04/09/13 158 lb 12.8 oz (72.031 kg)    General: Appears his stated age, well developed, well nourished in NAD. HEENT: Head: normal shape and size; Eyes: sclera white, no icterus, conjunctiva pink, PERRLA and EOMs intact; Ears: Tm's gray and intact, normal light reflex; Nose: mucosa pink and moist, septum midline; Throat/Mouth: Teeth present, mucosa pink and moist, no exudate, lesions or ulcerations noted.  Neck: Normal range of motion. Neck supple, trachea midline. No massses, lumps or thyromegaly  present.  Cardiovascular: Normal rate and rhythm. S1,S2 noted.  No murmur, rubs or gallops noted. No JVD or BLE edema. No carotid bruits noted. Pulmonary/Chest: Normal effort and positive vesicular breath sounds. No respiratory distress. No wheezes, rales or ronchi noted.  Abdomen: Soft and tender in the epigastric region. Normal bowel sounds, no bruits noted. No distention or masses noted. Liver, spleen and kidneys non palpable.   BMET    Component Value Date/Time   NA 142 04/08/2013 1100   K 4.3 04/08/2013 1100   CL 107 04/08/2013 1100   CO2 28 04/08/2013 1100   GLUCOSE 89 04/08/2013 1100   BUN 6 04/08/2013 1100   CREATININE 0.64 04/08/2013 1100   CALCIUM 9.3 04/08/2013 1100   GFRNONAA >90 04/08/2013 1100   GFRAA >90 04/08/2013 1100    Lipid Panel     Component Value Date/Time   CHOL 133 06/06/2011 0958   TRIG 58 06/06/2011 0958   HDL 50 06/06/2011 0958   CHOLHDL 2.7 06/06/2011 0958   VLDL 12 06/06/2011 0958   LDLCALC 71 06/06/2011 0958    CBC    Component Value Date/Time   WBC 3.6* 04/08/2013 1100   RBC 4.25 04/08/2013 1100   HGB 13.3 04/08/2013 1100   HCT 38.5* 04/08/2013 1100   PLT 170 04/08/2013 1100   MCV 90.6 04/08/2013 1100   MCH 31.3 04/08/2013 1100   MCHC 34.5 04/08/2013 1100   RDW 13.1 04/08/2013 1100   LYMPHSABS 1.4 04/08/2013 1100   MONOABS 0.3 04/08/2013 1100   EOSABS 0.1 04/08/2013 1100   BASOSABS 0.0 04/08/2013 1100    Hgb A1C No results found for this basename: HGBA1C        Assessment & Plan:   Headache secondary to medication side effect and stomach ulcer:  Call GI And see if you can be seen sooner for f/u

## 2013-05-04 ENCOUNTER — Ambulatory Visit (INDEPENDENT_AMBULATORY_CARE_PROVIDER_SITE_OTHER): Payer: BC Managed Care – HMO | Admitting: Family Medicine

## 2013-05-04 ENCOUNTER — Encounter: Payer: Self-pay | Admitting: Family Medicine

## 2013-05-04 ENCOUNTER — Other Ambulatory Visit: Payer: Self-pay | Admitting: Family Medicine

## 2013-05-04 ENCOUNTER — Ambulatory Visit (INDEPENDENT_AMBULATORY_CARE_PROVIDER_SITE_OTHER)
Admission: RE | Admit: 2013-05-04 | Discharge: 2013-05-04 | Disposition: A | Discharge status: Home / Self Care | Payer: BC Managed Care – HMO | Source: Ambulatory Visit | Attending: Family Medicine | Admitting: Family Medicine

## 2013-05-04 VITALS — BP 122/72 | Temp 98.4°F | Wt 152.0 lb

## 2013-05-04 DIAGNOSIS — M546 Pain in thoracic spine: Secondary | ICD-10-CM

## 2013-05-04 NOTE — Progress Notes (Signed)
  Subjective:    Patient ID: Tony Davila, male    DOB: [birth year], 32 y.o.   MRN: [id number]  HPI Patient seen with mid thoracic back pain. Duration approximately 2 months. Denies injury. Pain is moderate and somewhat intermittent. Somewhat worse with movement. He has not taken any anti-inflammatories or medications because of recent recurrent peptic ulcer. Denies any fever, chills, appetite or weight changes No alleviating factors  Patient has history of peptic ulcer. Recent endoscopy revealed recurrent ulcer anastomosis site at GE junction. He had previous gastric bypass surgery 2009. Patient currently taking Prilosec, Carafate, and Cytotec. No recent stool changes.  Past Medical History  Diagnosis Date  . 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 Depression   . Kidney stone   . Anemia   . GERD (gastroesophageal reflux disease)   . Hypertension     not tx for now  . Hiatal hernia   . Jejunal ulcer 2009    anastomotic   Past Surgical History  Procedure Laterality Date  . Tonsillectomy    . Exploratory laparotomy    . Gastric bypass  2009    Dr. Johna Sheriff  . Upper gastrointestinal endoscopy  02/19/11    normal post-op exam  . Laparoscopy  01/17/2012    Procedure: LAPAROSCOPY DIAGNOSTIC;  Surgeon: Kandis Cocking, MD;  Location: [facility];  Service: General;  Laterality: N/A;  OVERSEW PERFORATED GASTRIC JEJUNAL ULCER    reports that he has been smoking Cigarettes.  He has a 12 pack-year smoking history. He has never used smokeless tobacco. He reports that he does not drink alcohol or use illicit drugs. family history includes Heart disease in his maternal grandmother; Hyperlipidemia in his maternal grandmother; Hypertension in his maternal grandmother; Irritable bowel syndrome in his brother and father; and Mental illness in his maternal grandmother.  There is no history of Colon cancer and Stomach cancer. No Known Allergies    Review of Systems   Constitutional: Negative for fever, chills, appetite change and unexpected weight change.  Respiratory: Negative for cough and shortness of breath.   Cardiovascular: Negative for chest pain.  Gastrointestinal: Negative for nausea, vomiting and blood in stool.       Objective:   Physical Exam  Constitutional: He appears well-developed and well-nourished.  Cardiovascular: Normal rate and regular rhythm.   Pulmonary/Chest: Effort normal and breath sounds normal. No respiratory distress. He has no wheezes. He has no rales.  Musculoskeletal:  Patient has tenderness to palpation along the thoracic spine around T7. No overlying erythema or warmth. He has mild scoliosis by exam.          Assessment & Plan:  Mid thoracic back pain. Given unusual location and duration 2 months obtain thoracic spine films. If normal, consider physical therapy. Avoid nonsteroidals with recurrent peptic ulcer disease history

## 2013-05-05 ENCOUNTER — Other Ambulatory Visit: Payer: Self-pay | Admitting: *Deleted

## 2013-05-05 DIAGNOSIS — M549 Dorsalgia, unspecified: Secondary | ICD-10-CM

## 2013-05-05 NOTE — Progress Notes (Signed)
Quick Note:  Pt informed and yes he would like TP for ongoing back pain. I will order, FYI only ______

## 2013-05-10 ENCOUNTER — Ambulatory Visit: Payer: Self-pay | Admitting: Internal Medicine

## 2013-05-10 ENCOUNTER — Ambulatory Visit: Payer: BC Managed Care – PPO | Attending: Family Medicine | Admitting: Physical Therapy

## 2013-05-10 DIAGNOSIS — IMO0001 Reserved for inherently not codable concepts without codable children: Secondary | ICD-10-CM | POA: Insufficient documentation

## 2013-05-10 DIAGNOSIS — M546 Pain in thoracic spine: Secondary | ICD-10-CM | POA: Insufficient documentation

## 2013-05-10 DIAGNOSIS — M2560 Stiffness of unspecified joint, not elsewhere classified: Secondary | ICD-10-CM | POA: Insufficient documentation

## 2013-05-10 DIAGNOSIS — R5381 Other malaise: Secondary | ICD-10-CM | POA: Insufficient documentation

## 2013-05-11 ENCOUNTER — Encounter: Payer: Self-pay | Admitting: Internal Medicine

## 2013-05-11 ENCOUNTER — Encounter: Payer: Self-pay | Admitting: Physician Assistant

## 2013-05-11 ENCOUNTER — Ambulatory Visit (INDEPENDENT_AMBULATORY_CARE_PROVIDER_SITE_OTHER): Payer: BC Managed Care – PPO | Admitting: Physician Assistant

## 2013-05-11 ENCOUNTER — Ambulatory Visit: Payer: BC Managed Care – PPO | Admitting: Physical Therapy

## 2013-05-11 VITALS — BP 124/60 | HR 76 | Ht 66.0 in | Wt 149.2 lb

## 2013-05-11 DIAGNOSIS — K287 Chronic gastrojejunal ulcer without hemorrhage or perforation: Secondary | ICD-10-CM | POA: Insufficient documentation

## 2013-05-11 DIAGNOSIS — K283 Acute gastrojejunal ulcer without hemorrhage or perforation: Secondary | ICD-10-CM

## 2013-05-11 DIAGNOSIS — R1012 Left upper quadrant pain: Secondary | ICD-10-CM

## 2013-05-11 HISTORY — DX: Chronic gastrojejunal ulcer without hemorrhage or perforation: K28.7

## 2013-05-11 MED ORDER — ALPRAZOLAM 0.25 MG PO TABS: 0.2500 mg | ORAL_TABLET | Freq: Every evening | ORAL | Status: DC | PRN

## 2013-05-11 MED ORDER — SUCRALFATE 1 GM/10ML PO SUSP: 1.0000 g | Freq: Three times a day (TID) | ORAL | Status: DC

## 2013-05-11 NOTE — Progress Notes (Signed)
Subjective:    Patient ID: Tony Davila, male    DOB: 1981/06/07, 32 y.o.   MRN: [id number]  HPI  Tony Davila  is a 32 year old white male known to Dr. Leone Payor , who is status post Roux-en-Y gastric bypass in 2009. He lost from a total of 376 pounds  To current of 149 pounds the Unfortunately his postop course has been complicated by recurrent ulcer disease. He states that he had an ulcer about 6 months after his surgery, then had a perforated gastrojejunal ulcer in February 2013 for which he underwent laparoscopic oversew per Dr. Ezzard Standing. He had presented to Dr. Leone Payor in May of 2014 with complaints of 2-3 weeks of left upper quadrant pain which was reminiscent of his prior ulcer symptoms. He underwent upper endoscopy on 04/12/2013 and was found to have an 8 x 8 mm anastomotic ulcer on the jejunal side. Biopsies were taken and this shows chronically inflamed and focally ulcerated small bowel mucosa. He was placed on a combination of Prilosec 40 mg twice daily, Carafate 1 g 4 times daily and Cytotec 200 mcg 4 times daily. He had also been given hydrocodone to use as needed for pain. Comes in today having missed a followup appointment with Dr. Leone Payor yesterday due to error in  Scheduling. He states that he is still having pain which has been persistent but not quite as bad as it had been. He has run out of hydrocodone but says she's been taking all of his other medications as prescribed. He says the Cytotec has been giving him headaches on a daily basis. He is also been under a lot of stress as his grandmother is currently in hospice and he and his wife are taking care of her. He wonders if stresses exacerbating his pain. He is having occasional episodes of vomiting, no hematemesis and says that he will tolerate food one day and the next day may have problems with pain bloating et Karie Soda. He has a followup surgical appointment scheduled but that is not until July 17    Review of Systems  Constitutional:  Positive for unexpected weight change.  HENT: Negative.   Eyes: Negative.   Respiratory: Negative.   Cardiovascular: Negative.   Gastrointestinal: Positive for abdominal pain.  Endocrine: Negative.   Genitourinary: Negative.   Musculoskeletal: Positive for back pain.  Skin: Negative.   Allergic/Immunologic: Negative.   Neurological: Negative.   Hematological: Negative.   Psychiatric/Behavioral: The patient is nervous/anxious.    Outpatient Prescriptions Prior to Visit  Medication Sig Dispense Refill  . cyanocobalamin 500 MCG tablet Take 500 mcg by mouth daily.      . misoprostol (CYTOTEC) 200 MCG tablet Take 1 tablet (200 mcg total) by mouth 4 (four) times daily.  120 tablet  1  . Multiple Vitamins-Minerals (MULTIVITAMIN PO) Take 1 tablet by mouth daily.      Marland Kitchen omeprazole (PRILOSEC) 40 MG capsule Take 1 capsule (40 mg total) by mouth 2 (two) times daily before a meal. Before breakfast and supper  60 capsule  5  . sucralfate (CARAFATE) 1 G tablet Take 1 tablet (1 g total) by mouth 4 (four) times daily -  with meals and at bedtime.  120 tablet  3  . oxyCODONE-acetaminophen (PERCOCET/ROXICET) 5-325 MG per tablet Take 1 tablet by mouth every 4 (four) hours as needed for pain.  30 tablet  0   No facility-administered medications prior to visit.   No Known Allergies Patient Active Problem List   Diagnosis Date  Noted  . Acute gastrojejunal anastomotic ulcer 05/11/2013  . Varicose veins of leg with pain 02/04/2013  . Pneumonia 08/07/2012  . History of gastric bypass, 01/04/2008. 01/29/2012  . Marginal ulcer, perforation at gastrojejunostomy 01/17/2012. 01/29/2012  . Grief 01/05/2012  . Asthma exacerbation 12/23/2011  . Soreness of tongue 11/17/2011  . ED (erectile dysfunction) 11/17/2011  . LLQ abdominal pain 04/30/2011  . Headache 04/30/2011  . PERSONAL HISTORY OF PEPTIC ULCER DISEASE 02/07/2011  . Hypertension 01/07/2011  . Tobacco use disorder 01/07/2011  . Nephrolithiasis  01/07/2011  . Iron deficiency 01/07/2011  . Depression 01/07/2011  . MORBID OBESITY 02/19/2008  . HYPERTENSION 02/19/2008  . RENAL CALCULUS 02/19/2008  . GASTROESOPHAGEAL REFLUX DISEASE 05/26/2007   History  Substance Use Topics  . Smoking status: Current Every Day Smoker -- 1.00 packs/day for 12 years    Types: Cigarettes  . Smokeless tobacco: Never Used     Comment: less than 1/2 ppd.  . Alcohol Use: No   family history includes Cystic fibrosis in his mother; Diabetes in his maternal grandmother; Heart disease in his maternal grandmother; Hyperlipidemia in his maternal grandmother; Hypertension in his maternal grandmother; Irritable bowel syndrome in his brother and father; and Mental illness in his maternal grandmother.  There is no history of Colon cancer and Stomach cancer.     Objective:   Physical Exam well-developed young white male in no acute distress, blood pressure 124/60 pulse 76 height 5 foot 6 weight 149. HEENT; nontraumatic normocephalic EOMI PERRLA sclera anicteric,Neck; Supple no JVD, Cardiovascular; regular rate and rhythm with S1-S2 no murmur or gallop, Pulmonary; clear bilaterally, Abdomen; soft he is tender in the epigastrium and left upper quadrant there is no guarding or rebound no palpable mass or hepatosplenomegaly bowel sounds are active he has incisional scars and excessive loose skin folds , Rectal; exam not done, Extremities; no clubbing cyanosis or edema skin warm and dry, Psych; mood and affect normal and appropriate.        Assessment & Plan:  #64  32 year old white male status post Roux-en-Y gastric bypass for obesity in 2009. Patient has had 3 separate occurrences of anastomotic ulcers since that time and had a perforated gastrojejunal ulcer in February 2013 . Now with documented anastomotic ulcer May 2014, and persistent complaints of epigastric and left upper quadrant pain. pain may still be due to anastomotic ulcer- question nonhealing, also suspect  underlying anxiety exacerbating  his symptoms.  Plan; stop Cytotec due  to headaches Continue Carafate but switched to Carafate liquid 1 g 4 times daily between meals and at bedtime at patient request. Continue Prilosec 40 mg by mouth twice daily Trial of Xanax 0.25 once or twice daily as needed for anxiety #30 and one refill Schedule followup endoscopy mid July to  document healing- Patient is to keep his surgical appointment with Dr. Ezzard Standing on July 17

## 2013-05-11 NOTE — Progress Notes (Signed)
Agree with Ms. Esterwood's assessment and plan. Riona Lahti E. Koltan Portocarrero, MD, FACG   

## 2013-05-11 NOTE — Patient Instructions (Addendum)
You have been given a separate informational sheet regarding your tobacco use, the importance of quitting and local resources to help you quit. You have been scheduled for an endoscopy with propofol. Please follow written instructions given to you at your visit today. If you use inhalers (even only as needed), please bring them with you on the day of your procedure. Your physician has requested that you go to www.startemmi.com and enter the access code given to you at your visit today. This web site gives a general overview about your procedure. However, you should still follow specific instructions given to you by our office regarding your preparation for the procedure. We sent a prescription to [facility] [address] for the [place] Suspension. We will fax the xanax prescription.

## 2013-05-12 ENCOUNTER — Inpatient Hospital Stay (HOSPITAL_COMMUNITY)
Admission: EM | Admit: 2013-05-12 | Disposition: A | Discharge status: Home / Self Care | Payer: BC Managed Care – PPO | Source: Home / Self Care | Admitting: Psychiatry

## 2013-05-12 ENCOUNTER — Emergency Department (HOSPITAL_COMMUNITY)
Admission: EM | Admit: 2013-05-12 | Discharge: 2013-05-13 | Disposition: A | Discharge status: Home / Self Care | Payer: BC Managed Care – PPO | Attending: Emergency Medicine | Admitting: Emergency Medicine

## 2013-05-12 ENCOUNTER — Encounter (HOSPITAL_COMMUNITY): Payer: Self-pay | Admitting: *Deleted

## 2013-05-12 DIAGNOSIS — Z87442 Personal history of urinary calculi: Secondary | ICD-10-CM | POA: Insufficient documentation

## 2013-05-12 DIAGNOSIS — I10 Essential (primary) hypertension: Secondary | ICD-10-CM | POA: Insufficient documentation

## 2013-05-12 DIAGNOSIS — F319 Bipolar disorder, unspecified: Secondary | ICD-10-CM | POA: Insufficient documentation

## 2013-05-12 DIAGNOSIS — Z9884 Bariatric surgery status: Secondary | ICD-10-CM | POA: Insufficient documentation

## 2013-05-12 DIAGNOSIS — Z8719 Personal history of other diseases of the digestive system: Secondary | ICD-10-CM | POA: Insufficient documentation

## 2013-05-12 DIAGNOSIS — F39 Unspecified mood [affective] disorder: Secondary | ICD-10-CM | POA: Insufficient documentation

## 2013-05-12 DIAGNOSIS — K219 Gastro-esophageal reflux disease without esophagitis: Secondary | ICD-10-CM | POA: Insufficient documentation

## 2013-05-12 DIAGNOSIS — H5316 Psychophysical visual disturbances: Secondary | ICD-10-CM | POA: Insufficient documentation

## 2013-05-12 DIAGNOSIS — Z79899 Other long term (current) drug therapy: Secondary | ICD-10-CM | POA: Insufficient documentation

## 2013-05-12 DIAGNOSIS — D649 Anemia, unspecified: Secondary | ICD-10-CM | POA: Insufficient documentation

## 2013-05-12 DIAGNOSIS — F17200 Nicotine dependence, unspecified, uncomplicated: Secondary | ICD-10-CM | POA: Insufficient documentation

## 2013-05-12 HISTORY — DX: Bariatric surgery status: Z98.84

## 2013-05-12 LAB — RAPID URINE DRUG SCREEN, HOSP PERFORMED
Amphetamines: NOT DETECTED
Barbiturates: NOT DETECTED
Benzodiazepines: POSITIVE — AB
Cocaine: NOT DETECTED
Opiates: NOT DETECTED
Tetrahydrocannabinol: POSITIVE — AB

## 2013-05-12 LAB — URINE MICROSCOPIC-ADD ON

## 2013-05-12 LAB — URINALYSIS, ROUTINE W REFLEX MICROSCOPIC
Glucose, UA: NEGATIVE mg/dL
Hgb urine dipstick: NEGATIVE
Ketones, ur: NEGATIVE mg/dL
Nitrite: NEGATIVE
Protein, ur: NEGATIVE mg/dL
Specific Gravity, Urine: 1.039 — ABNORMAL HIGH (ref 1.005–1.030)
Urobilinogen, UA: 1 mg/dL (ref 0.0–1.0)
pH: 5 (ref 5.0–8.0)

## 2013-05-12 MED ORDER — GI COCKTAIL ~~LOC~~
30.0000 mL | Freq: Once | ORAL | Status: AC
  Administered 2013-05-13: 30 mL via ORAL
  Filled 2013-05-12: qty 30
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
## 2013-05-13 ENCOUNTER — Encounter (HOSPITAL_COMMUNITY): Payer: Self-pay | Admitting: Registered Nurse

## 2013-05-13 DIAGNOSIS — F39 Unspecified mood [affective] disorder: Secondary | ICD-10-CM

## 2013-05-13 LAB — CBC
HCT: 41.3 % (ref 39.0–52.0)
Hemoglobin: 14 g/dL (ref 13.0–17.0)
RBC: 4.55 MIL/uL (ref 4.22–5.81)
WBC: 5 10*3/uL (ref 4.0–10.5)

## 2013-05-13 LAB — COMPREHENSIVE METABOLIC PANEL
ALT: 16 U/L (ref 0–53)
Alkaline Phosphatase: 106 U/L (ref 39–117)
CO2: 28 mEq/L (ref 19–32)
Chloride: 104 mEq/L (ref 96–112)
GFR calc Af Amer: >90 mL/min (ref >90)
GFR calc non Af Amer: >90 mL/min (ref >90)
Glucose, Bld: 91 mg/dL (ref 70–99)
Potassium: 4.7 mEq/L (ref 3.5–5.1)
Sodium: 141 mEq/L (ref 135–145)
Total Bilirubin: 0.5 mg/dL (ref 0.3–1.2)
Total Protein: 6.6 g/dL (ref 6.0–8.3)

## 2013-05-13 MED ORDER — SUCRALFATE 1 G PO TABS
1.0000 g | ORAL_TABLET | Freq: Three times a day (TID) | ORAL | Status: DC
  Filled 2013-05-13 (×5): qty 1

## 2013-05-13 MED ORDER — ALUM & MAG HYDROXIDE-SIMETH 200-200-20 MG/5ML PO SUSP: 30.0000 mL | ORAL | Status: DC | PRN

## 2013-05-13 MED ORDER — SUCRALFATE 1 GM/10ML PO SUSP
1.0000 g | Freq: Three times a day (TID) | ORAL | Status: DC
  Administered 2013-05-13 (×2): 1 g via ORAL
  Filled 2013-05-13 (×5): qty 10

## 2013-05-13 MED ORDER — CYANOCOBALAMIN 500 MCG PO TABS
500.0000 ug | ORAL_TABLET | Freq: Every day | ORAL | Status: DC
  Administered 2013-05-13: 500 ug via ORAL
  Filled 2013-05-13: qty 1

## 2013-05-13 MED ORDER — MISOPROSTOL 200 MCG PO TABS
200.0000 ug | ORAL_TABLET | Freq: Once | ORAL | Status: DC
  Filled 2013-05-13: qty 1

## 2013-05-13 MED ORDER — ACETAMINOPHEN 325 MG PO TABS
650.0000 mg | ORAL_TABLET | ORAL | Status: DC | PRN
  Administered 2013-05-13: 650 mg via ORAL
  Filled 2013-05-13: qty 2

## 2013-05-13 MED ORDER — NICOTINE 21 MG/24HR TD PT24
21.0000 mg | MEDICATED_PATCH | Freq: Every day | TRANSDERMAL | Status: DC
  Administered 2013-05-13: 21 mg via TRANSDERMAL
  Filled 2013-05-13: qty 1

## 2013-05-13 MED ORDER — ONDANSETRON HCL 4 MG PO TABS: 4.0000 mg | ORAL_TABLET | Freq: Three times a day (TID) | ORAL | Status: DC | PRN

## 2013-05-13 MED ORDER — PANTOPRAZOLE SODIUM 40 MG PO TBEC
40.0000 mg | DELAYED_RELEASE_TABLET | Freq: Every day | ORAL | Status: DC
  Administered 2013-05-13: 40 mg via ORAL
  Filled 2013-05-13: qty 1

## 2013-05-13 MED ORDER — LORAZEPAM 1 MG PO TABS: 1.0000 mg | ORAL_TABLET | Freq: Three times a day (TID) | ORAL | Status: DC | PRN

## 2013-05-13 NOTE — ED Notes (Signed)
Pt. On phone to wife, pt. Calm.

## 2013-05-13 NOTE — BH Assessment (Signed)
BHH Assessment Progress Note      Consulted with adult unit, planning for several discharges on the 500 hall this afternoon. Plan to admit him to 503-2.

## 2013-05-13 NOTE — ED Notes (Signed)
Discharge note- patient discharge instructions reviewed/AVS provided to patient. Pt aware of discharge follow up plan. One bag of belongings returned to patient.  No signs of acute decompensation at this time, pt affect bright - denies any pain. Denies any further acute concerns at this time.Patient escorted from unit with staff, with wife.

## 2013-05-13 NOTE — ED Provider Notes (Addendum)
Sleeping this am.  Awaiting psych placement.  Filed Vitals:   05/13/13 0549  BP: 117/66  Pulse: 52  Temp: 98.3 F (36.8 C)  Resp: 18    Psychiatric treatment per psychiatric team.  Pt is medically stable.    Celene Kras, MD 05/13/13 [phone]  Pt seen by Psych team.  Stable for discharge  Celene Kras, MD 05/13/13 [phone]
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
## 2013-05-13 NOTE — BHH Counselor (Signed)
Patient to be discharged home per recommendations of Alvy Beal, NP. Pt is interested in Psych IOP at [facility]. Pt's first day of Psych IOP is Wednesday May 19, 2013. Pt told to present at 8:45am on the first day only for paperwork. Pt will then continue the program as schedule. All of the above details were discussed with Irish Lack for IOP.
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
## 2013-05-13 NOTE — ED Notes (Signed)
Patient called requesting note to be out of work. Patient can only be given note for time in ED no extra time authorized per [facility].

## 2013-05-13 NOTE — BH Assessment (Signed)
B[facility] Assessment Progress Note      Expect a discharge on the 500 hall this afternoon, client accepted to bed 503-2 when its available.

## 2013-05-14 ENCOUNTER — Ambulatory Visit (INDEPENDENT_AMBULATORY_CARE_PROVIDER_SITE_OTHER): Payer: BC Managed Care – PPO | Admitting: Family Medicine

## 2013-05-14 ENCOUNTER — Encounter: Payer: Self-pay | Admitting: Family Medicine

## 2013-05-14 VITALS — BP 150/70 | Temp 97.8°F | Wt 152.0 lb

## 2013-05-14 DIAGNOSIS — K283 Acute gastrojejunal ulcer without hemorrhage or perforation: Secondary | ICD-10-CM

## 2013-05-14 DIAGNOSIS — F39 Unspecified mood [affective] disorder: Secondary | ICD-10-CM
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
## 2013-05-17 ENCOUNTER — Ambulatory Visit: Payer: BC Managed Care – PPO | Admitting: Physical Therapy

## 2013-05-18 ENCOUNTER — Ambulatory Visit: Payer: Self-pay | Admitting: Internal Medicine

## 2013-05-20 ENCOUNTER — Ambulatory Visit: Payer: BC Managed Care – PPO | Admitting: Physical Therapy

## 2013-05-24 ENCOUNTER — Ambulatory Visit: Payer: BC Managed Care – PPO | Admitting: Physical Therapy

## 2013-05-26 ENCOUNTER — Ambulatory Visit: Payer: Self-pay | Admitting: Internal Medicine

## 2013-05-26 NOTE — ED Provider Notes (Signed)
Medical screening examination/treatment/procedure(s) were performed by non-physician practitioner and as supervising physician I was immediately available for consultation/collaboration.  Derwood Kaplan, MD 05/26/13 0040

## 2013-05-27 ENCOUNTER — Encounter: Payer: Self-pay | Admitting: Physical Therapy

## 2013-05-27 ENCOUNTER — Ambulatory Visit (INDEPENDENT_AMBULATORY_CARE_PROVIDER_SITE_OTHER): Payer: BC Managed Care – PPO | Admitting: Surgery

## 2013-05-31 ENCOUNTER — Telehealth: Payer: Self-pay | Admitting: General Practice

## 2013-05-31 ENCOUNTER — Encounter: Payer: Self-pay | Admitting: Internal Medicine

## 2013-05-31 ENCOUNTER — Ambulatory Visit (AMBULATORY_SURGERY_CENTER): Payer: BC Managed Care – PPO | Admitting: Internal Medicine

## 2013-05-31 ENCOUNTER — Encounter (HOSPITAL_COMMUNITY): Payer: Self-pay | Admitting: Psychology

## 2013-05-31 ENCOUNTER — Ambulatory Visit: Payer: BC Managed Care – PPO | Admitting: Physical Therapy

## 2013-05-31 VITALS — BP 105/62 | HR 69 | Temp 98.6°F | Resp 18 | Ht 66.0 in | Wt 149.0 lb

## 2013-05-31 DIAGNOSIS — K283 Acute gastrojejunal ulcer without hemorrhage or perforation: Secondary | ICD-10-CM

## 2013-05-31 HISTORY — PX: ESOPHAGOGASTRODUODENOSCOPY: SHX1529

## 2013-05-31 MED ORDER — SODIUM CHLORIDE 0.9 % IV SOLN: 500.0000 mL | INTRAVENOUS | Status: DC

## 2013-05-31 NOTE — Op Note (Signed)
[facility] [address] [place], [address]   ENDOSCOPY PROCEDURE REPORT  PATIENT: Tony, Davila  MR#: [id number] BIRTHDATE: 1981-01-21 , 32  yrs. old GENDER: Male ENDOSCOPIST: Iva Boop, MD, [facility] PROCEDURE DATE:  05/31/2013 PROCEDURE:  EGD, diagnostic ASA CLASS:     Class II INDICATIONS:  ulcer follow-up. MEDICATIONS: propofol (Diprivan) 350mg  IV, MAC sedation, administered by CRNA, and These medications were titrated to patient response per physician's verbal order TOPICAL ANESTHETIC: Cetacaine Spray  DESCRIPTION OF PROCEDURE: After the risks benefits and alternatives of the procedure were thoroughly explained, informed consent was obtained.  The LB [id number] endoscope was introduced through the mouth and advanced to the proximal jejunum. Without limitations.  The instrument was slowly withdrawn as the mucosa was fully examined.        JEJUNUM: Scaring was seen in the jejunum.  There was a single small scar on the jejunal side of the gastroenterostomy.  The remainder of the upper endoscopy exam was otherwise normal s/p gastroenterostomy though the anastomosis was friable.  Retroflexed views revealed no abnormalities.     The scope was then withdrawn from the patient and the procedure completed.  COMPLICATIONS: There were no complications. ENDOSCOPIC IMPRESSION: 1.   Scaring was seen in the jejunum - ulcer healed. 2.   The remainder of the upper endoscopy exam was otherwise normal  RECOMMENDATIONS: Continue current meds and see Dr. Johna Davila as planned. see me as needed. Could consider reducing PPi but I would leave him on sucralfate. He could not tolerate misoprostol due to headaches.   eSigned:  Iva Boop, MD, [facility] 05/31/2013 4:12 PM CC:The Patient and Glenna Fellows, MD

## 2013-05-31 NOTE — Telephone Encounter (Signed)
Representative from [employer] calling to check status of disability form that was faxed to our office on June 24th.  Request was made to fax form again

## 2013-05-31 NOTE — Patient Instructions (Addendum)
The ulcer has healed. Please stay on current therapy and keep follow-up with Dr. Johna Sheriff. I will be available if needed.  I appreciate the opportunity to care for you. Iva Boop, MD, [facility]  Discharge instructions given with verbal understanding. Resume previous medications. YOU HAD AN ENDOSCOPIC PROCEDURE TODAY AT THE [facility]: Refer to the procedure report that was given to you for any specific questions about what was found during the examination.  If the procedure report does not answer your questions, please call your gastroenterologist to clarify.  If you requested that your care partner not be given the details of your procedure findings, then the procedure report has been included in a sealed envelope for you to review at your convenience later.  YOU SHOULD EXPECT: Some feelings of bloating in the abdomen. Passage of more gas than usual.  Walking can help get rid of the air that was put into your GI tract during the procedure and reduce the bloating. If you had a lower endoscopy (such as a colonoscopy or flexible sigmoidoscopy) you may notice spotting of blood in your stool or on the toilet paper. If you underwent a bowel prep for your procedure, then you may not have a normal bowel movement for a few days.  DIET: Your first meal following the procedure should be a light meal and then it is ok to progress to your normal diet.  A half-sandwich or bowl of soup is an example of a good first meal.  Heavy or fried foods are harder to digest and may make you feel nauseous or bloated.  Likewise meals heavy in dairy and vegetables can cause extra gas to form and this can also increase the bloating.  Drink plenty of fluids but you should avoid alcoholic beverages for 24 hours.  ACTIVITY: Your care partner should take you home directly after the procedure.  You should plan to take it easy, moving slowly for the rest of the day.  You can resume normal activity the day after the  procedure however you should NOT DRIVE or use heavy machinery for 24 hours (because of the sedation medicines used during the test).    SYMPTOMS TO REPORT IMMEDIATELY: A gastroenterologist can be reached at any hour.  During normal business hours, 8:30 AM to 5:00 PM Monday through Friday, call [phone].  After hours and on weekends, please call the GI answering service at [phone] who will take a message and have the physician on call contact you.   Following upper endoscopy (EGD)  Vomiting of blood or coffee ground material  New chest pain or pain under the shoulder blades  Painful or persistently difficult swallowing  New shortness of breath  Fever of 100F or higher  Black, tarry-looking stools  FOLLOW UP: If any biopsies were taken you will be contacted by phone or by letter within the next 1-3 weeks.  Call your gastroenterologist if you have not heard about the biopsies in 3 weeks.  Our staff will call the home number listed on your records the next business day following your procedure to check on you and address any questions or concerns that you may have at that time regarding the information given to you following your procedure. This is a courtesy call and so if there is no answer at the home number and we have not heard from you through the emergency physician on call, we will assume that you have returned to your regular daily activities without incident.  SIGNATURES/CONFIDENTIALITY: You and/or your care partner have signed paperwork which will be entered into your electronic medical record.  These signatures attest to the fact that that the information above on your After Visit Summary has been reviewed and is understood.  Full responsibility of the confidentiality of this discharge information lies with you and/or your care-partner.

## 2013-05-31 NOTE — Progress Notes (Signed)
Patient did not experience any of the following events: a burn prior to discharge; a fall within the facility; wrong site/side/patient/procedure/implant event; or a hospital transfer or hospital admission upon discharge from the facility. (G8907) Patient did not have preoperative order for IV antibiotic SSI prophylaxis. (G8918)  

## 2013-06-01 ENCOUNTER — Telehealth: Payer: Self-pay

## 2013-06-01 NOTE — Telephone Encounter (Signed)
  Follow up Call-  Call back number 05/31/2013 04/12/2013 02/18/2011  Post procedure Call Back phone  # [phone] hm [phone] hm  Permission to leave phone message Yes Yes -     Patient questions:  Do you have a fever, pain , or abdominal swelling? no Pain Score  0 *  Have you tolerated food without any problems? yes  Have you been able to return to your normal activities? yes  Do you have any questions about your discharge instructions: Diet   no Medications  no Follow up visit  no  Do you have questions or concerns about your Care? no  Actions: * If pain score is 4 or above: No action needed, pain <4.

## 2013-06-01 NOTE — Telephone Encounter (Signed)
Please advise 

## 2013-06-01 NOTE — Telephone Encounter (Signed)
Paper is on your desk

## 2013-06-02 ENCOUNTER — Ambulatory Visit: Payer: BC Managed Care – PPO | Admitting: Physical Therapy

## 2013-06-02 ENCOUNTER — Encounter (HOSPITAL_COMMUNITY): Payer: Self-pay

## 2013-06-02 ENCOUNTER — Other Ambulatory Visit (HOSPITAL_COMMUNITY): Payer: BC Managed Care – PPO | Attending: Psychiatry

## 2013-06-03 ENCOUNTER — Encounter: Payer: Self-pay | Admitting: Internal Medicine

## 2013-06-04 ENCOUNTER — Other Ambulatory Visit (HOSPITAL_COMMUNITY): Payer: BC Managed Care – PPO | Admitting: Psychology

## 2013-06-04 DIAGNOSIS — F1220 Cannabis dependence, uncomplicated: Secondary | ICD-10-CM

## 2013-06-04 DIAGNOSIS — F3160 Bipolar disorder, current episode mixed, unspecified: Secondary | ICD-10-CM | POA: Insufficient documentation

## 2013-06-04 DIAGNOSIS — F1110 Opioid abuse, uncomplicated: Secondary | ICD-10-CM | POA: Insufficient documentation

## 2013-06-04 MED ORDER — QUETIAPINE FUMARATE 50 MG PO TABS: 50.0000 mg | ORAL_TABLET | Freq: Every day | ORAL | Status: DC
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
## 2013-06-06 ENCOUNTER — Encounter (HOSPITAL_COMMUNITY): Payer: Self-pay | Admitting: Psychology

## 2013-06-06 DIAGNOSIS — F1220 Cannabis dependence, uncomplicated: Secondary | ICD-10-CM | POA: Insufficient documentation
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
## 2013-06-07 ENCOUNTER — Other Ambulatory Visit (HOSPITAL_COMMUNITY): Payer: BC Managed Care – PPO | Admitting: Psychology

## 2013-06-07 ENCOUNTER — Encounter: Payer: Self-pay | Admitting: Physical Therapy

## 2013-06-07 DIAGNOSIS — F1220 Cannabis dependence, uncomplicated: Secondary | ICD-10-CM

## 2013-06-08 ENCOUNTER — Encounter (HOSPITAL_COMMUNITY): Payer: Self-pay | Admitting: Psychology
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
## 2013-06-08 NOTE — Progress Notes (Signed)
    Daily Group Progress Note  Program: CD-IOP   Group Time: 1-2:30 pm  Participation Level: Active  Behavioral Response: Sharing  Type of Therapy: Process Group  Topic: Group Process: first part of session included check-in and members sharing what they had done for their recovery over the previous weekend. Two members had picked up 30 and 60 day chips. The group applauded this news and they admitted it felt good to get those markers. One member had not attended any meetings since last Monday while another did not attend the Saturday women's meeting or go to church. The importance of repetition was emphasized and the fact that these routines were disrupted is cause for concern and a recommitment to sobriety.   Group Time: 2:45- 4pm  Participation Level: Active  Behavioral Response: Appropriate and Sharing  Type of Therapy: Psycho-education Group  Topic: "The Neurobiology of Addiction". The second half of group was spent viewing the DVD on the neurobiology of addiction. Members listened carefully as the DVD described the events in the brain that lead up to addiction. The film was paused periodically and I emphasized or reiterated what they speaker had said. Members seemed surprised by the information presented and were able to understand how and why they have done the things they have regretted while actively using. The session was informative for everyone and emphasized the 'biological' basis of addiction.   Summary: The patient reported he had attended 2 meetings over the weekend.   Family Program: Family present? No   Name of family member(s):   UDS collected: yes Results: not back from lab yet  AA/NA attended?: YesSaturday and Sunday  Sponsor?: No, but he is very new to recovery   Kharee Lesesne, LCAS

## 2013-06-09 ENCOUNTER — Ambulatory Visit: Payer: Self-pay

## 2013-06-09 ENCOUNTER — Other Ambulatory Visit (HOSPITAL_COMMUNITY): Payer: BC Managed Care – PPO | Admitting: Psychology

## 2013-06-10 ENCOUNTER — Encounter (HOSPITAL_COMMUNITY): Payer: Self-pay | Admitting: Psychology

## 2013-06-10 ENCOUNTER — Ambulatory Visit (INDEPENDENT_AMBULATORY_CARE_PROVIDER_SITE_OTHER): Payer: BC Managed Care – PPO | Admitting: General Surgery

## 2013-06-10 NOTE — Progress Notes (Signed)
    Daily Group Progress Note  Program: CD-IOP   Group Time: 1-2:30 pm  Participation Level: Active  Behavioral Response: Appropriate and Sharing  Type of Therapy: Process Group  Topic: Group process: the first half of group was spent in process. Members shared about their current issues and concerns in early recovery. One member was very quiet and a fellow member asked about this. The member admitted that he was miserable and had begun questioning why he was choosing sobriety if it was so miserable. This disclosure proved to be very surprising to most of the group because the disclosing member has been the group "clown" since entering the program. To hear that he had been thinking about actually walking out of the session, questioning his drinking, or actually having thoughts of hurting himself, was a shock to everyone and there was silence as his feelings were absorbed. The remainder of the session was spent discussing the challenges and actual pain of early recovery and the difficulties that might be expected. Members shared how they felt about this member and the caring and love they had come to feel towards him. They also expressed frustration that they couldn't really help him feel better or, in some way, ease his pain. The session was not a step-back for the group, but a recognition that early recovery isn't always fun, might be painful, and could lead one back to choosing their addiction over sobriety. It proved to be a 'sobering' session.  Group Time: 2:45- 4pm  Participation Level: Active  Behavioral Response: Sharing  Type of Therapy: Psycho-education Group  Topic: Identifying Values/Graduation: the second half of group was spent in a psycho-ed piece on Values. Included in the presentation was a handout which identified many different values and asked members to identify 3 things they can do to demonstrate those values. One member identified "Dependability" as a value she wanted  to live out on a daily basis and identified behaviors/actions she would carry out to demonstrate that value. At the conclusion of the session, a graduation ceremony was held for a member who was leaving having successfully completed the program. There were tears and kind words expressed to this woman who has been such a powerful presence in this program.  Summary: The patient reported he had attended an NA meeting last night. He noted that in NA they really "hug a lot"! The patient explained that everyone gives hugs to each other in the NA meetings and this physical closeness is new to him and a little awkward. I pointed out that the since entering the program, the patient has attended 4 meetings in the last 4 days. He informed the group that he was going to do "90 in 90" and they applauded this determination. The patient showed the group his starter key chain. The patient provided good feedback and support for the member who is struggling in recovery and assured him that he had had the same kind of hopeless feelings and thoughts in past years over his drug and alcohol use. The patient is doing very well in group and seems very comfortable making comments or sharing his feelings in session. His sobriety date is 7/9.   Family Program: Family present? No   Name of family member(s):   UDS collected: No Results:  AA/NA attended?: YesMonday, Tuesday, Saturday and Sunday  Sponsor?: No, but he is looking for one   Haylen Shelnutt, LCAS

## 2013-06-11 ENCOUNTER — Other Ambulatory Visit (HOSPITAL_COMMUNITY): Payer: BC Managed Care – PPO | Attending: Psychiatry | Admitting: Psychology

## 2013-06-11 DIAGNOSIS — F1220 Cannabis dependence, uncomplicated: Secondary | ICD-10-CM | POA: Insufficient documentation

## 2013-06-11 DIAGNOSIS — F1110 Opioid abuse, uncomplicated: Secondary | ICD-10-CM | POA: Insufficient documentation

## 2013-06-11 DIAGNOSIS — F3160 Bipolar disorder, current episode mixed, unspecified: Secondary | ICD-10-CM | POA: Insufficient documentation

## 2013-06-12 ENCOUNTER — Encounter (HOSPITAL_COMMUNITY): Payer: Self-pay | Admitting: Psychology

## 2013-06-12 NOTE — Progress Notes (Signed)
    Daily Group Progress Note  Program: CD-IOP   Group Time: 1-2:45 pm  Participation Level: Active  Behavioral Response: Appropriate and Sharing  Type of Therapy: Process Group  Topic: Group Process: the first part of group was spent in process. One member had experienced a terrible loss with the news that her boyfriend had overdosed and died yesterday. The patient had appeared for the session accompanied by her mother and sister. The group members offered their condolences and shared their ways of having dealt with pain and loss in the past. They applauded and honored her willingness to be here in group today.  I presented a very brief reminder on the autonomy we all have when making choices and emphasized that no one is responsible for any one else's behavior. The patient received good feedback and support and her pain was shouldered as much as possible by her fellow group members. Prior to the break, a clip was shown from the DVD, "Drunk". It included a brief monologue from a woman in an AA meeting and the way her addiction had affected her son. It was very powerful and brought some group members to tears.   Group Time: 3-4 pm  Participation Level: Active  Behavioral Response: Sharing  Type of Therapy: Psycho-education Group  Topic: Values, Part 2: the second half of group was spent in a psycho-education session on the importance of identifying values in early recovery as well as behaviors that demonstrate those values. During this time, the medical director met with a number of patients for medication checks. He also met with the grieving patient and her family to address anxiety and issues he may be able to assist her with during the period. As the conclusion of the session neared, members shared about their recovery-related plans for the weekend.     Summary: The patient reported he had attended 2 more meetings and now has been to 7 meetings in the 7 days since he started the  program. He also applauded the young member's courage in coming to group today and reminded her that this was "the best place for you to be today".  The patient shared about his grief and agreed with another that when one is impaired, they can't feel or begin to grieve properly or in a way that heals the pain. The patient made some good comments and provided excellent feedback. He really has done remarkably well considering he only entered the program 4 sessions ago.  His sobriety date is 7/9.  Family Program: Family present? No   Name of family member(s):   UDS collected: No Results:   AA/NA attended?: YesWednesday and Thursday  Sponsor?: No, but he is actively looking for a sponsor   Almas Rake, LCAS

## 2013-06-14 ENCOUNTER — Encounter: Payer: Self-pay | Admitting: Physical Therapy

## 2013-06-14 ENCOUNTER — Other Ambulatory Visit (HOSPITAL_COMMUNITY): Payer: BC Managed Care – PPO | Admitting: Psychology

## 2013-06-14 DIAGNOSIS — F1220 Cannabis dependence, uncomplicated: Secondary | ICD-10-CM

## 2013-06-16 ENCOUNTER — Other Ambulatory Visit (HOSPITAL_COMMUNITY): Payer: BC Managed Care – PPO | Attending: Psychiatry | Admitting: Psychology

## 2013-06-16 ENCOUNTER — Encounter (HOSPITAL_COMMUNITY): Payer: Self-pay | Admitting: Psychology

## 2013-06-16 DIAGNOSIS — F3160 Bipolar disorder, current episode mixed, unspecified: Secondary | ICD-10-CM | POA: Insufficient documentation

## 2013-06-16 DIAGNOSIS — F1220 Cannabis dependence, uncomplicated: Secondary | ICD-10-CM

## 2013-06-16 DIAGNOSIS — F1110 Opioid abuse, uncomplicated: Secondary | ICD-10-CM | POA: Insufficient documentation

## 2013-06-16 NOTE — Progress Notes (Signed)
    Daily Group Progress Note  Program: CD-IOP   Group Time: 1-2:30 pm  Participation Level: Active  Behavioral Response: Appropriate and Sharing  Type of Therapy: Process Group  Topic: "Family Day". The first of group was spent in process. Members checked-in and they also introduced the family members that they had brought with them for today's Family Session. During this time, I described some of the way the group operates. Included in this was a review of the past weekend and the recovery issues and concerns experienced by group members. Members discussed the events of the weekend, including meetings attended. Family members offered comments and were asked to share their feelings about their loved ones and how they might have changed since they entered the program? There was good disclosure and sharing among the group and members and family seemed to share freely during this time.   Group Time: 2:45- 4pm  Participation Level: Active  Behavioral Response: Appropriate  Type of Therapy: Psycho-education Group  Topic: Recovery 101: a basic introduction to addiction and recovery was provided to the group, including a brief feature of the DVD, "Addiction". It included an addiction specialist describing the nature of this brain disease. During this time, members shared about what they have done to make changes in their daily lives and how they have remained alcohol and drug-free. Some questions were asked about dealing with the pain that the addict has caused for loved ones and how to deal with that pain. There was good discussion and the session proved very effective for everyone present.    Summary: The patient reported he had attended 3 meetings this weekend. He and another group member also met at a funeral home and attended the visitation for one of their other group member's boyfriends. He had died last week after a heroin overdose. The patient admitted he just wanted to go and show  support for her recovery. The patient made some good comments about recovery and was very open about his behaviors when he was using. He reported he has good support from his wife and an aunt, who is also in recovery, but he is getting a lot of good support and meeting good people at the meetings. He continues to make excellent progress and is very open about his drug use. His sobriety date is 7/9.     Family Program: Family present? No   Name of family member(s):   UDS collected: Yes Results: not back yet  AA/NA attended?: YesFriday, Saturday and Sunday  Sponsor?: No, but he is looking for one.    Tony Davila, LCAS

## 2013-06-17 ENCOUNTER — Encounter (HOSPITAL_COMMUNITY): Payer: Self-pay | Admitting: Psychology

## 2013-06-17 NOTE — Progress Notes (Signed)
    Daily Group Progress Note  Program: CD-IOP   Group Time: 1-2:30 pm  Participation Level: Active  Behavioral Response: Appropriate and Sharing  Type of Therapy: Psycho-education Group  Topic: Check-in and Assertive Communication: the first part of group was spent in a psycho-ed session. After checking-in and sharing about current issues, handouts were provided on the different types of communication. The remainder of the session was spent identifying and discussing the different types of communication. A number of members shared about their early life and lack of communication and expediencies that resulted from those unhealthy ways of relating. The session proved enlightening and informative.   Group Time: 2:45- 4pm  Participation Level: Active  Behavioral Response: Appropriate and Sharing  Type of Therapy: Education and Training Group  Topic: The second part of the session was spent in a psycho-ed piece with Eloisa Northern, a therapist here at  [facility]. She provided education on heart math, a practice that teaches patients to learn how to control heart arte and deal with more effectively with stress. After a brief introduction, group members volunteered to "hook up" and go through the heart math exercise. Out of 4 group members, all were very stressed out at the beginning of the exercise, but everyone of them experienced and displayed a marked reduction in stress as evidenced by their heart rate. The session was informative and proved very surprising and compelling to group members. The session proved very effective.    Summary: the patient reported he is doing okay. He attended 2 meetings since the last session, identified a home group and has secured a sponsor. This was great news and the group applauded him. The patient made some good comments about passive behaviors and agreed with another member that it is very frustrating when someone refuses to make a decision. In the [facility]  training, the patient volunteered to be the first one hooked up and tested. He did exceptionally well and was able to move entirely into the green with the focus on breathing. He admitted later that he was familiar with this type of bio-feedback and had done it before. He agreed with the facilitator that he felt very much at peace and calmer at the conclusion of the exercise. This group member has really stepped up and is attending 12-step meetings everyday. His sobriety date remains 7/9.    Family Program: Family present? No   Name of family member(s):   UDS collected: No Results:   AA/NA attended?: [place] and Tuesday  Sponsor?: Yes, he just secured a sponsor Tuesday evening.    Drevin Ortner, LCAS

## 2013-06-18 ENCOUNTER — Other Ambulatory Visit (HOSPITAL_COMMUNITY): Payer: BC Managed Care – PPO | Attending: Psychiatry | Admitting: Psychology

## 2013-06-18 DIAGNOSIS — F3160 Bipolar disorder, current episode mixed, unspecified: Secondary | ICD-10-CM | POA: Insufficient documentation

## 2013-06-18 DIAGNOSIS — F1220 Cannabis dependence, uncomplicated: Secondary | ICD-10-CM | POA: Insufficient documentation

## 2013-06-18 DIAGNOSIS — F1110 Opioid abuse, uncomplicated: Secondary | ICD-10-CM | POA: Insufficient documentation

## 2013-06-21 ENCOUNTER — Other Ambulatory Visit (HOSPITAL_COMMUNITY): Payer: Self-pay | Admitting: Physician Assistant

## 2013-06-21 ENCOUNTER — Encounter (HOSPITAL_COMMUNITY): Payer: Self-pay | Admitting: Psychology

## 2013-06-21 ENCOUNTER — Other Ambulatory Visit (HOSPITAL_COMMUNITY): Payer: BC Managed Care – PPO | Attending: Psychiatry | Admitting: Psychology

## 2013-06-21 DIAGNOSIS — F1110 Opioid abuse, uncomplicated: Secondary | ICD-10-CM | POA: Insufficient documentation

## 2013-06-21 DIAGNOSIS — F3160 Bipolar disorder, current episode mixed, unspecified: Secondary | ICD-10-CM | POA: Insufficient documentation

## 2013-06-21 DIAGNOSIS — F1220 Cannabis dependence, uncomplicated: Secondary | ICD-10-CM | POA: Insufficient documentation

## 2013-06-21 MED ORDER — QUETIAPINE FUMARATE 100 MG PO TABS: 100.0000 mg | ORAL_TABLET | Freq: Every day | ORAL | Status: DC

## 2013-06-21 NOTE — Telephone Encounter (Signed)
Patient approached provider and stated his wife accidentally spilled some of his Seroquel into bleach water, and he has run out. When asked if the medication was effective, he stated it helps him to sleep but has not helped his level of irritability. Will increase Seroquel to 100 mg at bedtime. 30 day prescription sent to pharmacy.

## 2013-06-21 NOTE — Progress Notes (Signed)
    Daily Group Progress Note  Program: CD-IOP   Group Time: 1-2:30 pm  Participation Level: Active  Behavioral Response: Appropriate and Sharing  Type of Therapy: Process Group  Topic: Group Process: the first part of group was spent in process. Members shared their current issues and concerns. There was a new group member present today and he voluntarily shared a little bit about himself and his reasons for being here. During this part of group, the medical director was meeting with current members or medication checks as well as new members and their initial session. One member shared about anger and frustration about the behavior of some in NA while another shared about her dilemma that occurred yesterday while she was walking. It proved to be a good session with open and honest disclosure and opinions about ways to handle one's self in everyday life.     Group Time: 2:45- 4pm  Participation Level: Active  Behavioral Response: Sharing  Type of Therapy: Education and Training Group  Topic: HeartMath: the second part of group was spent in an Multimedia programmer. Members who had not participated in the activity on Wednesday were "hooked up" today. During this session, the medical director met with another member who had missed the previous two Friday sessions. The new group member struggled getting into the 'green', but all other members were able to move into the green entirely, and/or avoid the red altogether. This activity demonstrated the power of breath in emotional balance and well-being. The session ended with a graduation. The member graduating had been a powerful and important part of the group and there were powerful emotions expressed as we passed the 'coin' around. She expressed her appreciation to the group and how important this time had been for her. "It save my life", she stated, and the group applauded her commitment to recovery.   Summary: The  patient reported he had attended 2 meetings since the last group. He was pleased to learn that his most recent drug test was negative for THC. The patient reported now he could seek to complete interviews. He reported finances are a real struggle right now and he and his wife really need to spend more time together by themselves. He reminded the group that he and his wife live with his grandmother and are providing virtually all of the care for her. The patient had completed the [facility] during the last session and made a few comments while he watched his fellow group members complete their opportunities to experience this process. During the graduation he had heartfelt words for the graduating member and wished her well. The patient has been surprising compliant since entering the program and has verbalized his commitment to attend at least one meeting per day. His sobriety date remains 7/9.   Family Program: Family present? No   Name of family member(s):   UDS collected: No Results:  AA/NA attended?: YesWednesday and Thursday  Sponsor?: Yes   Sim Choquette, LCAS

## 2013-06-22 ENCOUNTER — Encounter (HOSPITAL_COMMUNITY): Payer: Self-pay | Admitting: Psychology

## 2013-06-22 NOTE — Progress Notes (Signed)
    Daily Group Progress Note  Program: CD-IOP   Group Time: 1-2:30 pm  Participation Level: Active  Behavioral Response: Appropriate and Sharing  Type of Therapy: Process Group  Topic: Group process; the first part of group was spent in process. Members shared about the past weekend and what they had done to support their recovery. The newest member admitted that he had drunk on Saturday night. He admitted that it was his wife's 75th birthday party and they had a big crowd at the house and "everyone was having a good time". He admitted he had snuck a couple of drinks, but his wife hadn't seen him and he had not told her. The group processed the 'slip' and discussed possible ways he could have addressed the temptation and avoided drinking. there was good feedback and discussion and the member who had relapsed, reported he realized that recovery involved making plans ahead of time and preparing for different scenarios. The session proved very insightful to all.  Group Time: 2:45- 4pm  Participation Level: Active  Behavioral Response: Appropriate and Sharing  Type of Therapy: Psycho-education Group  Topic: Dreams/Sleep Hygiene/Graduation: the second half of group was spent in a psycho-ed piece on sleep. Included were handouts about the different stages of sleep and the purpose or ways in which each stage benefits the individual. The handout on sleep hygiene identified the importance of preparing for sleep so when one does lie down, they go to sleep. While many of the group members raised their hands when asked about any sleep disorders, it appears that only a few have ever practiced a specific routine in preparation for sleep. Present today was a Consulting civil engineer from the [employer]. She engaged in the discussion and offered her own perspectives when appropriate. The graduation ceremony was held and the group bid farewell to a very powerful and important member of the group. They  expressed appreciation for his guidance and support and the ceremony brought tears to the member successfully leaving the program.   Summary: The patient reported he had had attended meetings everyday over the weekend. He provided good support for his fellow member who had relapsed over the weekend. The patient did not share much of significance and I asked if he would talk about what he has told me? He admitted to the group that his marriage was suffering. He recounted the time he and his wife spend with his grandmother as caregivers, but noted he had said something to his uncle about moving out and had immediately gotten the weekend off. The patient reported they had spent the weekend together and really felt closer. The group provided some suggestions to this member about helping his wife have more time and freedom away from caring for his grandmother. The patient noted she could go to meetings because at one time, 'she had been an alcoholic'. I reminded him that chemical dependency is never in the past tense and she could benefit from attending some AA meetings. He agreed and noted he had thought about her going to the women's meeting on Saturdays after he has gone to his men's meeting.    Family Program: Family present? No   Name of family member(s):   UDS collected: No Results:   AA/NA attended?: YesSaturday and Sunday  Sponsor?: Yes   Evaan Tidwell, LCAS

## 2013-06-23 ENCOUNTER — Other Ambulatory Visit (HOSPITAL_COMMUNITY): Payer: BC Managed Care – PPO | Attending: Psychiatry | Admitting: Psychology

## 2013-06-23 DIAGNOSIS — F1220 Cannabis dependence, uncomplicated: Secondary | ICD-10-CM

## 2013-06-23 DIAGNOSIS — F1110 Opioid abuse, uncomplicated: Secondary | ICD-10-CM | POA: Insufficient documentation

## 2013-06-23 DIAGNOSIS — F3160 Bipolar disorder, current episode mixed, unspecified: Secondary | ICD-10-CM | POA: Insufficient documentation

## 2013-06-24 ENCOUNTER — Encounter (HOSPITAL_COMMUNITY): Payer: Self-pay | Admitting: Psychology

## 2013-06-24 NOTE — Progress Notes (Signed)
    Daily Group Progress Note  Program: CD-IOP   Group Time: 1-2:30 pm  Participation Level: Active  Behavioral Response: Appropriate and Sharing  Type of Therapy: Education and Training Group  Topic: Group Process: The second half of group was spent in process. There were 3 new group members present today and 2 shared briefly about themselves. There were questions, particularly about the new member who is a gambling addict. I explained that there are different types of addictions. Some entail ingesting something, like alcohol, food, and drugs while some are "process" addictions, like gambling, workaholism, or shopping. The session proved very educational and brought the members closer toge4ther. Prior to ending the session, members were reminded of the cook out scheduled for Friday and members agreed they would bring the side dishes as they had offered.   Group Time: 2:45- 4pm  Participation Level: Minimal  Behavioral Response: Sharing  Type of Therapy: Process Group  Topic: Group Process: The second half of group was spent in process. There were 3 new group members present today and 2 shared briefly about themselves. There were questions, particularly about the new member who is a gambling addict. I explained that there are different types of addictions. Some entail ingesting something, like alcohol, food, and drugs while some are "process" addictions, like gambling, workaholism, or shopping. The session proved very educational and brought the members closer toge4ther. Prior to ending the session, members were reminded of the cook out scheduled for Friday and members agreed they would bring the side dishes as they had offered.   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 He was familiar with his inability to absorb the nutrients that he needs because of his surgically altered stomach. The patient complained about  the taste of the protein shakes, but the dietician suggested a new product that he had was not familiar with. He was able to relate to another group member who admitted he really doesn't enjoy eating and food doesn't have a lot of meaning for him. This patient reported he continues to attend meetings daily and is making good progress in his recovery. His sobriety date remains 7/9.    Family Program: Family present? No   Name of family member(s):   UDS collected: No Results: but his previous drug test was negative for [facility] and all other drugs  AA/NA attended?: YesMonday and Tuesday  Sponsor?: Yes   Lashawn Orrego, LCAS

## 2013-06-25 ENCOUNTER — Other Ambulatory Visit (HOSPITAL_COMMUNITY): Payer: BC Managed Care – PPO | Attending: Psychiatry | Admitting: Psychology

## 2013-06-25 DIAGNOSIS — F1220 Cannabis dependence, uncomplicated: Secondary | ICD-10-CM

## 2013-06-25 DIAGNOSIS — F3160 Bipolar disorder, current episode mixed, unspecified: Secondary | ICD-10-CM | POA: Insufficient documentation

## 2013-06-25 DIAGNOSIS — F1110 Opioid abuse, uncomplicated: Secondary | ICD-10-CM | POA: Insufficient documentation

## 2013-06-27 ENCOUNTER — Encounter (HOSPITAL_COMMUNITY): Payer: Self-pay | Admitting: Psychology

## 2013-06-27 NOTE — Progress Notes (Addendum)
    Daily Group Progress Note  Program: CD-IOP   Group Time: 1-2:30 pm  Participation Level: Active  Behavioral Response: Appropriate and Sharing  Type of Therapy: Activity Group  Topic: Cook Out: the first part of group was spent eating lunch together. The medical director had grilled the hamburgers, veggie burgers, and hot dogs. Group members brought sides and everyone sat down together and talked while we ate. The member who was to graduate today was not present and had sent a text to a number of members reporting he had hurt his knee on the job and was going to the hospital.  Two other members were also absent. The session seemed to bring everyone a little closer and everyone agreed the food was great.   Group Time: 2:45- 4pm  Participation Level: Active  Behavioral Response: Appropriate and Sharing  Type of Therapy: Process Group  Topic: Group Process: the second part of group was spent in process. One member disclosed that his birthday was this Sunday and he was terrified that he would drink. This would be his first sober birthday since he was 32 yo. This generated lots of ideas and suggestions about how to prepare and what he plans to do to insure that he does remain alcohol and drug-free. The session ended with members sharing about the upcoming weekend and what their plans were. It was unpleasantly surprising to hear that a number of group members did not yet have plans despite it being almost 4 pm on Friday. The importance of making plans and commitments was discussed at length.   Summary: The patient was talkative and shared about himself during the luncheon. While others were showing pictures of their kids, he showed the group a picture of his dog, a rather overweight pug named Daisy. The patient was wearing a t-shirt with a pug on the front of it and a question about that generated this picture. He reported he had attended 2  meetings since the last group session. He is  enjoying his new sponsor and feels very good about being able to talk to him and he has a lot of respect for him. The patient reported he would attend meetings every day over the weekend and reminded the group that his grandmother was in respite care and he and his wife, [name], had the weekend to themselves. They would probably go to a movie and shop a little. The patient provided good feedback to his fellow group member about the upcoming weekend and noted he could come over to their house and they could spend some time. This patient assured the group he would be calling and checking on the group member with the birthday. He made some good comments and is very engaged and active in the Fellowship as we have asked of him. The patient's sobriety date remains 7/9.    Family Program: Family present? No   Name of family member(s):   UDS collected: Yes Results: not back yet  AA/NA attended?: YesWednesday and Thursday  Sponsor?: Yes   Marshelle Bilger, LCAS

## 2013-06-28 ENCOUNTER — Other Ambulatory Visit (HOSPITAL_COMMUNITY): Payer: BC Managed Care – PPO | Attending: Psychiatry | Admitting: Psychology

## 2013-06-28 DIAGNOSIS — F1110 Opioid abuse, uncomplicated: Secondary | ICD-10-CM | POA: Insufficient documentation

## 2013-06-28 DIAGNOSIS — F1220 Cannabis dependence, uncomplicated: Secondary | ICD-10-CM | POA: Insufficient documentation

## 2013-06-28 DIAGNOSIS — F3160 Bipolar disorder, current episode mixed, unspecified: Secondary | ICD-10-CM | POA: Insufficient documentation

## 2013-06-29 ENCOUNTER — Encounter (HOSPITAL_COMMUNITY): Payer: Self-pay | Admitting: Psychology

## 2013-06-29 NOTE — Progress Notes (Signed)
    Daily Group Progress Note  Program: CD-IOP   Group Time: 1-2:30 pm  Participation Level: Active  Behavioral Response: Appropriate and Sharing  Type of Therapy: Psycho-education Group  Topic: Group Process/Mind/Body/Spirit: the group began with check-in from the weekend. Members shared what they had done to support their recovery. Also discussed were obstacles or problems that may have appeared since we last met. A handout was provided to all group members titled, "Balance of Mind, Body, and Spirit". Members were asked to identify what they do to balance and feed each part of themselves. Each member came up and wrote down what they do in each area. The importance of finding balance was emphasized.   Group Time: 2:45- 4pm Participation Level: Active  Behavioral Response: Sharing  Type of Therapy: Activity Group  Topic: Family Sculpture: the second half of group was spent in an activity called "Family Sculpture". Members were asked to sculpt their 'biological family'. this included using other group members to represent each of their family members in a scene reflective of their family life when they were children. The first sculpture proved very interesting and painful as the male member sculpted a critical, argumentative, scene with her family. There was good discussion and feedback among group members.   Summary: The patient reported he had had a good weekend, although it had started off bad when he got home on Friday he had to take his wife to an urgent care because she has fallen and hurt her shoulder. Although she was sore, she hadn't hurt herself os badly she couldn't enjoy the weekend with him. They were alone because the the grandmother was in respite care. He admitted the privacy was really nice and they had a good time. he reported he had attended a meeting on Saturday and Sunday and spoken with his sponsor. The patient  reported he walked and played with his dog, read  literature on recovery, and worked on his spiritual growth. In the sculpture, the patient made some good comments and observations. He is doing everything we have asked of him and his sobriety date remains 7/9. He has scheduled an appointment to meet with myself along with his wife on Thursday. They had to cancel last week's appointment because she was ill.    Family Program: Family present? No   Name of family member(s):   UDS collected: No Results:   AA/NA attended?: YesSaturday and Sunday  Sponsor?: Yes   Teriah Muela, LCAS

## 2013-06-30 ENCOUNTER — Other Ambulatory Visit (HOSPITAL_COMMUNITY): Payer: BC Managed Care – PPO | Attending: Psychiatry | Admitting: Psychology

## 2013-06-30 DIAGNOSIS — F1110 Opioid abuse, uncomplicated: Secondary | ICD-10-CM | POA: Insufficient documentation

## 2013-06-30 DIAGNOSIS — F3160 Bipolar disorder, current episode mixed, unspecified: Secondary | ICD-10-CM | POA: Insufficient documentation

## 2013-06-30 DIAGNOSIS — F1220 Cannabis dependence, uncomplicated: Secondary | ICD-10-CM | POA: Insufficient documentation

## 2013-07-01 ENCOUNTER — Encounter (HOSPITAL_COMMUNITY): Payer: Self-pay | Admitting: Psychology

## 2013-07-01 NOTE — Progress Notes (Signed)
    Daily Group Progress Note  Program: CD-IOP   Group Time: 1-2:30 pm   Participation Level: Minimal  Behavioral Response: Sharing  Type of Therapy: Psycho-education Group  Topic: Guest Speaker: the first part of group was spent with a guest speaker. 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 She talked very little about her drug use, but emphasized the things she has done since getting out of treatment in [facility]. She described her daily recovery plan and encouraged group members to become as open as the can be and talk about their feelings. Questions were asked by group members and there was a good discussion on the vulnerabilities in early recovery.   Group Time: 2:45- 4 pm  Participation Level: Active  Behavioral Response: Appropriate and Sharing  Type of Therapy: Activity Group  Topic:Family Sculpture/Graduation: the second part of group was spent in the activity, "Family Sculpture". Members volunteered to 'sculpt' a scene from their early family life. We had done this in the previous session and the activity had generated considerable interest and a number of members had asked to do sculpt their families. This activity was ended early to allow time for a graduation. A member was completing the program successfully and he was being honored with a special coin, brownies, and tributes. Prior to the coin ceremony, the patient read a letter he had written to his father, who had died in 2023/01/26 of this year. It was a very moving tribute and brought tears to almost everyone. The session proved very powerful and emotional on many levels.    Summary: The patient was attentive and made some comments during the guest speaker's visit. He was attending both AA and NA, but could agree that NA really spoke to him more.  The patient stood in as a family member during the sculptures and made some good comments. He continues to make good  progress in his recovery and is actively engaged in the 12-step community. The patient invited the new member to meet him for a meeting tonight. His sobriety date remains 7/9.    Family Program: Family present? No   Name of family member(s):   UDS collected: No Results:   AA/NA attended?: YesMonday, Tuesday and Wednesday  Sponsor?: Yes   Conlin Brahm, LCAS

## 2013-07-02 ENCOUNTER — Other Ambulatory Visit (HOSPITAL_COMMUNITY): Payer: BC Managed Care – PPO | Attending: Psychiatry | Admitting: Psychology

## 2013-07-02 DIAGNOSIS — F3160 Bipolar disorder, current episode mixed, unspecified: Secondary | ICD-10-CM | POA: Insufficient documentation

## 2013-07-02 DIAGNOSIS — F1220 Cannabis dependence, uncomplicated: Secondary | ICD-10-CM | POA: Insufficient documentation

## 2013-07-02 DIAGNOSIS — F1110 Opioid abuse, uncomplicated: Secondary | ICD-10-CM | POA: Insufficient documentation

## 2013-07-04 ENCOUNTER — Encounter (HOSPITAL_COMMUNITY): Payer: Self-pay | Admitting: Psychology

## 2013-07-04 NOTE — Progress Notes (Signed)
    Daily Group Progress Note  Program: CD-IOP   Group Time: 1-2:30 pm  Participation Level: Active  Behavioral Response: Appropriate and Sharing  Type of Therapy: Process Group  Topic: Group Process: the first part of group was spent in process. Members shared bout current issues and concerns. The newest group member vented and disclosed many of his feelings felt in the past 45 hours or since he left this group session on Wednesday. During the time the medical director met with another new member. A brief psycho-ed piece was provided on the 4-piece relapse process. There was a good discussion and feedback on disclosures in this session.   Group Time: 2:45- 4pm  Participation Level: Active  Behavioral Response: Sharing  Type of Therapy: Activity Group  Topic: Family Sculpture: the second half of group was spent in an activity  that we first began on Monday. Since the first day, members have admitted being very interested in their own biological families and all wanted the opportunity to 'sculpt' their family. The session proved very revealing and as members talked and described the meanings behind these forms and it proved very therapeutic. As the session ended, members shared their plans for the upcoming weekend.   Summary: The patient was attentive and active in group. He displayed an excellent understanding of her recovery as he encouraged one of the newer members to focus on her self and stop trying to 'save' her friend. He and other members reminded her that nobody can save anyone else. This patient had attended an NA meetings last night and taken the newest member of this program with him.  He had introduced this new member to many of the others at the NA meeting. The patient engaged in the sculpture session and made some excellent observations. He continues to display great insight and is doing everything we have asked of him since he first came into the program. The patient's  sobriety date is 7/9.   Family Program: Family present? No   Name of family member(s):   UDS collected: No Results:   AA/NA attended?: YesWednesday, Thursday and Friday  Sponsor?: Yes   Sirena Riddle, LCAS

## 2013-07-05 ENCOUNTER — Other Ambulatory Visit (HOSPITAL_COMMUNITY): Payer: BC Managed Care – PPO | Attending: Psychiatry | Admitting: Psychology

## 2013-07-05 DIAGNOSIS — F3160 Bipolar disorder, current episode mixed, unspecified: Secondary | ICD-10-CM | POA: Insufficient documentation

## 2013-07-05 DIAGNOSIS — F1110 Opioid abuse, uncomplicated: Secondary | ICD-10-CM | POA: Insufficient documentation

## 2013-07-05 DIAGNOSIS — F1220 Cannabis dependence, uncomplicated: Secondary | ICD-10-CM | POA: Insufficient documentation

## 2013-07-06 ENCOUNTER — Encounter (HOSPITAL_COMMUNITY): Payer: Self-pay | Admitting: Psychology

## 2013-07-06 NOTE — Progress Notes (Signed)
    Daily Group Progress Note  Program: CD-IOP   Group Time: 1-2:30 pm  Participation Level: Active  Behavioral Response: Appropriate and Sharing  Type of Therapy: Process Group  Topic: Feelings: Why they are so Important. The second half of group was spent in a psycho-ed session on the topic of feelings. Members were provided a handout that identified why feelings are so important. Members were open about the struggles with feelings and how difficult they are in early recovery. They were asked to identify what they were feeling, but many still appear frustrated or uncertain whey they are feeling as they do. This will be an ongoing process they will deal with in early recovery.  Group Time: 2:45-4pm  Participation Level: Active  Behavioral Response: Sharing  Type of Therapy: Psycho-education Group  Topic: Feelings: Why they are so Important. The second half of group was spent in a psycho-ed session on the topic of feelings. Members were provided a handout that identified why feelings are so important. Members were open about the struggles with feelings and how difficult they are in early recovery. They were asked to identify what they were feeling, but many still appear frustrated or uncertain when they are feeling as they do. This will be an ongoing process they will deal with in early recovery.  Summary: The patient reported he had attended a number of meetings this weekend. He noted that he and the new group member had gone together and they had both introduced each other to people they had known or recently met. The patient reported his wife had not been sleeping and was depressed and he agreed that he was trying to get her in to see someone. The patient was able to identify a number of pros and cons to using and sobriety and displayed good insight. He provided good support to the newest member and encouraged another member to go to some meetings with he and some other group members.  Despite his suggestions, she made an excuse for everything. The patient continues to make good progress in his recovery and has attended 2-3 meetings per day and is really enjoying making friends in recovery. His sobriety date remains 7/9.   Family Program: Family present? No   Name of family member(s):   UDS collected: Yes Results: not back yet  AA/NA attended?: YesFriday, Saturday and Sunday  Sponsor?: Yes   Jessabelle Markiewicz, LCAS

## 2013-07-07 ENCOUNTER — Other Ambulatory Visit (HOSPITAL_COMMUNITY): Payer: BC Managed Care – PPO | Attending: Psychiatry | Admitting: Psychology

## 2013-07-07 DIAGNOSIS — F1110 Opioid abuse, uncomplicated: Secondary | ICD-10-CM | POA: Insufficient documentation

## 2013-07-07 DIAGNOSIS — F3160 Bipolar disorder, current episode mixed, unspecified: Secondary | ICD-10-CM | POA: Insufficient documentation

## 2013-07-07 DIAGNOSIS — F1220 Cannabis dependence, uncomplicated: Secondary | ICD-10-CM

## 2013-07-09 ENCOUNTER — Other Ambulatory Visit (HOSPITAL_COMMUNITY): Payer: BC Managed Care – PPO | Attending: Psychiatry | Admitting: Psychology

## 2013-07-09 DIAGNOSIS — F3160 Bipolar disorder, current episode mixed, unspecified: Secondary | ICD-10-CM | POA: Insufficient documentation

## 2013-07-09 DIAGNOSIS — F1110 Opioid abuse, uncomplicated: Secondary | ICD-10-CM | POA: Insufficient documentation

## 2013-07-09 DIAGNOSIS — F1220 Cannabis dependence, uncomplicated: Secondary | ICD-10-CM

## 2013-07-11 ENCOUNTER — Encounter (HOSPITAL_COMMUNITY): Payer: Self-pay | Admitting: Psychology

## 2013-07-11 NOTE — Progress Notes (Signed)
    Daily Group Progress Note  Program: CD-IOP   Group Time: 1-2:30 pm  Participation Level: Active  Behavioral Response: Appropriate and Sharing  Type of Therapy: Psycho-education Group  Topic: Visit with the Chaplain: after a brief check-in, the group welcomed a visit from [employer], the chaplain at the [facility]. After introductions, the chaplain invited a discussion about "Hope". She invited group members to share what they hope for, especially what they hope to gain from treatment. There was good disclosure among members. The chaplain challenged members to identify the difference between hoping and wishing? The importance of being "pro-active" or working towards hope was emphasized.     Group Time: 2:45- 4pm Participation Level: Active  Behavioral Response: Sharing  Type of Therapy: Process Group  Topic: Matrix/Introductions: a brief presentation utilizing the Matrix treatment program was presented in the first part of this half of group. A slide show was presented that highlighted the development through of addiction through reinforcement. The way triggers and cravings first develop and then grow in strength. Two new members were present today and they were invited to tell their new group members a little bit about themselves and what they hoped to get from the group. There was good feedback and support with members inviting the new members to join them this evening at some specific 12-step meetings.     Summary: the patient reported he is struggling with his grandmother. Hospice has told them they are going to seek a bed for his grandmother at [facility], this is the home where the terminally ill frequently go to spend out their last days. He admitted he is very fearful of the unknown, but also admitted to the chaplain that he hopes this program helps him to experience the hope he has for 'change'.  Despite the discomfort and uncertainty, this patient understands  that change will come and he wants his life to look differently than it does now. In the second part of group, the patient displayed good insight into triggers and conditioned responses. He was very encouraging to the new group members and invited them to meet him at certain meetings in the evenings, beginning tonight. He made some good comments and continues to be a real leader in group. His sobriety date remains 7/9.    Family Program: Family present? No   Name of family member(s):   UDS collected: Yes Results: negative  AA/NA attended?: YesMonday and Tuesday  Sponsor?: Yes   Samanvi Cuccia, LCAS

## 2013-07-12 ENCOUNTER — Encounter (HOSPITAL_COMMUNITY): Payer: Self-pay | Admitting: Psychology

## 2013-07-12 ENCOUNTER — Other Ambulatory Visit (HOSPITAL_COMMUNITY): Payer: BC Managed Care – PPO | Attending: Psychiatry | Admitting: Psychology

## 2013-07-12 DIAGNOSIS — F1220 Cannabis dependence, uncomplicated: Secondary | ICD-10-CM

## 2013-07-12 DIAGNOSIS — F1110 Opioid abuse, uncomplicated: Secondary | ICD-10-CM | POA: Insufficient documentation

## 2013-07-12 DIAGNOSIS — F3160 Bipolar disorder, current episode mixed, unspecified: Secondary | ICD-10-CM | POA: Insufficient documentation

## 2013-07-12 NOTE — Progress Notes (Signed)
    Daily Group Progress Note  Program: CD-IOP   Group Time: 1-2:30 pm  Participation Level: Active  Behavioral Response: Appropriate and Sharing  Type of Therapy: Process Group  Topic: The patient reported she has been busy with meetings and her requirements at the [facility], but she stated she is doing well. She laughed as she admitted that the roommate she hadn't liked at first is now her best friend. The patient reported she had attended a really good women's meeting and after disclosing that her sponsor was a man, she received many phone numbers from the women in attendance. The patient's disclosures tend to wander and be excessive and today I asked her to get to the point on numerous occasions. The patient has poor insight and seems stuck in her dysfunctional mentality.  Group Time: 2:45-4pm  Participation Level: Active  Behavioral Response: Sharing  Type of Therapy: Psycho-education Group  Topic: "What are you willing to change?" The second part of group was focused on the addressing the 'conditioning' that addiction develops from and how members can change it. They were asked what they would be willing to do differently. While some easily offered the changes they have made to date, others seemed resistant to identifying any changes they would commit to. As the session neared the end, a graduation ceremony was held with members honoring the graduating member with kind and hopeful words offered him.  Summary:The patient was very quiet in the session until I finally asked him what he was feeling. Tony Davila shared that he is really struggling. Hospice has informed him and his wife that they are preparing a bed for his grandmother at [facility]. He admitted he is feeling very sad and knows that everything is going to change in the days ahead. The group provided good feedback and support, but the patient was upset and distracted and seemed unable to hear them. The patient also admitted  that his wife might be pregnant. When questioned whether she had taken a test, he admitted she had not agreed to this. He offered the options they are looking at, including moving to [place] where he has family that is healthy and loving. Here in [place], the patient has very addicted and dysfunctional parents. The parent continued to make good comments despite his struggles with impending loss and further grief in his life. His sobriety date remains 7/9.    Family Program: Family present? No   Name of family member(s):   UDS collected: No Results:   AA/NA attended?: YesWednesday and Thursday  Sponsor?: Yes   Buffy Ehler, LCAS

## 2013-07-13 ENCOUNTER — Encounter (HOSPITAL_COMMUNITY): Payer: Self-pay | Admitting: Psychology

## 2013-07-13 NOTE — Progress Notes (Unsigned)
Patient ID: Tony Davila, male   DOB: Jul 27, 1981, 32 y.o.   MRN: [id number] CD-IOP: Treatment Plan Update;  Met with the patient this morning to discuss his progress in treatment and review his goals. He was scheduled to meet along with his wife, Tony Davila, but she is ill and was unable to attend this morning. She has been vomiting all night and felt badly over the past two days. The patient reported he had scheduled an appointment for his wife to see their PCP, Tony Kocher, MD at [facility] this afternoon. He stated that she had taken a pregnancy test, but it came out negative. He noted her period is very late and she has a history of low level hormones, he wondered if perhaps she could be pregnant, but the test not detailed enough to reveal it?  He agreed to contact me after they get out of the appointment later today. We discussed his progress in treatment and I noted that he had done everything we have asked of him. He has remained sober and immersed himself in the 12-step community. Tony Davila has made many new friends and seems to be really blossoming in these new relationships. I congratulated him on his insight based on his disclosures and feedback in the group. He is quick to call people out, but is very honest and demanding of himself. His third treatment goal is to return to work and he plans on going back at the conclusion of this program. The patient is also experiencing more emotional stability, but admitted he is still getting angry about things. He reported he will wait until the medical director returns and then meet to discuss a change or increase in medication. The patient is currently prescribed Seroquel, 100 mg at bedtime. When I questioned his disclosure that he made in group yesterday about his mother stealing his Seroquel, the patient noted that his wife had been at the apartment when his mother came by and he was not there. I repeated the importance of having his mediations in a safe  secure place and the fact that his mother is an active addict and she shouldn't be allowed in their home. He assured me she would not be allowed in any longer. I also reminded the patient that there are a lot of emotions that he can anticipate experiencing now that his grandmother has been moved into [employer]. He and his wife have been her primary caregivers for the past 18 months, but she was moved there yesterday because she is deteriorating and they can no longer meet all of her medical needs. He also has to deal with what he and his wife will do if and when she passes. They have no other obligations to stay here in [place] and he has mentioned more than once some healthy supportive relatives in [place] and [place]. The patient agreed that he and maybe even his wife more so will need some counseling. The documentation was reviewed, signed and the treatment plan update completed accordingly.

## 2013-07-14 ENCOUNTER — Encounter (HOSPITAL_COMMUNITY): Payer: Self-pay | Admitting: Psychology

## 2013-07-14 ENCOUNTER — Other Ambulatory Visit (HOSPITAL_COMMUNITY): Payer: BC Managed Care – PPO | Attending: Psychiatry | Admitting: Psychology

## 2013-07-14 DIAGNOSIS — F1220 Cannabis dependence, uncomplicated: Secondary | ICD-10-CM | POA: Insufficient documentation

## 2013-07-14 DIAGNOSIS — F1110 Opioid abuse, uncomplicated: Secondary | ICD-10-CM | POA: Insufficient documentation

## 2013-07-14 DIAGNOSIS — F3160 Bipolar disorder, current episode mixed, unspecified: Secondary | ICD-10-CM | POA: Insufficient documentation

## 2013-07-14 NOTE — Progress Notes (Signed)
    Daily Group Progress Note  Program: CD-IOP   Group Time: 1-2:30 pm  Participation Level: Active  Behavioral Response: Sharing and frustrated  Type of Therapy: Process Group  Topic: Group Process: the first part of group was spent in process. Two students from the [place] program at [facility] were also in the group. Members checked-in and described what they had done over the weekend that supported their recovery. Many had attended a number of meetings. One member expressed her need to share what she was feeling from the session on Friday. She and I had had a confrontation of sorts after I requested that she put away her cell phone. In the 7+ sessions she has attended, it is safe to say that she has been asked 6-7 times to put away her phone. The patient expressed her anger over this incident and admitted she had thought about it over and over through the course of the weekend. The remainder of the session was spent with the group processing their experience on Friday and their feelings today. The session proved very intense and, ultimately, therapeutic for all of the members.    Group Time: 2:45-4pm  Participation Level: Active  Behavioral Response: Sharing  Type of Therapy: Psycho-education Group  Topic:"The Wheel of Life": the second half of group was spent in a psycho-ed session . Members were provided a handout entitled, "The Wheel of Life". they were asked to chart where they were relative to the eight different sections. The session invited members to share about the balance in their lives and what they might consider doing to make changes. members presented their wheels on the board and explained why they had charted them this way. Group members provided good feedback and the session proved effective and therapeutic.    Summary: The patient reported he was very angry. He reported his mother had come by the house when he was gone and had taken his Seroquel. She had stopped in to  visit with her mother, with whom they are caring for. Apparently, she took his bottle and he was very angry with her. He reminded the group that his mother is an addict and she uses people and doesn't care about anybody but herself. The patient was extremely angry and the group allowed him to vent his feelings. The patient provided good feedback and provided support to his fellow group members. He drew his wheel and agreed he hasn't had any fun or recreation in a long time. The patient was encouraged to keep his medications in a safe place and certainly not out in the open where they can be seen. He made an excuse about his wife not thinking of this, but she is very aware of his recovery needs and it seems unlikely she was careless like he suggested. The patient has made good progress otherwise and is active and engaged in the Fellowship. His sobriety date is 7/9.  Family Program: Family present? No   Name of family member(s):   UDS collected: No Results:  AA/NA attended?: YesFriday, Saturday and Sunday  Sponsor?: Yes   Drevion Offord, LCAS

## 2013-07-16 ENCOUNTER — Other Ambulatory Visit (HOSPITAL_COMMUNITY): Payer: Self-pay

## 2013-07-18 ENCOUNTER — Encounter (HOSPITAL_COMMUNITY): Payer: Self-pay | Admitting: Psychology

## 2013-07-18 NOTE — Progress Notes (Signed)
    Daily Group Progress Note  Program: CD-IOP   Group Time: 1-2:30 pm  Participation Level: Active  Behavioral Response: Appropriate and Sharing  Type of Therapy: Psycho-education Group  Topic: Stages of Change: After completing a check-in, a psycho-ed piece was presented on "The Stages of Change'. Members were provided a handout that identified the stages of change. A brief explanation of the stages was provided and the importance of moving back and forth as well as the time frame of change emphasized. Members were invited to identify where they felt they were in the stages of change. They were also encouraged to identify what they would do if they fell out or relapsed and how they return to the stage they needed to be. The session proved very effective and members responded well to this concept of change as a process.   Group Time: 2:45- 4pm  Participation Level: Active  Behavioral Response: Sharing  Type of Therapy: Process Group  Topic: Group Process: The second part of group was spent in process. Members shared about current issues and concerns. There was good feedback and discussion and members seemed open and willing to discuss what they were struggling with at this time. The session ended with a tension-breaker and an opportunity to learn more about each other and the unknown strengths, talents, and experiences each of the group member brings to the table. This session provided good insight, encouraged openness that proved very safe, and there was good feedback and validation among group members.   Summary: The patient reported his wife is sick and she is thinking she might be pregnant. The group urged him to get her to a doctor. The patient agreed that he had phoned and got her an appointment later today. He described himself as being in the 'action' phase of change and it feels good. He has a sponsor and is going to begin working the steps. He has made a lot of changes, has  attended many meetings since he first began the program and has made a lot of new friends in recovery. The patient reported his grandmother seems to be improving dramatically and may be sent home next week. She was transferred from their apartment to [facility] last week. She is much more cogent now since she is off some of the pain meds. He reported he will attend a meeting tonight and his sobriety date remains 7/9.    Family Program: Family present? No   Name of family member(s):   UDS collected: No Results:   AA/NA attended?: YesMonday and Tuesday  Sponsor?: Yes   Oday Ridings, LCAS

## 2013-07-19 ENCOUNTER — Other Ambulatory Visit (HOSPITAL_COMMUNITY): Payer: BC Managed Care – PPO | Attending: Psychiatry | Admitting: Psychology

## 2013-07-19 ENCOUNTER — Other Ambulatory Visit (HOSPITAL_COMMUNITY): Payer: Self-pay | Admitting: Physician Assistant

## 2013-07-19 DIAGNOSIS — F1110 Opioid abuse, uncomplicated: Secondary | ICD-10-CM | POA: Insufficient documentation

## 2013-07-19 DIAGNOSIS — F3160 Bipolar disorder, current episode mixed, unspecified: Secondary | ICD-10-CM | POA: Insufficient documentation

## 2013-07-19 DIAGNOSIS — F1220 Cannabis dependence, uncomplicated: Secondary | ICD-10-CM

## 2013-07-20 ENCOUNTER — Encounter (HOSPITAL_COMMUNITY): Payer: Self-pay | Admitting: Psychology

## 2013-07-20 NOTE — Progress Notes (Signed)
    Daily Group Progress Note  Program: CD-IOP   Group Time: 1-2:30 pm  Participation Level: Active  Behavioral Response: Sharing  Type of Therapy: Process Group  Topic: Group Process: the first part of group was spent in process members shared about their days in early recovery since the last group session. Group had been cancelled on Friday due to energy problems. Two new members were also present and they received a warm welcome. A number of members shared about putting themselves in high risk situations and those were processed by members. Halfway through this session, because of continued problems with the [facility], group moved out into the courtyard under the covered shelter. It was cooler and there was a delightful breeze. The remainder of this session was spent outside.   Group Time: 2:45-4 pm  Participation Level: Active  Behavioral Response: Appropriate and Sharing  Type of Therapy: Psycho-education Group  Topic: Dopamine Deficiency/Graduation: the second half of group was spent in a review of the dopamine deficit identified within the brains of addicts and alcoholics. Addiction develops out of the search to find whatever makes that individual feel whole again. This is most easily experienced through drugs and alcohol. Members shared the struggles they have had and many expressed frustration over fellow group members and their willingness to put themselves in high risk situations. As the session neared an end, a graduation was held for a member who was leaving the program having completed successfully. Kind words and plenty of hope were shared and the graduating member expressed his appreciation to his fellow group members and encouraged them to take advantage of this experience.     Summary: The patient reported he had attended a lot of meetings over the weekend. He expressed frustration and explained that his grandmother may be discharged from [facility] next week. I challenged  him to consider refusing to provide further care and getting his own place. He admitted it is very stressful, but noted that his uncle had told him he would explore her social security payments. Another member encouraged him to focus on himself and his wife and take care of himself. I reminded the patient that it is as if he and his wife allow themselves to be victims. He agreed with another member about triggers and how he has to avoid certain parts of town. The patient had kind words for the graduating member and continues to make good observations and display surprising insight. His sobriety date remains 7/9.    Family Program: Family present? No   Name of family member(s):   UDS collected: Yes Results: not returned from lab  AA/NA attended?: YesFriday, Saturday and Sunday  Sponsor?: Yes   Augie Vane, LCAS

## 2013-07-21 ENCOUNTER — Other Ambulatory Visit (HOSPITAL_COMMUNITY): Payer: BC Managed Care – PPO | Attending: Psychiatry | Admitting: Psychology

## 2013-07-21 DIAGNOSIS — F1110 Opioid abuse, uncomplicated: Secondary | ICD-10-CM | POA: Insufficient documentation

## 2013-07-21 DIAGNOSIS — F1220 Cannabis dependence, uncomplicated: Secondary | ICD-10-CM | POA: Insufficient documentation

## 2013-07-21 DIAGNOSIS — F3160 Bipolar disorder, current episode mixed, unspecified: Secondary | ICD-10-CM | POA: Insufficient documentation

## 2013-07-23 ENCOUNTER — Other Ambulatory Visit (HOSPITAL_COMMUNITY): Payer: Self-pay

## 2013-07-26 ENCOUNTER — Encounter (HOSPITAL_COMMUNITY): Payer: Self-pay | Admitting: Psychology

## 2013-07-26 NOTE — Progress Notes (Signed)
    Daily Group Progress Note  Program: CD-IOP   Group Time: 1-2:30 pm  Participation Level: Active  Behavioral Response: Appropriate and Sharing  Type of Therapy: Psycho-education Group  Topic: Codependency: The first part of group was spent in a presentation about Codependency. A guest facilitator, Cameron Ali, was the presenter. She provided handouts and the group completed a questionnaire. Members were invited to volunteer some of their answers to the questions. A good discussion ensued and members provided good feedback about their own experiences in relationships and some of the dysfunctional behaviors that they had engaged in during those relationships. The significance of parents or grandparents and how their addictions have impacted these group members was also discussed at length. The discussion was compelling with honest sharing among the group.  Group Time: 2:45-4pm  Participation Level: Active  Behavioral Response: Sharing  Type of Therapy: Process Group  Topic: Group process: the second half of group was spent in process. Members shared about current issues and concerns in early recovery. The struggles so common, especially PAWS, were discussed and the new group members reminded they will meet with the medical director on Friday. As the group concluded, members identified what meetings they intended to go to this evening and they provided good suggestions to one of the new members who is going to his first meeting tonight.   Summary:The patient reported he is doing pretty well. He and his wife are still trying to figure out what to do about their living situation and his grandmother. She is being sent back home, but they both agree that the 24/7 caretaking has damaged them both in many ways and even hurt their marriage. The patient agreed that not being honest and chaotic arguments over what no one knew were not uncommon in his life in the past. The patient reported he had  a phone interview tomorrow and was excited. He is nearing his graduation date and is pretty desperate about securing employment and a paycheck. His sobriety date remains 7/9.    Family Program: Family present? No   Name of family member(s):   UDS collected: No Results: But UA collected on Monday was negative  AA/NA attended?: [place], Tuesday and Wednesday  Sponsor?: Yes   Rebbie Lauricella, LCAS

## 2013-07-28 ENCOUNTER — Other Ambulatory Visit (HOSPITAL_COMMUNITY): Payer: BC Managed Care – PPO | Attending: Psychiatry | Admitting: Psychology

## 2013-07-28 DIAGNOSIS — F1220 Cannabis dependence, uncomplicated: Secondary | ICD-10-CM

## 2013-07-28 DIAGNOSIS — F3160 Bipolar disorder, current episode mixed, unspecified: Secondary | ICD-10-CM | POA: Insufficient documentation

## 2013-07-28 NOTE — Progress Notes (Signed)
    Daily Group Progress Note  Program: CD-IOP   Group Time: 1:00-2:30 pm  Participation Level: Active  Behavioral Response: Appropriate  Type of Therapy: Process Group  Topic: Pt reports his sobriety date as 06/02/13. He appeared active in his recovery and talked about how he is determined to work the recovery program and remain sober. Pt is questioning if his sponsor is supportive enough to help him work the steps due to his limited time availability and Pt is going to explore that. Pt compared his life today with how it was when he was using and said he feels "happier" sober. Pt acted in a leadership role within the group by checking in with other members and inviting them to share.      Group Time: 2:30-4:00 pm  Participation Level: Active  Behavioral Response: Appropriate  Type of Therapy: Psycho-education Group  Topic: Boundaries (Part I)   Summary: Pt was introduced to Boundary setting as a way to manage wellness and prevent addiction relapse. Pt learned about the four personality styles that impact ones ability to set healthy boundaries and learned which one they currently identify with.    Family Program: Family present? No   Name of family member(s): NA  UDS collected: No Results: NA  AA/NA attended?: YesTuesday  Sponsor?: Yes   EVANS,ANN, LCAS

## 2013-07-30 ENCOUNTER — Other Ambulatory Visit (HOSPITAL_COMMUNITY): Payer: BC Managed Care – PPO | Attending: Psychiatry | Admitting: Psychology

## 2013-07-30 DIAGNOSIS — F1110 Opioid abuse, uncomplicated: Secondary | ICD-10-CM | POA: Insufficient documentation

## 2013-07-30 DIAGNOSIS — F1220 Cannabis dependence, uncomplicated: Secondary | ICD-10-CM | POA: Insufficient documentation

## 2013-07-30 DIAGNOSIS — F3160 Bipolar disorder, current episode mixed, unspecified: Secondary | ICD-10-CM | POA: Insufficient documentation

## 2013-07-30 NOTE — Progress Notes (Signed)
    Daily Group Progress Note  Program: CD-IOP   Group Time: 1:00-2:30 pm  Participation Level: Active  Behavioral Response: Appropriate  Type of Therapy: Process Group  Topic: Pt reports his sobriety date as 06/02/13. He ended the program today and talked about the progress he has made working his recovery program and talked about getting a job with [employer] hospital and how positive his life has been living it sober. He received support from others and said goodbye.      Group Time: 2:30-4:30 pm  Participation Level: Active  Behavioral Response: Appropriate  Type of Therapy: Psycho-education Group  Topic: Boundaries Part 2   Summary: Pt learned the five steps to setting healthy boundaries using personal life examples and identified areas of change they need to make to set better limits to ensure sobriety.      AA/NA attended?: YesThursday  Sponsor?: Yes   EVANS,ANN, LCAS

## 2013-08-02 ENCOUNTER — Telehealth: Payer: Self-pay | Admitting: Internal Medicine

## 2013-08-02 ENCOUNTER — Encounter: Payer: Self-pay | Admitting: Gastroenterology

## 2013-08-02 ENCOUNTER — Ambulatory Visit (INDEPENDENT_AMBULATORY_CARE_PROVIDER_SITE_OTHER): Payer: BC Managed Care – PPO | Admitting: Gastroenterology

## 2013-08-02 VITALS — BP 110/60 | HR 80 | Ht 66.0 in | Wt 155.0 lb

## 2013-08-02 DIAGNOSIS — R109 Unspecified abdominal pain: Secondary | ICD-10-CM

## 2013-08-02 DIAGNOSIS — Z87898 Personal history of other specified conditions: Secondary | ICD-10-CM

## 2013-08-02 MED ORDER — OMEPRAZOLE 40 MG PO CPDR: 40.0000 mg | DELAYED_RELEASE_CAPSULE | Freq: Two times a day (BID) | ORAL | Status: DC

## 2013-08-02 MED ORDER — SUCRALFATE 1 GM/10ML PO SUSP: 1.0000 g | Freq: Three times a day (TID) | ORAL | Status: DC

## 2013-08-02 NOTE — Patient Instructions (Addendum)
You have been scheduled for an endoscopy with propofol. Please follow written instructions given to you at your visit today. If you use inhalers (even only as needed), please bring them with you on the day of your procedure. Your physician has requested that you go to www.startemmi.com and enter the access code given to you at your visit today. This web site gives a general overview about your procedure. However, you should still follow specific instructions given to you by our office regarding your preparation for the procedure. We have sent the following medications to your pharmacy for you to pick up at your convenience: Omeprazole and Carafate CC:  Evelena Peat MD

## 2013-08-02 NOTE — Progress Notes (Signed)
08/02/2013 CAID RADIN [id number] February 08, 1981   History of Present Illness: Tony Davila is a 32 year old white male known to Dr. Leone Payor , who is status post Roux-en-Y gastric bypass in 2009. He lost from a total of 376 pounds To current of 155 pounds. Unfortunately his postop course has been complicated by recurrent ulcer disease. He states that he had an ulcer about 6 months after his surgery, then had a perforated gastrojejunal ulcer in February 2013 for which he underwent laparoscopic oversew per Dr. Ezzard Standing. He had presented to Dr. Leone Payor in May of 2014 with complaints of 2-3 weeks of left upper quadrant pain which was reminiscent of his prior ulcer symptoms. He underwent upper endoscopy on 04/12/2013 and was found to have an 8 x 8 mm anastomotic ulcer on the jejunal side. Biopsies were taken and this shows chronically inflamed and focally ulcerated small bowel mucosa.  He was placed on a combination of Prilosec 40 mg twice daily, Carafate 1 g 4 times daily and Cytotec 200 mcg 4 times daily. He had also been given hydrocodone to use as needed for pain.  He returned for follow-up EGD to document healing on 05/31/2013.  Ulcer had healed but there was scarring in the jejunum.  He was supposed to follow-up with surgery later that month, but never did so because he was feeling better.  Cytotec had to be discontinued because of headaches.  Comes in today with complaints of LUQ abdominal pain, episodic nausea/vomiting, and bloating symptoms (all reminiscent of his previous ulcers) since Wednesday or Thursday last week.  He has an appointment scheduled with the surgeons this Friday 9/12 and is seeing Dr. Luisa Hart.  He is still taking the prilosec twice daily and carafate suspension four times a day.   Current Medications, Allergies, Past Medical History, Past Surgical History, Family History and Social History were reviewed in [employer] record.   Physical Exam: BP 110/60  Pulse 80  Ht  5\' 6"  (1.676 m)  Wt 155 lb (70.308 kg)  BMI 25.03 kg/m2 General: Well developed, white male in no acute distress Head: Normocephalic and atraumatic Eyes:  sclerae anicteric, conjunctiva pink  Ears: Normal auditory acuity Lungs: Clear throughout to auscultation Heart: Regular rate and rhythm Abdomen: Soft, non-distended.  BS present.  Tenderness in the LUQ.  Incisional scars noted as well as excessive loose skin folds.   Musculoskeletal: Symmetrical with no gross deformities  Extremities: No edema  Neurological: Alert oriented x 4, grossly nonfocal Psychological:  Alert and cooperative. Normal mood and affect  Assessment and Recommendations: #23 32 year old white male status post Roux-en-Y gastric bypass for obesity in 2009. Patient has had 3 separate occurrences of anastomotic ulcers since that time and had a perforated gastrojejunal ulcer in February 2013 . Now with documented anastomotic ulcer May 2014.  Repeat EGD in 05/2013 showed healed ulcer but scarring in jejunum.  Patient never followed up with surgeons and now has recurrent symptoms.  *Continue BID prilosec and carafate suspension four times a day; refills given.   *Will schedule repeat EGD for next week with Dr. Leone Payor tentatively, but we will see what the surgeons have to say at his visit with them on Friday this week.

## 2013-08-02 NOTE — Telephone Encounter (Signed)
Patient having abdominal pain and vomiting since last Wednesday.  He has a history of ulcer and gastric bypass.  He is taking his omeprazole and carafate.  He will come in today and see Doug Sou, PA at 2:00

## 2013-08-03 ENCOUNTER — Encounter (HOSPITAL_COMMUNITY): Payer: Self-pay | Admitting: Psychology

## 2013-08-03 ENCOUNTER — Encounter: Payer: Self-pay | Admitting: Internal Medicine

## 2013-08-04 NOTE — Progress Notes (Signed)
Agree w/ Ms. Zehr's management. 

## 2013-08-06 ENCOUNTER — Encounter (INDEPENDENT_AMBULATORY_CARE_PROVIDER_SITE_OTHER): Payer: Self-pay | Admitting: Surgery

## 2013-08-06 ENCOUNTER — Ambulatory Visit (INDEPENDENT_AMBULATORY_CARE_PROVIDER_SITE_OTHER): Payer: BC Managed Care – PPO | Admitting: Surgery

## 2013-08-06 VITALS — BP 118/64 | HR 68 | Temp 98.7°F | Resp 14 | Ht 67.0 in | Wt 158.0 lb

## 2013-08-06 DIAGNOSIS — Z9884 Bariatric surgery status: Secondary | ICD-10-CM

## 2013-08-06 DIAGNOSIS — K289 Gastrojejunal ulcer, unspecified as acute or chronic, without hemorrhage or perforation: Secondary | ICD-10-CM

## 2013-08-06 NOTE — Progress Notes (Signed)
Patient has today for followup of ulcer at gastrojejunostomy secondary to gastric bypass surgery for weight loss. He is supposed to follow up with Dr.hoxworth.  He was mistakenly placed in my office today. Having chronic epigastric pain for a number of months. This is not a new problem. Endoscopy in July 2014 shows minimal ulceration at his gastrojejunostomy site. History of perforation in 2012 status post laparoscopy by Dr. Ezzard Standing with a Graham's patch. His weight is 160 pounds. He has chronic epigastric pain. He is in no distress. His examination is benign today in the office of his abdomen. We'll make a point for patient to see Dr. 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 He is smoking and have asked him to stop. He is also trying to cut drug use as well. Explained that smoking will lead to marginal ulcers and he states "I can only stop one thing at a time".

## 2013-08-06 NOTE — Patient Instructions (Addendum)
Will refer to Dr Johna Sheriff next week.

## 2013-08-07 NOTE — Progress Notes (Unsigned)
Patient ID: Tony Davila, male   DOB: 02-06-1981, 32 y.o.   MRN: [id number] Discharge from CD-IOP: I met with this patient today to complete his discharge plan and summary. He had officially completed the program last Friday, September 5th, but I was out of town and asked him to meet with me today to complete all of his paperwork. The documentation was reviewed, signatures secured, and his discharge finalized. The patient successfully completed our program and has all of the tools he needs to remain drug-free going forward. Please see documentation scanned in Epic.

## 2013-08-09 ENCOUNTER — Telehealth (INDEPENDENT_AMBULATORY_CARE_PROVIDER_SITE_OTHER): Payer: Self-pay

## 2013-08-09 NOTE — Progress Notes (Addendum)
  [facility] Chemical Dependency Intensive Outpatient Discharge Summary   Tony Davila [id number]  Date of Admission: 06/02/2013 Date of Discharge: 07/30/2013  Course of Treatment: Patient was admitted to the chemical dependency intensive outpatient program, and he was started on Seroquel 25-50 mg at bedtime to address his insomnia, anxiety, and irritability. He quickly got involved in attending 12 step meetings as we requested. He proved to be a strong participant in the group discussions. His Seroquel was adjusted to 100 mg at bedtime, which he reported was an effective dose. At his discharge visit he reported he was attending 2-3 12 step meetings daily, and had obtained a sponsor who was working with him to work the 12 steps. He continued to endorse cravings for opiates, but uses his support network when he experiences those. He reports that his mood is good overall, but has periods of anxiety and irritability. He reports that he received much benefit from the program, and is now more able to open up and discuss his problems, and that he is less isolative. He is making new friends and increasing his support network. 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 He denies any suicidal or homicidal thoughts.   Goals and Activities to Help Maintain Sobriety: 1. Stay away from old friends who continue to drink and use mind-altering chemicals. 2. Continue practicing Fair Fighting rules in interpersonal conflicts. 3. Continue alcohol and drug refusal skills and call on support systems. 4. Continue to attend 12-step meetings regularly 5. Work with a sponsor in recovery to work the 12 steps 6. Practiced the principles learned through working the 12 steps in her everyday life  Referrals: [facility] for medication management and continued one-to-one therapy   Aftercare services:  1. Attend AA/NA meetings 7 times per week. 2. Obtain a sponsor and a home  group in AA/NA. 3. Attended all appointments as scheduled 4. Take all medications as prescribed  Next appointment:   Client has participated in the development of this discharge plan and has received a copy of this completed plan    Jasmene Goswami  08/09/2013   EVANS,ANN, LCAS 08/09/2013

## 2013-08-09 NOTE — Telephone Encounter (Signed)
Called and reschedule patient to later time on 08/11/13 with Dr. Johna Sheriff.  Patient originally scheduled for 9:30 appt but times have been overbooked.  Patient okay with changing arrival time to 4:30 for 4:45 pm appt on 08/11/13 w/Dr. Johna Sheriff.

## 2013-08-10 ENCOUNTER — Ambulatory Visit (AMBULATORY_SURGERY_CENTER): Payer: BC Managed Care – PPO | Admitting: Internal Medicine

## 2013-08-10 ENCOUNTER — Encounter: Payer: Self-pay | Admitting: Internal Medicine

## 2013-08-10 VITALS — BP 120/68 | HR 66 | Temp 97.6°F | Resp 17 | Ht 66.0 in | Wt 155.0 lb

## 2013-08-10 DIAGNOSIS — R109 Unspecified abdominal pain: Secondary | ICD-10-CM

## 2013-08-10 DIAGNOSIS — K287 Chronic gastrojejunal ulcer without hemorrhage or perforation: Secondary | ICD-10-CM

## 2013-08-10 HISTORY — PX: ESOPHAGOGASTRODUODENOSCOPY: SHX1529

## 2013-08-10 MED ORDER — SODIUM CHLORIDE 0.9 % IV SOLN: 500.0000 mL | INTRAVENOUS | Status: DC

## 2013-08-10 NOTE — Progress Notes (Signed)
Report to pacu rn, vss, bbs=clear 

## 2013-08-10 NOTE — Op Note (Signed)
[facility] [address] [place], [address]   ENDOSCOPY PROCEDURE REPORT  PATIENT: Tony Davila, Tony Davila  MR#: [id number] BIRTHDATE: 1981-08-12 , 32  yrs. old GENDER: Male ENDOSCOPIST: Iva Boop, MD, [facility] PROCEDURE DATE:  08/10/2013 PROCEDURE:  EGD, diagnostic ASA CLASS:     Class II INDICATIONS:  abdominal pain in upper left quadrant.  Recurrent anastomotic ulcers MEDICATIONS: propofol (Diprivan) 100mg  IV, MAC sedation, administered by CRNA, and These medications were titrated to patient response per physician's verbal order TOPICAL ANESTHETIC: Cetacaine Spray  DESCRIPTION OF PROCEDURE: After the risks benefits and alternatives of the procedure were thoroughly explained, informed consent was obtained.  The LB [id number] endoscope was introduced through the mouth and advanced to the proximal jejunum. Without limitations.  The instrument was slowly withdrawn as the mucosa was fully examined.        JEJUNUM: A medium sized round and clean-based ulcer was found in the jejunum at anastomosis. He is s/p gastric bypass and then oversew of gastrojejunal; ulcer (2013).  The remainder of the upper endoscopy exam was otherwise normal, s/p gastrojejunal bypass, and oversew. Two limbs of small bowel seen and examined. The GE junction was at approx 35 cm from incisors and the gastrojejunal anastromosis at 45 cm.  Retroflexed views revealed no abnormalities.     The scope was then withdrawn from the patient and the procedure completed.  COMPLICATIONS: There were no complications. ENDOSCOPIC IMPRESSION: 1.   Medium sized ulcer was found in the jejunum at the gastrojejunal anastomosis, s/p gastric bypass and then oversew of perforated ulcer 2.   The remainder of the upper endoscopy exam was otherwise normal  RECOMMENDATIONS: await Dr.  Jamse Mead input    eSigned:  Iva Boop, MD, [facility] 08/10/2013 3:36 PM   CC:The Patient and Glenna Fellows,  MD

## 2013-08-10 NOTE — Progress Notes (Addendum)
Patient did not have preoperative order for IV antibiotic SSI prophylaxis. [id number]  Patient stated that "about 3 months ago, I was really high.  It's a lot stronger than yours."  Patient did not experience any of the following events: a burn prior to discharge; a fall within the facility; wrong site/side/patient/procedure/implant event; or a hospital transfer or hospital admission upon discharge from the facility. [phone])

## 2013-08-10 NOTE — Patient Instructions (Addendum)
There is a persistent ulcer at the anastomosis. We will see what Dr. Johna Sheriff has to say.  I appreciate the opportunity to care for you. Iva Boop, MD, FACG   YOU HAD AN ENDOSCOPIC PROCEDURE TODAY AT THE [facility]: Refer to the procedure report that was given to you for any specific questions about what was found during the examination.  If the procedure report does not answer your questions, please call your gastroenterologist to clarify.  If you requested that your care partner not be given the details of your procedure findings, then the procedure report has been included in a sealed envelope for you to review at your convenience later.  YOU SHOULD EXPECT: Some feelings of bloating in the abdomen. Passage of more gas than usual.  Walking can help get rid of the air that was put into your GI tract during the procedure and reduce the bloating. If you had a lower endoscopy (such as a colonoscopy or flexible sigmoidoscopy) you may notice spotting of blood in your stool or on the toilet paper. If you underwent a bowel prep for your procedure, then you may not have a normal bowel movement for a few days.  DIET: Your first meal following the procedure should be a light meal and then it is ok to progress to your normal diet.  A half-sandwich or bowl of soup is an example of a good first meal.  Heavy or fried foods are harder to digest and may make you feel nauseous or bloated.  Likewise meals heavy in dairy and vegetables can cause extra gas to form and this can also increase the bloating.  Drink plenty of fluids but you should avoid alcoholic beverages for 24 hours.  ACTIVITY: Your care partner should take you home directly after the procedure.  You should plan to take it easy, moving slowly for the rest of the day.  You can resume normal activity the day after the procedure however you should NOT DRIVE or use heavy machinery for 24 hours (because of the sedation medicines used during  the test).    SYMPTOMS TO REPORT IMMEDIATELY: A gastroenterologist can be reached at any hour.  During normal business hours, 8:30 AM to 5:00 PM Monday through Friday, call [phone].  After hours and on weekends, please call the GI answering service at [phone] who will take a message and have the physician on call contact you.   Following upper endoscopy (EGD)  Vomiting of blood or coffee ground material  New chest pain or pain under the shoulder blades  Painful or persistently difficult swallowing  New shortness of breath  Fever of 100F or higher  Black, tarry-looking stools  FOLLOW UP: If any biopsies were taken you will be contacted by phone or by letter within the next 1-3 weeks.  Call your gastroenterologist if you have not heard about the biopsies in 3 weeks.  Our staff will call the home number listed on your records the next business day following your procedure to check on you and address any questions or concerns that you may have at that time regarding the information given to you following your procedure. This is a courtesy call and so if there is no answer at the home number and we have not heard from you through the emergency physician on call, we will assume that you have returned to your regular daily activities without incident.  SIGNATURES/CONFIDENTIALITY: You and/or your care partner have signed paperwork which will  be entered into your electronic medical record.  These signatures attest to the fact that that the information above on your After Visit Summary has been reviewed and is understood.  Full responsibility of the confidentiality of this discharge information lies with you and/or your care-partner. 

## 2013-08-11 ENCOUNTER — Telehealth: Payer: Self-pay | Admitting: *Deleted

## 2013-08-11 ENCOUNTER — Ambulatory Visit (INDEPENDENT_AMBULATORY_CARE_PROVIDER_SITE_OTHER): Payer: BC Managed Care – PPO | Admitting: General Surgery

## 2013-08-11 ENCOUNTER — Encounter (INDEPENDENT_AMBULATORY_CARE_PROVIDER_SITE_OTHER): Payer: Self-pay | Admitting: General Surgery

## 2013-08-11 VITALS — BP 124/64 | HR 64 | Temp 98.6°F | Resp 14 | Ht 67.0 in | Wt 158.4 lb

## 2013-08-11 DIAGNOSIS — K287 Chronic gastrojejunal ulcer without hemorrhage or perforation: Secondary | ICD-10-CM

## 2013-08-11 DIAGNOSIS — Z9884 Bariatric surgery status: Secondary | ICD-10-CM

## 2013-08-11 NOTE — Patient Instructions (Signed)
Please contact Dr. Caryl Never or [facility] regarding smoking cessation programs

## 2013-08-11 NOTE — Progress Notes (Signed)
Chief complaint: Followup gastric bypass and marginal ulcer  History: The patient returns to the office with a history of gastric bypass for morbid morbid obesity in 2009. He has a history of early marginal ulcer within the first 6 months post surgery treated medically. In February of 2013 he presented with a perforatedUlcer at his gastrojejunostomy underwent laparoscopy and gram patch closure by Dr. Ezzard Standing. He has been on constant treatment with proton pump inhibitors and Carafate since that time. He had some temporary improvement after that and in July he had an endoscopy By Dr. Leone Payor showing essentially healing of his ulcer. However in recent weeks he has had increasing postprandial epigastric abdominal pain and nausea. He is able to eat but gets significant pain. Repeat endoscopy this month shows a moderate sized recurrent marginal ulcer. He's had no bleeding. His weight has been stable this year. 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 He continues to smoke. Past Medical History  Diagnosis Date  . 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 Depression   . Kidney stone   . Anemia   . GERD (gastroesophageal reflux disease)   . Hypertension     not tx for now  . Jejunal ulcer 2009    anastomotic  . Acute gastrojejunal anastomotic ulcer 03/2013  . 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 Allergy   . Asthma     as a child  . Varicose vein   . 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 Tonsillectomy    . Gastric bypass  2009    Dr. Johna Sheriff - Roux en Y  . Upper gastrointestinal endoscopy  02/19/11    normal post-op exam  . Laparoscopy  01/17/2012    Procedure: LAPAROSCOPY DIAGNOSTIC;  Surgeon: Kandis Cocking, MD;  Location: [facility] ORS;  Service: General;  Laterality: N/A;  OVERSEW PERFORATED GASTRIC JEJUNAL ULCER  . Bariatric surgery    . Tonsillectomy   Age 32  . Wisdom tooth extraction  Age 45   Current Outpatient Prescriptions  Medication Sig Dispense Refill  . Multiple Vitamins-Minerals (MULTIVITAMIN PO) Take 1 tablet by mouth daily.      Marland Kitchen omeprazole (PRILOSEC) 40 MG capsule Take 1 capsule (40 mg total) by mouth 2 (two) times daily before a meal.  60 capsule  5  . QUEtiapine (SEROQUEL) 100 MG tablet TAKE 1 TABLET BY MOUTH EVERY DAY AT BEDTIME  30 tablet  0  . sucralfate (CARAFATE) 1 GM/10ML suspension Take 10 mLs (1 g total) by mouth 4 (four) times daily -  with meals and at bedtime.  420 mL  1   No current facility-administered medications for this visit.   Exam: BP 124/64  Pulse 64  Temp(Src) 98.6 F (37 C) (Temporal)  Resp 14  Ht 5\' 7"  (1.702 m)  Wt 158 lb 6.4 oz (71.85 kg)  BMI 24.8 kg/m2 Total weight loss 205 pounds General: Thin but not unwell appearing Caucasian male Abdomen: Mild epigastric tenderness. Incisions well healed. No hernias.  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 A long discussion with him regarding treatment. I think it is vitally important that he stop smoking. I think this is contributing significantly to persistence of his ulcer. He understands and is willing to try. I advised that he contact Dr.  Burchette or  regarding smoking cessation program as it I did give him literature regarding options. He will continue proton pump inhibitor and Carafate. He will call for any worsening symptoms. I will see him back in 2 months. I would not recommend surgical intervention for persistent ulcer as long as he is smoking.

## 2013-08-11 NOTE — Telephone Encounter (Signed)
  Follow up Call-  Call back number 08/10/2013 05/31/2013 04/12/2013 02/18/2011  Post procedure Call Back phone  # [phone] hm [phone] hm  Permission to leave phone message Yes Yes Yes -     No answer at # given.  Left message on voice mail.

## 2013-08-16 ENCOUNTER — Other Ambulatory Visit (HOSPITAL_COMMUNITY): Payer: Self-pay | Admitting: Physician Assistant

## 2013-09-15 ENCOUNTER — Telehealth: Payer: Self-pay | Admitting: Family Medicine

## 2013-09-15 MED ORDER — SUCRALFATE 1 GM/10ML PO SUSP: 1.0000 g | Freq: Three times a day (TID) | ORAL | Status: DC

## 2013-09-15 MED ORDER — OMEPRAZOLE 40 MG PO CPDR: 40.0000 mg | DELAYED_RELEASE_CAPSULE | Freq: Two times a day (BID) | ORAL | Status: DC

## 2013-09-15 MED ORDER — QUETIAPINE FUMARATE 100 MG PO TABS: ORAL_TABLET | ORAL | Status: DC

## 2013-09-15 NOTE — Telephone Encounter (Signed)
yes

## 2013-09-15 NOTE — Telephone Encounter (Signed)
Pt states he and his wife [name] who are patients of Dr. Caryl Never will be moving out of state next week and would like to be worked in with Dr. Caryl Never for bronchitis and follow up on medications.  No available slots with PCP this week.  Please advise if ok to work pt in.

## 2013-09-15 NOTE — Telephone Encounter (Signed)
RX's sent to pharmacy 

## 2013-09-15 NOTE — Telephone Encounter (Signed)
Pt got appointment to see Dr. Nicki Reaper, is it okay to give the patient refills on his medication

## 2013-09-16 ENCOUNTER — Ambulatory Visit: Payer: Self-pay | Admitting: Internal Medicine

## 2013-09-16 ENCOUNTER — Encounter: Payer: Self-pay | Admitting: Internal Medicine

## 2013-09-16 ENCOUNTER — Ambulatory Visit (INDEPENDENT_AMBULATORY_CARE_PROVIDER_SITE_OTHER): Payer: BC Managed Care – PPO | Admitting: Internal Medicine

## 2013-09-16 VITALS — BP 118/72 | HR 72 | Temp 98.1°F | Wt 156.5 lb

## 2013-09-16 DIAGNOSIS — Z23 Encounter for immunization: Secondary | ICD-10-CM

## 2013-09-16 DIAGNOSIS — J069 Acute upper respiratory infection, unspecified: Secondary | ICD-10-CM

## 2013-09-16 MED ORDER — AZITHROMYCIN 250 MG PO TABS: ORAL_TABLET | ORAL | Status: DC

## 2013-09-16 MED ORDER — METHYLPREDNISOLONE ACETATE 80 MG/ML IJ SUSP
80.0000 mg | Freq: Once | INTRAMUSCULAR | Status: AC
  Administered 2013-09-16: 80 mg via INTRAMUSCULAR

## 2013-09-16 NOTE — Progress Notes (Signed)
HPI  Pt presents to the clinic today with c/o cold symptoms x 2 weeks. The worst part is the sore throat, cough and chest congestion. He does produce thick brown sputum. He denies fever, chills or body aches. He has tried Robitussin, Mucinex, cough drops and nothing seems to help. The cough is worse at night. He has not had much sleep in 3 nights. He does have a history of allergies but no asthma (remote history as a child). He does have sick contacts. Additionally, pt would like to know if I will increase his lamictal and seroquel before he moves out of the state tomorrow. He feels like neither drug is effective. These are prescribed by his psychiatrist. Review of Systems      Past Medical History  Diagnosis Date  . 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 Depression   . Kidney stone   . Anemia   . GERD (gastroesophageal reflux disease)   . Hypertension     not tx for now  . Jejunal ulcer 2009    anastomotic  . Acute gastrojejunal anastomotic ulcer 03/2013  . 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 Allergy   . Asthma     as a child  . Varicose vein   . 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 Heart disease Maternal Grandmother   . Hyperlipidemia Maternal Grandmother   . Hypertension Maternal Grandmother   . Mental illness Maternal Grandmother   . Diabetes Maternal Grandmother   . Irritable bowel syndrome Father   . Alcohol abuse Father   . Drug abuse Father   . Irritable bowel syndrome Brother   . Colon cancer Neg Hx   . Stomach cancer Neg Hx   . Esophageal cancer Neg Hx   . Rectal cancer Neg Hx   . Cystic fibrosis Mother   . Drug abuse Mother   . Alcohol abuse Mother   . Bipolar disorder Brother   . Alcohol abuse Paternal Uncle   . Drug abuse Paternal Uncle     History   Social History  . Marital Status: Married    Spouse Name: N/A    Number of Children: 0  . Years  of Education: N/A   Occupational History  . [employer]    Social History Main Topics  . Smoking status: Current Every Day Smoker -- 1.00 packs/day for 12 years    Types: Cigarettes  . Smokeless tobacco: Current User     Comment: less than 1/2 ppd.  . Alcohol Use: No  . Drug Use: No     Comment: vicodan, percocet 70 DAYS OFF DRUGS  . Sexual Activity: Yes   Other Topics Concern  . Not on file   Social History Narrative   06/04/2013 A[facility]  Tony Davila was born in [place], [place]. He has a half-brother and half-sister, both younger. His parents divorced when he was [age], and he was raised by his maternal grandmother and grandfather. He reports that his mother was not present during his childhood as she was dating men. Tony Davila did not meet his biological father again until he was 60 years of age. He graduated from high school, and has completed some college. He currently works for [employer]. He has been married for 9 years, and has no children. He currently lives with his wife and 52 year old grandmother,  for whom he and his wife care do to her dementia, CHF, and COPD. He denies any legal problems. He reports that he is agnostic. His hobbies include fishing. His social support system consists of his wife. 06/04/2013 A[facility]      No Known Allergies   Constitutional:  Denies headache, fatigue, fever or abrupt weight changes.  HEENT:  Positive sore throat. Denies eye redness, eye pain, pressure behind the eyes, facial pain, nasal congestion, ear pain, ringing in the ears, wax buildup, runny nose or bloody nose. Respiratory: Positive cough. Denies difficulty breathing or shortness of breath.  Cardiovascular: Denies chest pain, chest tightness, palpitations or swelling in the hands or feet.   No other specific complaints in a complete review of systems (except as listed in HPI above).  Objective:   BP 118/72  Pulse 72  Temp(Src) 98.1 F (36.7 C) (Oral)  Wt 156 lb 8 oz (70.988 kg)  BMI 24.51 kg/m2  SpO2  95% Wt Readings from Last 3 Encounters:  09/16/13 156 lb 8 oz (70.988 kg)  08/11/13 158 lb 6.4 oz (71.85 kg)  08/10/13 155 lb (70.308 kg)     General: Appears his stated age, well developed, well nourished in NAD. HEENT: Head: normal shape and size; Eyes: sclera white, no icterus, conjunctiva pink, PERRLA and EOMs intact; Ears: Tm's gray and intact, normal light reflex; Nose: mucosa pink and moist, septum midline; Throat/Mouth: + PND. Teeth present, mucosa erythematous and moist, no exudate noted, no lesions or ulcerations noted.  Neck: Mild cervical lymphadenopathy. Neck supple, trachea midline. No massses, lumps or thyromegaly present.  Cardiovascular: Normal rate and rhythm. S1,S2 noted.  No murmur, rubs or gallops noted. No JVD or BLE edema. No carotid bruits noted. Pulmonary/Chest: Normal effort and bilateral inspiratory wheeze. No respiratory distress. No rales or ronchi noted.      Assessment & Plan:   Upper Respiratory Infection:  Get some rest and drink plenty of water Do salt water gargles for the sore throat Given duration of symptoms will give eRx for Azithromax x 5 days 80 mg Depo IM today  I will not refill or increase the dose of your psych meds. You need to call you psychiatrist.  RTC as needed or if symptoms persist.

## 2013-09-16 NOTE — Patient Instructions (Signed)

## 2013-09-16 NOTE — Addendum Note (Signed)
Addended by: Darnell Level on: 09/16/2013 01:47 PM   Modules accepted: Orders

## 2013-10-08 ENCOUNTER — Ambulatory Visit (INDEPENDENT_AMBULATORY_CARE_PROVIDER_SITE_OTHER): Payer: BC Managed Care – PPO | Admitting: General Surgery

## 2015-05-26 ENCOUNTER — Emergency Department (HOSPITAL_COMMUNITY): Payer: Medicaid Other | Admitting: Anesthesiology

## 2015-05-26 ENCOUNTER — Encounter (HOSPITAL_COMMUNITY): Admission: EM | Disposition: A | Discharge status: Home / Self Care | Payer: Self-pay | Source: Home / Self Care

## 2015-05-26 ENCOUNTER — Inpatient Hospital Stay (HOSPITAL_COMMUNITY)
Admission: EM | Admit: 2015-05-26 | Discharge: 2015-05-30 | DRG: 331 | Disposition: A | Discharge status: Home / Self Care | Payer: Medicaid Other | Attending: Surgery | Admitting: Surgery

## 2015-05-26 ENCOUNTER — Encounter (HOSPITAL_COMMUNITY): Payer: Self-pay | Admitting: Emergency Medicine

## 2015-05-26 DIAGNOSIS — I10 Essential (primary) hypertension: Secondary | ICD-10-CM | POA: Diagnosis present

## 2015-05-26 DIAGNOSIS — Z9884 Bariatric surgery status: Secondary | ICD-10-CM | POA: Diagnosis not present

## 2015-05-26 DIAGNOSIS — F319 Bipolar disorder, unspecified: Secondary | ICD-10-CM | POA: Diagnosis present

## 2015-05-26 DIAGNOSIS — K219 Gastro-esophageal reflux disease without esophagitis: Secondary | ICD-10-CM | POA: Diagnosis present

## 2015-05-26 DIAGNOSIS — F17210 Nicotine dependence, cigarettes, uncomplicated: Secondary | ICD-10-CM | POA: Diagnosis present

## 2015-05-26 DIAGNOSIS — Z87442 Personal history of urinary calculi: Secondary | ICD-10-CM | POA: Diagnosis not present

## 2015-05-26 DIAGNOSIS — Z79899 Other long term (current) drug therapy: Secondary | ICD-10-CM | POA: Diagnosis not present

## 2015-05-26 DIAGNOSIS — R1013 Epigastric pain: Secondary | ICD-10-CM | POA: Diagnosis present

## 2015-05-26 DIAGNOSIS — K275 Chronic or unspecified peptic ulcer, site unspecified, with perforation: Secondary | ICD-10-CM

## 2015-05-26 DIAGNOSIS — Z8711 Personal history of peptic ulcer disease: Secondary | ICD-10-CM | POA: Diagnosis not present

## 2015-05-26 DIAGNOSIS — K631 Perforation of intestine (nontraumatic): Secondary | ICD-10-CM | POA: Diagnosis present

## 2015-05-26 DIAGNOSIS — K449 Diaphragmatic hernia without obstruction or gangrene: Secondary | ICD-10-CM | POA: Diagnosis present

## 2015-05-26 HISTORY — PX: GASTRIC ROUX-EN-Y: SHX5262

## 2015-05-26 SURGERY — LAPAROSCOPIC ROUX-EN-Y GASTRIC BYPASS WITH UPPER ENDOSCOPY
Anesthesia: General | Site: Abdomen

## 2015-05-26 MED ORDER — PROPOFOL 10 MG/ML IV BOLUS
INTRAVENOUS | Status: AC
  Filled 2015-05-26: qty 20

## 2015-05-26 MED ORDER — PHENOL 1.4 % MT LIQD
1.0000 | OROMUCOSAL | Status: DC | PRN
  Filled 2015-05-26: qty 177

## 2015-05-26 MED ORDER — LACTATED RINGERS IV SOLN: INTRAVENOUS | Status: DC

## 2015-05-26 MED ORDER — HEPARIN SODIUM (PORCINE) 5000 UNIT/ML IJ SOLN
5000.0000 [IU] | Freq: Three times a day (TID) | INTRAMUSCULAR | Status: DC
  Administered 2015-05-26 – 2015-05-30 (×12): 5000 [IU] via SUBCUTANEOUS
  Filled 2015-05-26 (×15): qty 1

## 2015-05-26 MED ORDER — FENTANYL CITRATE (PF) 100 MCG/2ML IJ SOLN
INTRAMUSCULAR | Status: DC | PRN
  Administered 2015-05-26 (×3): 50 ug via INTRAVENOUS
  Administered 2015-05-26: 100 ug via INTRAVENOUS

## 2015-05-26 MED ORDER — 0.9 % SODIUM CHLORIDE (POUR BTL) OPTIME
TOPICAL | Status: DC | PRN
  Administered 2015-05-26: 1000 mL

## 2015-05-26 MED ORDER — MIDAZOLAM HCL 2 MG/2ML IJ SOLN
INTRAMUSCULAR | Status: AC
  Filled 2015-05-26: qty 2

## 2015-05-26 MED ORDER — TISSEEL VH 10 ML EX KIT
PACK | CUTANEOUS | Status: AC
  Filled 2015-05-26: qty 1

## 2015-05-26 MED ORDER — SUCCINYLCHOLINE CHLORIDE 20 MG/ML IJ SOLN
INTRAMUSCULAR | Status: DC | PRN
Start: 2015-05-26 — End: 2015-05-26
  Administered 2015-05-26: 100 mg via INTRAVENOUS

## 2015-05-26 MED ORDER — LACTATED RINGERS IR SOLN
Status: DC | PRN
  Administered 2015-05-26: 3000 mL

## 2015-05-26 MED ORDER — HYDROMORPHONE HCL 1 MG/ML IJ SOLN
INTRAMUSCULAR | Status: AC
  Administered 2015-05-27: 2 mg via INTRAVENOUS
  Filled 2015-05-26: qty 1

## 2015-05-26 MED ORDER — BUPIVACAINE HCL (PF) 0.25 % IJ SOLN
INTRAMUSCULAR | Status: DC | PRN
  Administered 2015-05-26: 30 mL

## 2015-05-26 MED ORDER — HYDROMORPHONE HCL 1 MG/ML IJ SOLN
0.2500 mg | INTRAMUSCULAR | Status: DC | PRN
  Administered 2015-05-26 (×2): 0.5 mg via INTRAVENOUS

## 2015-05-26 MED ORDER — NEOSTIGMINE METHYLSULFATE 10 MG/10ML IV SOLN
INTRAVENOUS | Status: AC
  Filled 2015-05-26: qty 1

## 2015-05-26 MED ORDER — POTASSIUM CHLORIDE IN NACL 20-0.45 MEQ/L-% IV SOLN
INTRAVENOUS | Status: DC
  Administered 2015-05-26 – 2015-05-27 (×5): via INTRAVENOUS
  Administered 2015-05-28: 125 mL/h via INTRAVENOUS
  Administered 2015-05-29: 12:00:00 via INTRAVENOUS
  Filled 2015-05-26 (×15): qty 1000

## 2015-05-26 MED ORDER — PROMETHAZINE HCL 25 MG/ML IJ SOLN: 12.5000 mg | Freq: Four times a day (QID) | INTRAMUSCULAR | Status: AC | PRN

## 2015-05-26 MED ORDER — DIPHENHYDRAMINE HCL 50 MG/ML IJ SOLN
12.5000 mg | Freq: Four times a day (QID) | INTRAMUSCULAR | Status: AC | PRN
  Administered 2015-05-26 – 2015-05-29 (×8): 12.5 mg via INTRAVENOUS
  Filled 2015-05-26 (×8): qty 1

## 2015-05-26 MED ORDER — ONDANSETRON HCL 4 MG PO TABS: 4.0000 mg | ORAL_TABLET | Freq: Four times a day (QID) | ORAL | Status: DC | PRN

## 2015-05-26 MED ORDER — MIDAZOLAM HCL 5 MG/5ML IJ SOLN
INTRAMUSCULAR | Status: DC | PRN
  Administered 2015-05-26: 2 mg via INTRAVENOUS

## 2015-05-26 MED ORDER — LIDOCAINE HCL (CARDIAC) 20 MG/ML IV SOLN
INTRAVENOUS | Status: AC
  Filled 2015-05-26: qty 5

## 2015-05-26 MED ORDER — ONDANSETRON HCL 4 MG/2ML IJ SOLN
INTRAMUSCULAR | Status: DC | PRN
  Administered 2015-05-26: 4 mg via INTRAVENOUS

## 2015-05-26 MED ORDER — ROCURONIUM BROMIDE 100 MG/10ML IV SOLN
INTRAVENOUS | Status: DC | PRN
  Administered 2015-05-26: 40 mg via INTRAVENOUS
  Administered 2015-05-26: 10 mg via INTRAVENOUS

## 2015-05-26 MED ORDER — BUPIVACAINE HCL (PF) 0.25 % IJ SOLN
INTRAMUSCULAR | Status: AC
  Filled 2015-05-26: qty 60

## 2015-05-26 MED ORDER — LIDOCAINE HCL (CARDIAC) 20 MG/ML IV SOLN
INTRAVENOUS | Status: DC | PRN
  Administered 2015-05-26: 50 mg via INTRAVENOUS

## 2015-05-26 MED ORDER — FENTANYL CITRATE (PF) 250 MCG/5ML IJ SOLN
INTRAMUSCULAR | Status: AC
  Filled 2015-05-26: qty 5

## 2015-05-26 MED ORDER — HYDROMORPHONE HCL 1 MG/ML IJ SOLN
0.5000 mg | INTRAMUSCULAR | Status: DC | PRN
  Administered 2015-05-26: 2 mg via INTRAVENOUS
  Administered 2015-05-26: 0.5 mg via INTRAVENOUS
  Administered 2015-05-26 (×2): 1 mg via INTRAVENOUS
  Administered 2015-05-26 – 2015-05-27 (×5): 2 mg via INTRAVENOUS
  Administered 2015-05-27: 1 mg via INTRAVENOUS
  Administered 2015-05-27 (×5): 2 mg via INTRAVENOUS
  Filled 2015-05-26: qty 1
  Filled 2015-05-26: qty 2
  Filled 2015-05-26 (×2): qty 1
  Filled 2015-05-26 (×9): qty 2
  Filled 2015-05-26: qty 1
  Filled 2015-05-26 (×2): qty 2

## 2015-05-26 MED ORDER — CEFOTETAN DISODIUM-DEXTROSE 2-2.08 GM-% IV SOLR
INTRAVENOUS | Status: AC
  Filled 2015-05-26: qty 50

## 2015-05-26 MED ORDER — MORPHINE SULFATE 2 MG/ML IJ SOLN
1.0000 mg | INTRAMUSCULAR | Status: DC | PRN
  Administered 2015-05-26 (×2): 2 mg via INTRAVENOUS
  Administered 2015-05-26: 1 mg via INTRAVENOUS
  Administered 2015-05-26: 2 mg via INTRAVENOUS
  Filled 2015-05-26 (×4): qty 1

## 2015-05-26 MED ORDER — PIPERACILLIN-TAZOBACTAM 3.375 G IVPB
3.3750 g | Freq: Three times a day (TID) | INTRAVENOUS | Status: DC
  Administered 2015-05-26 – 2015-05-30 (×13): 3.375 g via INTRAVENOUS
  Filled 2015-05-26 (×14): qty 50

## 2015-05-26 MED ORDER — NEOSTIGMINE METHYLSULFATE 10 MG/10ML IV SOLN
INTRAVENOUS | Status: DC | PRN
  Administered 2015-05-26: 4 mg via INTRAVENOUS

## 2015-05-26 MED ORDER — GLYCOPYRROLATE 0.2 MG/ML IJ SOLN
INTRAMUSCULAR | Status: DC | PRN
  Administered 2015-05-26: 0.6 mg via INTRAVENOUS

## 2015-05-26 MED ORDER — GLYCOPYRROLATE 0.2 MG/ML IJ SOLN
INTRAMUSCULAR | Status: AC
  Filled 2015-05-26: qty 3

## 2015-05-26 MED ORDER — DEXTROSE 5 % IV SOLN
2.0000 g | Freq: Two times a day (BID) | INTRAVENOUS | Status: DC
  Administered 2015-05-26: 2 g via INTRAVENOUS
  Filled 2015-05-26 (×2): qty 2

## 2015-05-26 MED ORDER — LACTATED RINGERS IV SOLN
INTRAVENOUS | Status: DC | PRN
Start: 2015-05-26 — End: 2015-05-26
  Administered 2015-05-26 (×2): via INTRAVENOUS

## 2015-05-26 MED ORDER — ROCURONIUM BROMIDE 100 MG/10ML IV SOLN
INTRAVENOUS | Status: AC
  Filled 2015-05-26: qty 1

## 2015-05-26 MED ORDER — PROPOFOL 10 MG/ML IV BOLUS
INTRAVENOUS | Status: DC | PRN
  Administered 2015-05-26: 180 mg via INTRAVENOUS

## 2015-05-26 MED ORDER — PANTOPRAZOLE SODIUM 40 MG IV SOLR
40.0000 mg | Freq: Two times a day (BID) | INTRAVENOUS | Status: DC
  Administered 2015-05-26 – 2015-05-30 (×9): 40 mg via INTRAVENOUS
  Filled 2015-05-26 (×10): qty 40

## 2015-05-26 MED ORDER — ONDANSETRON HCL 4 MG/2ML IJ SOLN: 4.0000 mg | Freq: Four times a day (QID) | INTRAMUSCULAR | Status: DC | PRN

## 2015-05-26 MED ORDER — ONDANSETRON HCL 4 MG/2ML IJ SOLN
INTRAMUSCULAR | Status: AC
  Filled 2015-05-26: qty 2

## 2015-05-26 SURGICAL SUPPLY — 79 items
BLADE SURG 15 STRL LF DISP TIS (BLADE) ×1 IMPLANT
BLADE SURG 15 STRL SS (BLADE) ×2
CABLE HIGH FREQUENCY MONO STRZ (ELECTRODE) ×3 IMPLANT
CHLORAPREP W/TINT 26ML (MISCELLANEOUS) ×3 IMPLANT
CLIP SUT LAPRA TY ABSORB (SUTURE) IMPLANT
DECANTER SPIKE VIAL GLASS SM (MISCELLANEOUS) ×3 IMPLANT
DERMABOND ADVANCED (GAUZE/BANDAGES/DRESSINGS) ×2
DERMABOND ADVANCED .7 DNX12 (GAUZE/BANDAGES/DRESSINGS) ×1 IMPLANT
DEVICE SUTURE ENDOST 10MM (ENDOMECHANICALS) IMPLANT
DISSECTOR BLUNT TIP ENDO 5MM (MISCELLANEOUS) IMPLANT
DRAIN CHANNEL 19F RND (DRAIN) ×3 IMPLANT
DRAIN PENROSE 18X1/4 LTX STRL (WOUND CARE) IMPLANT
DRAPE CAMERA CLOSED 9X96 (DRAPES) ×3 IMPLANT
DUPLOJECT EASY PREP 4ML (MISCELLANEOUS) IMPLANT
EVACUATOR DRAINAGE 10X20 100CC (DRAIN) ×1 IMPLANT
EVACUATOR SILICONE 100CC (DRAIN) ×2
GAUZE SPONGE 4X4 16PLY XRAY LF (GAUZE/BANDAGES/DRESSINGS) ×3 IMPLANT
GLOVE BIOGEL PI IND STRL 6.5 (GLOVE) ×1 IMPLANT
GLOVE BIOGEL PI IND STRL 8.5 (GLOVE) ×1 IMPLANT
GLOVE BIOGEL PI INDICATOR 6.5 (GLOVE) ×2
GLOVE BIOGEL PI INDICATOR 8.5 (GLOVE) ×2
GLOVE SURG SIGNA 7.5 PF LTX (GLOVE) ×3 IMPLANT
GLOVE SURG SS PI 6.5 STRL IVOR (GLOVE) ×3 IMPLANT
GLOVE SURG SS PI 8.5 STRL IVOR (GLOVE) ×2
GLOVE SURG SS PI 8.5 STRL STRW (GLOVE) ×1 IMPLANT
GOWN SPEC L4 XLG W/TWL (GOWN DISPOSABLE) ×9 IMPLANT
GOWN STRL REUS W/TWL XL LVL3 (GOWN DISPOSABLE) IMPLANT
HOVERMATT SINGLE USE (MISCELLANEOUS) ×3 IMPLANT
IV NS IRRIG 3000ML ARTHROMATIC (IV SOLUTION) ×3 IMPLANT
KIT BASIN OR (CUSTOM PROCEDURE TRAY) ×3 IMPLANT
KIT GASTRIC LAVAGE 34FR ADT (SET/KITS/TRAYS/PACK) IMPLANT
LIQUID BAND (GAUZE/BANDAGES/DRESSINGS) ×3 IMPLANT
NEEDLE SPNL 22GX3.5 QUINCKE BK (NEEDLE) ×3 IMPLANT
NS IRRIG 1000ML POUR BTL (IV SOLUTION) ×3 IMPLANT
PACK CARDIOVASCULAR III (CUSTOM PROCEDURE TRAY) ×3 IMPLANT
PEN SKIN MARKING BROAD (MISCELLANEOUS) ×3 IMPLANT
POUCH SPECIMEN RETRIEVAL 10MM (ENDOMECHANICALS) IMPLANT
RELOAD 45 VASCULAR/THIN (ENDOMECHANICALS) IMPLANT
RELOAD ENDO STITCH 2.0 (ENDOMECHANICALS)
RELOAD STAPLE TA45 3.5 REG BLU (ENDOMECHANICALS) IMPLANT
RELOAD STAPLER BLUE 60MM (STAPLE) IMPLANT
RELOAD STAPLER GOLD 60MM (STAPLE) IMPLANT
RELOAD STAPLER WHITE 60MM (STAPLE) IMPLANT
SCISSORS LAP 5X35 DISP (ENDOMECHANICALS) ×3 IMPLANT
SEALANT SURGICAL APPL DUAL CAN (MISCELLANEOUS) ×3 IMPLANT
SET IRRIG TUBING LAPAROSCOPIC (IRRIGATION / IRRIGATOR) ×3 IMPLANT
SHEARS HARMONIC ACE PLUS 36CM (ENDOMECHANICALS) ×3 IMPLANT
SLEEVE ADV FIXATION 12X100MM (TROCAR) IMPLANT
SLEEVE ENDOPATH XCEL 5M (ENDOMECHANICALS) ×3 IMPLANT
SOLUTION ANTI FOG 6CC (MISCELLANEOUS) ×3 IMPLANT
STAPLE ECHEON FLEX 60 POW ENDO (STAPLE) IMPLANT
STAPLER RELOAD BLUE 60MM (STAPLE)
STAPLER RELOAD GOLD 60MM (STAPLE)
STAPLER RELOAD WHITE 60MM (STAPLE)
STAPLER VISISTAT 35W (STAPLE) ×3 IMPLANT
SUT ETHILON 2 0 PS N (SUTURE) ×3 IMPLANT
SUT MNCRL AB 4-0 PS2 18 (SUTURE) ×3 IMPLANT
SUT RELOAD ENDO STITCH 2 48X1 (ENDOMECHANICALS)
SUT RELOAD ENDO STITCH 2.0 (ENDOMECHANICALS)
SUT SILK 2 0 SH (SUTURE) ×6 IMPLANT
SUT VIC AB 2-0 SH 27 (SUTURE)
SUT VIC AB 2-0 SH 27X BRD (SUTURE) IMPLANT
SUT VICRYL 0 UR6 27IN ABS (SUTURE) ×3 IMPLANT
SUTURE RELOAD END STTCH 2 48X1 (ENDOMECHANICALS) IMPLANT
SUTURE RELOAD ENDO STITCH 2.0 (ENDOMECHANICALS) IMPLANT
SYR 10ML ECCENTRIC (SYRINGE) IMPLANT
SYR 20CC LL (SYRINGE) ×3 IMPLANT
SYR 50ML LL SCALE MARK (SYRINGE) ×3 IMPLANT
TOWEL OR 17X26 10 PK STRL BLUE (TOWEL DISPOSABLE) ×3 IMPLANT
TRAY FOLEY W/METER SILVER 14FR (SET/KITS/TRAYS/PACK) IMPLANT
TROCAR ADV FIXATION 12X100MM (TROCAR) IMPLANT
TROCAR ADV FIXATION 5X100MM (TROCAR) ×3 IMPLANT
TROCAR BLADELESS OPT 5 100 (ENDOMECHANICALS) ×3 IMPLANT
TROCAR UNIVERSAL OPT 12M 100M (ENDOMECHANICALS) IMPLANT
TROCAR XCEL 12X100 BLDLESS (ENDOMECHANICALS) IMPLANT
TROCAR XCEL BLUNT TIP 100MML (ENDOMECHANICALS) ×3 IMPLANT
TROCAR XCEL NON-BLD 11X100MML (ENDOMECHANICALS) ×3 IMPLANT
TUBING ENDO SMARTCAP (MISCELLANEOUS) ×3 IMPLANT
TUBING FILTER THERMOFLATOR (ELECTROSURGICAL) IMPLANT

## 2015-05-26 NOTE — Anesthesia Postprocedure Evaluation (Signed)
  Anesthesia Post-op Note  Patient: Tony Davila  Procedure(s) Performed: Procedure(s) (LRB): DIAGNOSTIC LAPAROSCOPY WITH OVERSEW OF GASTRIC DUODENAL ULCER WITH OMENTAL PATCH (N/A)  Patient Location: PACU  Anesthesia Type: General  Level of Consciousness: awake and alert   Airway and Oxygen Therapy: Patient Spontanous Breathing  Post-op Pain: mild  Post-op Assessment: Post-op Vital signs reviewed, Patient's Cardiovascular Status Stable, Respiratory Function Stable, Patent Airway and No signs of Nausea or vomiting  Last Vitals:  Filed Vitals:   05/26/15 0445  BP: 132/70  Pulse: 111  Temp: 36.8 C  Resp: 22    Post-op Vital Signs: stable   Complications: No apparent anesthesia complications

## 2015-05-26 NOTE — Op Note (Signed)
Tony Davila, TRIVEDI NO.:  [id number]  MEDICAL RECORD NO.:  [id number]  LOCATION:  1606                         FACILITY:  [facility]  PHYSICIAN:  Sandria Bales. Ezzard Standing, M.D.  DATE OF BIRTH:  [birth year]  DATE OF PROCEDURE:  05/26/2015                              OPERATIVE REPORT   PREOPERATIVE DIAGNOSIS:  Pneumoperitoneum with probable perforation at gastrojejunostomy.  POSTOPERATIVE DIAGNOSIS:  Perforation at gastrojejunostomy.  PROCEDURE:  Laparoscopic exploration with over-sew of gastrojejunal perforation and omental patch.  SURGEON:  Sandria Bales. Ezzard Standing, M.D.  ASSISTANT:  No first assistant.  ANESTHESIA:  General endotracheal supervised by Dr. Ronelle Nigh.  ESTIMATED BLOOD LOSS:  Minimal.  DRAINS:  Drains left in was one 19-French Blake drain.  ANESTHESIA:  Local anesthesia was 30 mL of 0.25% Marcaine.  SPECIMEN:  There was no specimen.  COUNTS:  The count was correct.  INDICATION FOR PROCEDURE:  Mr. Schoenberger is a 34 year old white male who actually lives in [place] right now, but has seen Dr. Evelena Peat in the past.  He is visiting his mother in [place], [place], when he became progressively sick.  He had a CT scan at [facility] that showed pneumoperitoneum.  Dr. Barrie Dunker, the general surgeon covering [facility], called me and talked to me about the patient.  I think that he needs a laparoscopic exploration and he was transferred to [facility].  He has a history of a Roux-en-Y gastric bypass by Dr. Jaclynn Guarneri on April 30, 2008.  He has had a prior perforation of his gastrojejunostomy on January 17, 2012 repaired by Dr. Algis Downs. Philena Obey.  He has smoked cigarettes on and off, but because of anxiety and depression, started smoking again about 8 weeks ago.  The indications and potential risks of surgery were explained to the patient.  Potential risks include, but not limited to bleeding, infection, which I think he already has, the  need for open surgery, and recurrence of this ulcer vertically if he keeps smoking.  OPERATIVE NOTE:  The patient was taken to room #1 at [facility]  He underwent anesthesia supervised by Dr. Ronelle Nigh.  He was given 2 g of cefotetan at the initiation of procedure.  He had a Foley catheter placed.  His abdomen was prepped with ChloraPrep.  A time-out was held and surgical checklist was run.  I accessed the abdominal cavity with a 12 mm Hasson trocar.  I placed 3 additional trocars, a 5 mm subxiphoid trocar for the liver retractor, a 5 mm left upper quadrant, and a 5 mm right upper quadrant trocar.  I did abdominal exploration.  Right and left lobes of liver were unremarkable.  Gallbladder was unremarkable.  He had purulence over the right lobe of the liver, under his left lobe of the liver, and along his left colonic gutter into his pelvis.  I identified what looked like the most purulent area that was around his gastrojejunostomy.  I unroofed this and found a small, approximate 7 mm perforation at the gastrojejunostomy.  I used a 2-0 silk suture and 2 sutures to approximate the anastomosis and then laid a piece of omentum as a patch  over this.  Photos were taken and are in the chart.  I then irrigated his abdomen with 4 L of saline.  I left a 19-French Harrison MonsBlake in the left upper quadrant, which I sewed in place with 2-0 nylon suture.  I then re-examined the abdomen and tried to suck out as much fluid as I could under the drain to also help drain out fluid.  I then closed the umbilical port with 0-Vicryl suture.  The skin of each port with a 4-0 Monocryl suture.  I painted the wounds with liquid band.  He tolerated the procedure well, was transported to recovery room in good condition.  Sponge and needle count were correct at the end of the case.   Sandria Balesavid H. Ezzard StandingNewman, M.D., [facility], scribe for Epic   DHN/MEDQ  D:  05/26/2015  T:  05/26/2015  Job:  [id number]  cc:   Evelena PeatBruce Burchette, M.D.

## 2015-05-26 NOTE — Transfer of Care (Signed)
Immediate Anesthesia Transfer of Care Note  Patient: Tony Davila  Procedure(s) Performed: Procedure(s): DIAGNOSTIC LAPAROSCOPY WITH OVERSEW OF GASTRIC DUODENAL ULCER WITH OMENTAL PATCH (N/A)  Patient Location: PACU  Anesthesia Type:General  Level of Consciousness: awake, alert  and oriented  Airway & Oxygen Therapy: Patient Spontanous Breathing and Patient connected to face mask oxygen  Post-op Assessment: Report given to RN and Post -op Vital signs reviewed and stable  Post vital signs: Reviewed and stable  Last Vitals:  Filed Vitals:   05/26/15 0009  BP: 125/70  Pulse: 85  Temp: 37.1 C  Resp: 18    Complications: No apparent anesthesia complications

## 2015-05-26 NOTE — H&P (Addendum)
Re:   Tony Davila DOB:   Jan 30, 1981 MRN:   [id number]  ASSESSMENT AND PLAN: 1.  Pneumoperitoneum   Probable perforation at known ulcer  Plan:  To OR to try to manage this laparoscopically.  Discussed risks of surgery - bleeding, continued leak and infection (which he already has).  2. History of jejunal ulcer.  Has had one prior perf and at least 2 bleeding episodes that required admission.  3. RYGB by Dr. Leonard Schwartz. [place]. 04/30/2008 - he said he weighed 376 at the time of surgery, he is down to 160 pound now. 4. Smokes. Knows this is bad for his health.  He says that he started again about 2 months ago due to stress 5. Depression/Bipolar 1 - sees Dr. Leda Roys in  6. HH. 7. Remote history of kidney stones. 8.  In his med rec, it state that he is taking ibuprofen - will need to stop this  Chief Complaint  Patient presents with  . Abdominal Pain   REFERRING PHYSICIAN:   Dr. Franchot Erichsen, [facility]  HISTORY OF PRESENT ILLNESS: Tony Davila is a 34 y.o. (DOB: July 21, 1981)  white  male whose primary care physician is Tony Covey, MD   He is transferred here from [facility] for pneumoperitoneum - s/p RYGB with history of perforation.   Patient had a RYGB by Dr. Johna Sheriff about 04/30/2008. I did a laparoscopic exploration for a GJ perforation in 01/17/2012.   He moved to [place], [place], in 2014.  He worked for [employer] ([employer]), but he was recently laid off because of missed work.  He says that he had a bleeding ulcer in Feb 2015.  He had a laparoscopy for intussception in June 2015.  But it sounds like resolved prior to surgery - at least he did not have a bowel resection.  He was hospitalized in April 2016 for bleeding ulcer.  He is due to have another endoscopy in July.  He on omeprazole, 40 mg QD.  He was down here, visiting his mother in [place], [place], when he got sick.  He said that he has had a vague abdominal pain for  about one week.  But then in the last 24 hours, he has had a stabbing epigastric pain.  This brought him to [facility].  Dr. Franchot Erichsen talked to me on the phone about transferring him down here.  He has been given Dilaudid, so his pain is a little better now.   He had a CT scan at [facility] 05/25/2015 which showed pneumoperitoneum in his upper abdomen.  I reviewed the films with Dr. Alcide Clever.    Past Medical History  Diagnosis Date  . 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 Depression   . Kidney stone   . Anemia   . GERD (gastroesophageal reflux disease)   . Hypertension     not tx for now  . Jejunal ulcer 2009    anastomotic  . Acute gastrojejunal anastomotic ulcer 03/2013  . 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 Allergy   . Asthma     as a child  . Varicose vein   . 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 Tonsillectomy    . Gastric bypass  2009    Dr. Johna Sheriff - Roux en Y  . Upper gastrointestinal endoscopy  02/19/11    normal post-op exam  . Laparoscopy  01/17/2012    Procedure: LAPAROSCOPY DIAGNOSTIC;  Surgeon: Kandis Cockingavid H Lavette Yankovich, MD;  Location: [facility] ORS;  Service: General;  Laterality: N/A;  OVERSEW PERFORATED GASTRIC JEJUNAL ULCER  . Bariatric surgery    . Tonsillectomy  Age 34  . Wisdom tooth extraction  Age 34      No current facility-administered medications for this encounter.   Current Outpatient Prescriptions  Medication Sig Dispense Refill  . azithromycin (ZITHROMAX) 250 MG tablet Take 2 tablets today then 1 tablet daily for 4 days 6 tablet 0  . Multiple Vitamins-Minerals (MULTIVITAMIN PO) Take 1 tablet by mouth daily.    Marland Kitchen. omeprazole (PRILOSEC) 40 MG capsule Take 1 capsule (40 mg total) by mouth 2 (two) times daily before a meal. 60 capsule 5  . QUEtiapine (SEROQUEL) 100 MG tablet Take 300 mg by mouth at bedtime. TAKE 1 TABLET AT BEDTIME    . sucralfate  (CARAFATE) 1 GM/10ML suspension Take 10 mLs (1 g total) by mouth 4 (four) times daily -  with meals and at bedtime. 420 mL 1     No Known Allergies  REVIEW OF SYSTEMS: Skin:  No history of rash.  No history of abnormal moles. Infection:  No history of hepatitis or HIV.  No history of MRSA. Neurologic:  No history of stroke.  No history of seizure.  No history of headaches. Cardiac:  No history of hypertension. No history of heart disease.  No history of prior cardiac catheterization.  No history of seeing a cardiologist. Pulmonary:  Smokes.  He knows that this is bad for his health.  Endocrine:  No diabetes. No thyroid disease. Gastrointestinal:  See HPI.  He has also had some constipation.Urologic:  No history of kidney stones.  No history of bladder infections. Musculoskeletal:  No history of joint or back disease. Hematologic:  No bleeding disorder.  No history of anemia.  Not anticoagulated. Psycho-social:  Depression/Bipolar 1   SOCIAL and FAMILY HISTORY: Married. Wife, Hassel NethJennier, at her mother in laws home. Mother's phone number - [phone]  PHYSICAL EXAM: There were no vitals taken for this visit.  General: Thin WM who is alert. HEENT: Normal. Pupils equal.  Has bilateral ear percings. Neck: Supple. No mass.  No thyroid mass. Lymph Nodes:  No supraclavicular or cervical nodes. Lungs: Clear to auscultation and symmetric breath sounds. Heart:  RRR. No murmur or rub.  Abdomen: Soft. No mass. Tender epigastrium.  Multiple laparoscopic scars.  Quiet.  Extremities:  Good strength and ROM  in upper and lower extremities. Neurologic:  Grossly intact to motor and sensory function. Psychiatric: Has normal mood and affect. Behavior is normal.   DATA REVIEWED: Epic notes.  Tony Kinavid Jonika Critz, MD,  [facility] [address]  [address]   [place]    [id number] Phone:  [phone] FAX:  [phone]

## 2015-05-26 NOTE — Progress Notes (Signed)
JP drain dressing saturated with red drainage. Reinforces with 4x4's and ABD pad. C/O pain with foley cath. Meds given. "I'll take it out myself after explanation for its presence. Minimal urinary output.

## 2015-05-26 NOTE — Progress Notes (Signed)
On call physician called about drainage around JP drain site.   Wilson, MD called back and was informed about color and amount of drainage. MD informed RN staff to keep reinforcing drain site as needed.   He stated to remove old reinforcement and add new gauze/drain sponge/ ABD as needed.

## 2015-05-26 NOTE — ED Notes (Signed)
[facility].   20g Left AC Zosyn, Dilaudid  125/86, 80hr, 98%RA, 98.8T, 16Resp.

## 2015-05-26 NOTE — Progress Notes (Signed)
Patient ID: Tony Davila, male   DOB: 08-11-1981, 34 y.o.   MRN: [id number] Day of Surgery  Subjective: Pt c/o soreness, but otherwise ok.  No nausea  Objective: Vital signs in last 24 hours: Temp:  [98.2 F (36.8 C)-99.5 F (37.5 C)] 99.5 F (37.5 C) (07/01 1134) Pulse Rate:  [85-132] 101 (07/01 1134) Resp:  [18-27] 18 (07/01 1134) BP: (110-144)/(55-78) 110/55 mmHg (07/01 1134) SpO2:  [96 %-100 %] 100 % (07/01 1134) Weight:  [72.576 kg (160 lb)] 72.576 kg (160 lb) (07/01 0520)    Intake/Output from previous day: 06/30 0701 - 07/01 0700 In: 1740 [I.V.:1740] Out: 515 [Urine:285; Drains:30; Blood:200] Intake/Output this shift:    PE: Abd: soft, appropriately tender, hypoactive BS, ND, JP drain with serosang output Heart: regular Lungs: CTAB  Lab Results:  No results for input(s): WBC, HGB, HCT, PLT in the last 72 hours. BMET No results for input(s): NA, K, CL, CO2, GLUCOSE, BUN, CREATININE, CALCIUM in the last 72 hours. PT/INR No results for input(s): LABPROT, INR in the last 72 hours. CMP     Component Value Date/Time   NA 141 05/12/2013 2356   K 4.7 05/12/2013 2356   CL 104 05/12/2013 2356   CO2 28 05/12/2013 2356   GLUCOSE 91 05/12/2013 2356   BUN 16 05/12/2013 2356   CREATININE 0.68 05/12/2013 2356   CALCIUM 9.7 05/12/2013 2356   PROT 6.6 05/12/2013 2356   ALBUMIN 4.1 05/12/2013 2356   AST 17 05/12/2013 2356   ALT 16 05/12/2013 2356   ALKPHOS 106 05/12/2013 2356   BILITOT 0.5 05/12/2013 2356   GFRNONAA >90 05/12/2013 2356   GFRAA >90 05/12/2013 2356   Lipase     Component Value Date/Time   LIPASE 66* 04/01/2013 2243       Studies/Results: No results found.  Anti-infectives: Anti-infectives    Start     Dose/Rate Route Frequency Ordered Stop   05/26/15 0800  piperacillin-tazobactam (ZOSYN) IVPB 3.375 g     3.375 g 12.5 mL/hr over 240 Minutes Intravenous Every 8 hours 05/26/15 0517     05/26/15 0300  cefoTEtan (CEFOTAN) 2 g in dextrose 5 % 50  mL IVPB  Status:  Discontinued     2 g 100 mL/hr over 30 Minutes Intravenous Every 12 hours 05/26/15 0253 05/26/15 0517       Assessment/Plan  POD 0, s/p laparoscopic repair of perforated GJ anastomosis  -discussed smoking cessation with the patient as this is his second perforation since his roux-en-y in 2009 -cont strict NPO.  Plan for swallow study on POD 2 or 3 per Dr. Ezzard StandingNewman -Zosyn D1/7 -mobilize and pulm toilet -labs in am   LOS: 0 days    Makyla Bye E 05/26/2015, 11:37 AM Pager: [phone]

## 2015-05-26 NOTE — Progress Notes (Signed)
Patient requesting new IV access due to IV being in Left AC. RN assessed, and saw limited to no venous access options.   IV team consult placed.

## 2015-05-26 NOTE — Anesthesia Procedure Notes (Signed)
Procedure Name: Intubation Date/Time: 05/26/2015 2:37 AM Performed by: Enriqueta ShutterWILLIFORD, Kapono Luhn D Pre-anesthesia Checklist: Patient identified, Emergency Drugs available, Suction available and Patient being monitored Patient Re-evaluated:Patient Re-evaluated prior to inductionOxygen Delivery Method: Circle System Utilized Preoxygenation: Pre-oxygenation with 100% oxygen Intubation Type: IV induction, Rapid sequence and Cricoid Pressure applied Grade View: Grade II Tube type: Oral Number of attempts: 1 Airway Equipment and Method: Stylet and Oral airway Placement Confirmation: ETT inserted through vocal cords under direct vision,  positive ETCO2 and breath sounds checked- equal and bilateral Secured at: 7.5 cm Tube secured with: Tape Dental Injury: Teeth and Oropharynx as per pre-operative assessment

## 2015-05-26 NOTE — Anesthesia Preprocedure Evaluation (Addendum)
Anesthesia Evaluation  Patient identified by MRN, date of birth, ID band Patient awake    Reviewed: Allergy & Precautions, H&P , NPO status , Patient's Chart, lab work & pertinent test results  Airway Mallampati: II  TM Distance: >3 FB Neck ROM: Full    Dental  (+) Dental Advisory Given, Edentulous Upper, Edentulous Lower   Pulmonary Current Smoker,  breath sounds clear to auscultation  Pulmonary exam normal       Cardiovascular hypertension, negative cardio ROS Normal cardiovascular examRhythm:Regular Rate:Normal     Neuro/Psych PSYCHIATRIC DISORDERS Depression negative neurological ROS  negative psych ROS   GI/Hepatic negative GI ROS, Neg liver ROS, hiatal hernia, PUD, GERD-  Medicated and Controlled,  Endo/Other  negative endocrine ROS  Renal/GU negative Renal ROS  negative genitourinary   Musculoskeletal negative musculoskeletal ROS (+)   Abdominal   Peds negative pediatric ROS (+)  Hematology negative hematology ROS (+)   Anesthesia Other Findings   Reproductive/Obstetrics negative OB ROS                           Anesthesia Physical Anesthesia Plan  ASA: II and emergent  Anesthesia Plan: General   Post-op Pain Management:    Induction: Intravenous, Rapid sequence and Cricoid pressure planned  Airway Management Planned: Oral ETT  Additional Equipment:   Intra-op Plan:   Post-operative Plan: Extubation in OR  Informed Consent:   Plan Discussed with: Surgeon  Anesthesia Plan Comments:        Anesthesia Quick Evaluation

## 2015-05-26 NOTE — ED Notes (Signed)
Bed: WA01 Expected date:  Expected time:  Means of arrival:  Comments[facility]

## 2015-05-27 ENCOUNTER — Inpatient Hospital Stay (HOSPITAL_COMMUNITY): Payer: Medicaid Other

## 2015-05-27 LAB — CBC
HCT: 35.5 % — ABNORMAL LOW (ref 39.0–52.0)
HEMOGLOBIN: 10.9 g/dL — AB (ref 13.0–17.0)
MCH: 25.1 pg — ABNORMAL LOW (ref 26.0–34.0)
MCHC: 30.7 g/dL (ref 30.0–36.0)
MCV: 81.8 fL (ref 78.0–100.0)
Platelets: 177 10*3/uL (ref 150–400)
RBC: 4.34 MIL/uL (ref 4.22–5.81)
RDW: 17.3 % — AB (ref 11.5–15.5)
WBC: 6.9 10*3/uL (ref 4.0–10.5)

## 2015-05-27 LAB — BASIC METABOLIC PANEL
Anion gap: 8 (ref 5–15)
BUN: 8 mg/dL (ref 6–20)
CHLORIDE: 103 mmol/L (ref 101–111)
CO2: 26 mmol/L (ref 22–32)
Calcium: 8.5 mg/dL — ABNORMAL LOW (ref 8.9–10.3)
Creatinine, Ser: 0.77 mg/dL (ref 0.61–1.24)
GFR calc Af Amer: >60 mL/min (ref >60)
Glucose, Bld: 87 mg/dL (ref 65–99)
Potassium: 3.8 mmol/L (ref 3.5–5.1)
Sodium: 137 mmol/L (ref 135–145)

## 2015-05-27 MED ORDER — IOHEXOL 300 MG/ML  SOLN
50.0000 mL | Freq: Once | INTRAMUSCULAR | Status: AC | PRN
  Administered 2015-05-27: 40 mL via ORAL

## 2015-05-27 MED ORDER — NICOTINE 7 MG/24HR TD PT24
7.0000 mg | MEDICATED_PATCH | Freq: Every day | TRANSDERMAL | Status: DC
  Administered 2015-05-27 – 2015-05-30 (×4): 7 mg via TRANSDERMAL
  Filled 2015-05-27 (×4): qty 1

## 2015-05-27 NOTE — Plan of Care (Signed)
Problem: Phase II Progression Outcomes Goal: Tolerating diet Outcome: Progressing Starting clears

## 2015-05-27 NOTE — ED Provider Notes (Signed)
MSE was initiated and I personally evaluated the patient and placed orders (if any) at  5:56 AM on May 27, 2015.  The [facility] with diagnosis of bowel perforation, to be seen and treated by Dr. Ezzard StandingNewman who saw the patient on arrival.   His pain is managed, VSS. Pending confirmation from OR when he will be transported for immediate surgery.  The patient appears stable so that the remainder of the MSE may be completed by another provider.  Elpidio AnisShari Romy Mcgue, PA-C 05/27/15 2022  Rolland PorterMark James, MD 06/01/15 [phone]

## 2015-05-27 NOTE — Progress Notes (Signed)
Utilization review completed.  

## 2015-05-27 NOTE — Progress Notes (Signed)
Patient ID: Tony Davila, male   DOB: [birth year], 34 y.o.   MRN: [id number] [facility] Progress Note:   1 Day Post-Op  Subjective: Mental status is clear.   Objective: Vital signs in last 24 hours: Temp:  [98.2 F (36.8 C)-99.5 F (37.5 C)] 98.3 F (36.8 C) (07/02 0600) Pulse Rate:  [76-101] 78 (07/02 0600) Resp:  [16-18] 18 (07/02 0600) BP: (102-122)/(51-66) 102/51 mmHg (07/02 0600) SpO2:  [95 %-100 %] 95 % (07/02 0600)  Intake/Output from previous day: 07/01 0701 - 07/02 0700 In: 3179.2 [I.V.:3029.2; IV Piggyback:150] Out: 1234 [Urine:1125; Drains:109] Intake/Output this shift:    Physical Exam: Work of breathing is not labored;  JP drain examined and redressed.  Serosanguinous with prior drainage around tube  Lab Results:  Results for orders placed or performed during the hospital encounter of 05/26/15 (from the past 48 hour(s))  CBC     Status: Abnormal   Collection Time: 05/27/15  4:57 AM  Result Value Ref Range   WBC 6.9 4.0 - 10.5 K/uL   RBC 4.34 4.22 - 5.81 MIL/uL   Hemoglobin 10.9 (L) 13.0 - 17.0 g/dL   HCT 35.5 (L) 39.0 - 52.0 %   MCV 81.8 78.0 - 100.0 fL   MCH 25.1 (L) 26.0 - 34.0 pg   MCHC 30.7 30.0 - 36.0 g/dL   RDW 17.3 (H) 11.5 - 15.5 %   Platelets 177 150 - 400 K/uL  Basic metabolic panel     Status: Abnormal   Collection Time: 05/27/15  4:57 AM  Result Value Ref Range   Sodium 137 135 - 145 mmol/L   Potassium 3.8 3.5 - 5.1 mmol/L   Chloride 103 101 - 111 mmol/L   CO2 26 22 - 32 mmol/L   Glucose, Bld 87 65 - 99 mg/dL   BUN 8 6 - 20 mg/dL   Creatinine, Ser 0.77 0.61 - 1.24 mg/dL   Calcium 8.5 (L) 8.9 - 10.3 mg/dL   GFR calc non Af Amer >60 >60 mL/min   GFR calc Af Amer >60 >60 mL/min    Comment: (NOTE) The eGFR has been calculated using the CKD EPI equation. This calculation has not been validated in all clinical situations. eGFR's persistently <60 mL/min signify possible Chronic Kidney Disease.    Anion gap 8 5 - 15     Radiology/Results: No results found.  Anti-infectives: Anti-infectives    Start     Dose/Rate Route Frequency Ordered Stop   05/26/15 0800  piperacillin-tazobactam (ZOSYN) IVPB 3.375 g     3.375 g 12.5 mL/hr over 240 Minutes Intravenous Every 8 hours 05/26/15 0517     05/26/15 0300  cefoTEtan (CEFOTAN) 2 g in dextrose 5 % 50 mL IVPB  Status:  Discontinued     2 g 100 mL/hr over 30 Minutes Intravenous Every 12 hours 05/26/15 0253 05/26/15 0517      Assessment/Plan: Problem List: Patient Active Problem List   Diagnosis Date Noted  . Small bowel perforation 05/26/2015  . Cannabis dependence 06/06/2013  . 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 Varicose veins of leg with pain 02/04/2013  . History of gastric bypass, 01/04/2008. 01/29/2012  . ED (erectile dysfunction) 11/17/2011  . Hypertension 01/07/2011  . Tobacco use disorder 01/07/2011  . Iron deficiency 01/07/2011  . Depression 01/07/2011  . HYPERTENSION 02/19/2008  . GASTROESOPHAGEAL REFLUX DISEASE 05/26/2007    Will get UGI tomorrow;  Nicoderm patch ordered at patient's request  1 Day Post-Op    LOS:  1 day   Matt B. Hassell Done, MD, [facility] [phone] beeper [phone]  05/27/2015 8:35 AM

## 2015-05-28 LAB — CBC
HCT: 33.3 % — ABNORMAL LOW (ref 39.0–52.0)
Hemoglobin: 10.2 g/dL — ABNORMAL LOW (ref 13.0–17.0)
MCH: 24.2 pg — AB (ref 26.0–34.0)
MCHC: 30.6 g/dL (ref 30.0–36.0)
MCV: 79.1 fL (ref 78.0–100.0)
Platelets: 193 10*3/uL (ref 150–400)
RBC: 4.21 MIL/uL — ABNORMAL LOW (ref 4.22–5.81)
RDW: 16.4 % — AB (ref 11.5–15.5)
WBC: 4.9 10*3/uL (ref 4.0–10.5)

## 2015-05-28 LAB — BASIC METABOLIC PANEL
Anion gap: 7 (ref 5–15)
CHLORIDE: 102 mmol/L (ref 101–111)
CO2: 25 mmol/L (ref 22–32)
CREATININE: 0.66 mg/dL (ref 0.61–1.24)
Calcium: 8.7 mg/dL — ABNORMAL LOW (ref 8.9–10.3)
GFR calc Af Amer: >60 mL/min (ref >60)
Glucose, Bld: 88 mg/dL (ref 65–99)
POTASSIUM: 4 mmol/L (ref 3.5–5.1)
Sodium: 134 mmol/L — ABNORMAL LOW (ref 135–145)

## 2015-05-28 MED ORDER — OXYCODONE HCL 5 MG/5ML PO SOLN
5.0000 mg | ORAL | Status: DC | PRN
  Administered 2015-05-28 – 2015-05-30 (×10): 10 mg via ORAL
  Filled 2015-05-28 (×10): qty 10

## 2015-05-28 MED ORDER — UNJURY CHICKEN SOUP POWDER
2.0000 [oz_av] | Freq: Four times a day (QID) | ORAL | Status: DC
  Filled 2015-05-28 (×4): qty 27

## 2015-05-28 MED ORDER — HYDROMORPHONE HCL 2 MG/ML IJ SOLN
0.5000 mg | INTRAMUSCULAR | Status: DC | PRN
  Administered 2015-05-28 – 2015-05-30 (×20): 2 mg via INTRAVENOUS
  Filled 2015-05-28 (×21): qty 1

## 2015-05-28 MED ORDER — UNJURY CHOCOLATE CLASSIC POWDER
2.0000 [oz_av] | Freq: Four times a day (QID) | ORAL | Status: DC
  Filled 2015-05-28 (×4): qty 27

## 2015-05-28 MED ORDER — UNJURY VANILLA POWDER
2.0000 [oz_av] | Freq: Four times a day (QID) | ORAL | Status: DC
  Filled 2015-05-28 (×4): qty 27

## 2015-05-28 NOTE — Progress Notes (Signed)
Pt states pain in left side and at drain site is always at a 7 although he is up walking in room,calm,reading cell phone and watching tv,talking with wife.Asking for Dilaudid 2mg  q 2hrsIV.C/o itching around JP drain insertion site.per pt request removed old gauze with small amt yellowish drainage,cleaned with NS and new drain sponge applied & hyperfix tape.See Deneise LeverSarah Whittemore RN note 05/26/15 Linward HeadlandBeverly, [name] Uecker D

## 2015-05-28 NOTE — Progress Notes (Signed)
2 Days Post-Op  Subjective: C/o of some mild side pain. No n/v. Tolerating clears. Wants to know when can go home  Objective: Vital signs in last 24 hours: Temp:  [97.9 F (36.6 C)-98.6 F (37 C)] 97.9 F (36.6 C) (07/03 0437) Pulse Rate:  [63-80] 63 (07/03 0437) Resp:  [16] 16 (07/03 0437) BP: (112-121)/(62-66) 113/62 mmHg (07/03 0437) SpO2:  [96 %-100 %] 100 % (07/03 0437) Last BM Date: 05/25/15  Intake/Output from previous day: 07/02 0701 - 07/03 0700 In: 4785 [P.O.:1560; I.V.:3125; IV Piggyback:100] Out: 1610 [[id number] [Urine:6825; Drains:62] Intake/Output this shift:    Alert, nontoxic cta  Soft, nd, nontender; drain -serosang  Lab Results:   Recent Labs  05/27/15 0457 05/28/15 0455  WBC 6.9 4.9  HGB 10.9* 10.2*  HCT 35.5* 33.3*  PLT 177 193   BMET  Recent Labs  05/27/15 0457 05/28/15 0455  NA 137 134*  K 3.8 4.0  CL 103 102  CO2 26 25  GLUCOSE 87 88  BUN 8 <5*  CREATININE 0.77 0.66  CALCIUM 8.5* 8.7*   PT/INR No results for input(s): LABPROT, INR in the last 72 hours. ABG No results for input(s): PHART, HCO3 in the last 72 hours.  Invalid input(s): PCO2, PO2  Studies/Results: Dg Ugi W/water Sol Cm  05/27/2015   CLINICAL DATA:  34 year old with a remote gastric bypass procedure and recent surgical repair of a perforation at the gastrojejunostomy.  EXAM: WATER SOLUBLE UPPER GI SERIES  TECHNIQUE: Single-column upper GI series was performed using water soluble contrast.  CONTRAST:  40mL OMNIPAQUE IOHEXOL 300 MG/ML  SOLN  COMPARISON:  CT 05/25/2015  FLUOROSCOPY TIME:  Fluoroscopy Time (in minutes and seconds): 2 minutes and 14 second  FINDINGS: The scout image demonstrates residual oral contrast in the colon. There is a surgical drain in the left abdomen. Surgical clips in the upper abdomen.  The patient drank water-soluble contrast in a semi upright position. Contrast rapidly went down the esophagus and into the stomach. Contrast rapidly went through the  gastrojejunostomy and there was no evidence for an anastomotic leak. Mild distension of the jejunum near the anastomosis. Contrast advanced further into the small bowel once the patient changed positioning. The small bowel anatomy near the gastrojejunostomy appears complex which could be related to overlapping loops of small bowel.  IMPRESSION: The gastrojejunostomy is patent.  No evidence for a bowel leak.   Electronically Signed   By: Richarda OverlieAdam  Henn M.D.   On: 05/27/2015 13:04    Anti-infectives: Anti-infectives    Start     Dose/Rate Route Frequency Ordered Stop   05/26/15 0800  piperacillin-tazobactam (ZOSYN) IVPB 3.375 g     3.375 g 12.5 mL/hr over 240 Minutes Intravenous Every 8 hours 05/26/15 0517     05/26/15 0300  cefoTEtan (CEFOTAN) 2 g in dextrose 5 % 50 mL IVPB  Status:  Discontinued     2 g 100 mL/hr over 30 Minutes Intravenous Every 12 hours 05/26/15 0253 05/26/15 0517      Assessment/Plan: Perforated marginal ulcer, h/o RYGB s/p Procedure(s): DIAGNOSTIC LAPAROSCOPY WITH OVERSEW OF GASTRIC DUODENAL ULCER WITH OMENTAL PATCH (N/A)  Cont IV abx for now Will let have protein shake No evidence of leak Discussed how important smoking and NSAID cessation is Advised pt he will be on a liquid diet for 2 weeks - he will not go home on solid food diet.  Add oral pain medication  Mary Sellaric M. Andrey CampanileWilson, MD, FACS General, Bariatric, & Minimally Invasive [facility]   LOS: 2 days    Tony Davila 05/28/2015

## 2015-05-29 NOTE — Progress Notes (Signed)
Patient ID: Tony Davila, male   DOB: 06/12/1981, 34 y.o.   MRN: [id number] [facility] Progress Note:   3 Days Post-Op  Subjective: Mental status is alert and not having any pain.  Main complaint is leakage arount the JP tube.   Objective: Vital signs in last 24 hours: Temp:  [98 F (36.7 C)-98.6 F (37 C)] 98.4 F (36.9 C) (07/04 0530) Pulse Rate:  [69-73] 70 (07/04 0530) Resp:  [16-18] 18 (07/04 0530) BP: (125-129)/(65-77) 127/70 mmHg (07/04 0530) SpO2:  [100 %] 100 % (07/04 0530)  Intake/Output from previous day: 07/03 0701 - 07/04 0700 In: 2403.3 [P.O.:240; I.V.:1993.3; IV Piggyback:150] Out: 8756 [Urine:3750; Drains:120] Intake/Output this shift: Total I/O In: -  Out: 530 [Urine:500; Drains:30]  Physical Exam: Work of breathing is not labored.  Taking clears ok  Lab Results:  Results for orders placed or performed during the hospital encounter of 05/26/15 (from the past 48 hour(s))  CBC     Status: Abnormal   Collection Time: 05/28/15  4:55 AM  Result Value Ref Range   WBC 4.9 4.0 - 10.5 K/uL   RBC 4.21 (L) 4.22 - 5.81 MIL/uL   Hemoglobin 10.2 (L) 13.0 - 17.0 g/dL   HCT 33.3 (L) 39.0 - 52.0 %   MCV 79.1 78.0 - 100.0 fL   MCH 24.2 (L) 26.0 - 34.0 pg   MCHC 30.6 30.0 - 36.0 g/dL   RDW 16.4 (H) 11.5 - 15.5 %   Platelets 193 150 - 400 K/uL  Basic metabolic panel     Status: Abnormal   Collection Time: 05/28/15  4:55 AM  Result Value Ref Range   Sodium 134 (L) 135 - 145 mmol/L   Potassium 4.0 3.5 - 5.1 mmol/L   Chloride 102 101 - 111 mmol/L   CO2 25 22 - 32 mmol/L   Glucose, Bld 88 65 - 99 mg/dL   BUN <5 (L) 6 - 20 mg/dL   Creatinine, Ser 0.66 0.61 - 1.24 mg/dL   Calcium 8.7 (L) 8.9 - 10.3 mg/dL   GFR calc non Af Amer >60 >60 mL/min   GFR calc Af Amer >60 >60 mL/min    Comment: (NOTE) The eGFR has been calculated using the CKD EPI equation. This calculation has not been validated in all clinical situations. eGFR's persistently <60 mL/min signify  possible Chronic Kidney Disease.    Anion gap 7 5 - 15    Radiology/Results: Dg Ugi W/water Sol Cm  05/27/2015   CLINICAL DATA:  34 year old with a remote gastric bypass procedure and recent surgical repair of a perforation at the gastrojejunostomy.  EXAM: WATER SOLUBLE UPPER GI SERIES  TECHNIQUE: Single-column upper GI series was performed using water soluble contrast.  CONTRAST:  46m OMNIPAQUE IOHEXOL 300 MG/ML  SOLN  COMPARISON:  CT 05/25/2015  FLUOROSCOPY TIME:  Fluoroscopy Time (in minutes and seconds): 2 minutes and 14 second  FINDINGS: The scout image demonstrates residual oral contrast in the colon. There is a surgical drain in the left abdomen. Surgical clips in the upper abdomen.  The patient drank water-soluble contrast in a semi upright position. Contrast rapidly went down the esophagus and into the stomach. Contrast rapidly went through the gastrojejunostomy and there was no evidence for an anastomotic leak. Mild distension of the jejunum near the anastomosis. Contrast advanced further into the small bowel once the patient changed positioning. The small bowel anatomy near the gastrojejunostomy appears complex which could be related to overlapping loops of small bowel.  IMPRESSION: The gastrojejunostomy is patent.  No evidence for a bowel leak.   Electronically Signed   By: Markus Daft M.D.   On: 05/27/2015 13:04    Anti-infectives: Anti-infectives    Start     Dose/Rate Route Frequency Ordered Stop   05/26/15 0800  piperacillin-tazobactam (ZOSYN) IVPB 3.375 g     3.375 g 12.5 mL/hr over 240 Minutes Intravenous Every 8 hours 05/26/15 0517     05/26/15 0300  cefoTEtan (CEFOTAN) 2 g in dextrose 5 % 50 mL IVPB  Status:  Discontinued     2 g 100 mL/hr over 30 Minutes Intravenous Every 12 hours 05/26/15 0253 05/26/15 0517      Assessment/Plan: Problem List: Patient Active Problem List   Diagnosis Date Noted  . Small bowel perforation 05/26/2015  . Cannabis dependence 06/06/2013  .  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 Varicose veins of leg with pain 02/04/2013  . History of gastric bypass, 01/04/2008. 01/29/2012  . ED (erectile dysfunction) 11/17/2011  . Hypertension 01/07/2011  . Tobacco use disorder 01/07/2011  . Iron deficiency 01/07/2011  . Depression 01/07/2011  . HYPERTENSION 02/19/2008  . GASTROESOPHAGEAL REFLUX DISEASE 05/26/2007    Advance to bariatric full and discontinue JP.   3 Days Post-Op    LOS: 3 days   Matt B. Hassell Done, MD, [facility] [phone] beeper [phone]  05/29/2015 9:51 AM

## 2015-05-30 ENCOUNTER — Encounter (HOSPITAL_COMMUNITY): Payer: Self-pay | Admitting: Surgery

## 2015-05-30 MED ORDER — OMEPRAZOLE 40 MG PO CPDR: 40.0000 mg | DELAYED_RELEASE_CAPSULE | Freq: Two times a day (BID) | ORAL | Status: DC

## 2015-05-30 MED ORDER — OXYCODONE-ACETAMINOPHEN 5-325 MG PO TABS: 1.0000 | ORAL_TABLET | Freq: Four times a day (QID) | ORAL | Status: DC | PRN

## 2015-05-30 MED ORDER — SUCRALFATE 1 GM/10ML PO SUSP: 1.0000 g | Freq: Three times a day (TID) | ORAL | Status: DC

## 2015-05-30 NOTE — Discharge Summary (Signed)
Physician Discharge Summary  Tony PostinJeffery A Davila ZOX:[id number] DOB: 1981-11-02 DOA: 05/26/2015  PCP: Kristian CoveyBURCHETTE,BRUCE W, MD  Consultation: none  Admit date: 05/26/2015 Discharge date: 05/30/2015  Recommendations for Outpatient Follow-up:   Follow-up Information    [facility]   Specialty:  General Surgery   Why:  As needed   Contact information:   [address] [place] [address] [phone]      Discharge Diagnoses:  1. Pneumoperitoneum   Surgical Procedure: laparoscopic exploration with over-sew of gastrojejunal perforation and omental patch.---Dr. Ezzard StandingNewman 05/26/15  Discharge Condition: stable Disposition: home  Diet recommendation: bariatric   Filed Weights   05/26/15 0520  Weight: 72.576 kg (160 lb)       Hospital Course:  Tony DibbleJeffery Davila is a 34 year old male with a history of tobacco use, bipolar depression, RYGB by Dr. Johna SheriffHoxworth, jejunal ulcer [facility] showed [facility].  He underwent the procedure listed above.  He completed zosyn post op. JP drain was removed on POD#3.  He had an UGI which did not show a leak and diet was advanced, told to continue bariatric full liquid diet for 2 weeks, then as tolerated.  He remained stable.  On POD#4 the patient was felt stable for discharge.  Given Rx for carafate and PPI and advised to follow up with PCP for further refills.  He will be going back to [place] and was strongly encouraged to follow up with his bariatric surgeon in 2-3 weeks. encouraged to stop smoking.  Medication risks, benefits and therapeutic alternatives were reviewed with the patient. He verbalizes understanding.    Physical Exam: General appearance: alert and oriented. Calm and cooperative No acute distress. VSS. Afebrile.  Resp: clear to auscultation bilaterally  Cardio: S1S1 RRR without murmurs or gallops. No edema. GI: soft round and nontender. +BS x4 quadrants. No  organomegaly, hernias or masses. Incisions are c/d//i.  Pulses: +2 bilateral distal pulses without cyanosis     Discharge Instructions     Medication List    STOP taking these medications        azithromycin 250 MG tablet  Commonly known as:  ZITHROMAX     ibuprofen 200 MG tablet  Commonly known as:  ADVIL,MOTRIN      TAKE these medications        omeprazole 40 MG capsule  Commonly known as:  PRILOSEC  Take 1 capsule (40 mg total) by mouth 2 (two) times daily before a meal.     oxyCODONE-acetaminophen 5-325 MG per tablet  Commonly known as:  ROXICET  Take 1-2 tablets by mouth every 6 (six) hours as needed for severe pain.     sucralfate 1 GM/10ML suspension  Commonly known as:  CARAFATE  Take 10 mLs (1 g total) by mouth 4 (four) times daily -  with meals and at bedtime.           Follow-up Information    [facility]   Specialty:  General Surgery   Why:  As needed   Contact information:   [address] [place] [address] [phone]        The results of significant diagnostics from this hospitalization (including imaging, microbiology, ancillary and laboratory) are listed below for reference.    Significant Diagnostic Studies: Dg Ugi W/water Sol Cm  05/27/2015   CLINICAL DATA:  34 year old with a  remote gastric bypass procedure and recent surgical repair of a perforation at the gastrojejunostomy.  EXAM: WATER SOLUBLE UPPER GI SERIES  TECHNIQUE: Single-column upper GI series was performed using water soluble contrast.  CONTRAST:  40mL OMNIPAQUE IOHEXOL 300 MG/ML  SOLN  COMPARISON:  CT 05/25/2015  FLUOROSCOPY TIME:  Fluoroscopy Time (in minutes and seconds): 2 minutes and 14 second  FINDINGS: The scout image demonstrates residual oral contrast in the colon. There is a surgical drain in the left abdomen. Surgical clips in the upper abdomen.  The patient drank water-soluble contrast in a semi upright position. Contrast rapidly  went down the esophagus and into the stomach. Contrast rapidly went through the gastrojejunostomy and there was no evidence for an anastomotic leak. Mild distension of the jejunum near the anastomosis. Contrast advanced further into the small bowel once the patient changed positioning. The small bowel anatomy near the gastrojejunostomy appears complex which could be related to overlapping loops of small bowel.  IMPRESSION: The gastrojejunostomy is patent.  No evidence for a bowel leak.   Electronically Signed   By: Richarda Overlie M.D.   On: 05/27/2015 13:04    Microbiology: No results found for this or any previous visit (from the past 240 hour(s)).   Labs: Basic Metabolic Panel:  Recent Labs Lab 05/27/15 0457 05/28/15 0455  NA 137 134*  K 3.8 4.0  CL 103 102  CO2 26 25  GLUCOSE 87 88  BUN 8 <5*  CREATININE 0.77 0.66  CALCIUM 8.5* 8.7*   Liver Function Tests: No results for input(s): AST, ALT, ALKPHOS, BILITOT, PROT, ALBUMIN in the last 168 hours. No results for input(s): LIPASE, AMYLASE in the last 168 hours. No results for input(s): AMMONIA in the last 168 hours. CBC:  Recent Labs Lab 05/27/15 0457 05/28/15 0455  WBC 6.9 4.9  HGB 10.9* 10.2*  HCT 35.5* 33.3*  MCV 81.8 79.1  PLT 177 193   Cardiac Enzymes: No results for input(s): CKTOTAL, CKMB, CKMBINDEX, TROPONINI in the last 168 hours. BNP: BNP (last 3 results) No results for input(s): BNP in the last 8760 hours.  ProBNP (last 3 results) No results for input(s): PROBNP in the last 8760 hours.  CBG: No results for input(s): GLUCAP in the last 168 hours.  Active Problems:   Small bowel perforation   Time coordinating discharge: <30 mins  Signed:  Rudine Rieger, ANP-BC

## 2015-05-30 NOTE — Discharge Instructions (Signed)

## 2016-04-19 ENCOUNTER — Encounter: Payer: Self-pay | Admitting: Gastroenterology

## 2016-05-10 ENCOUNTER — Ambulatory Visit: Payer: Self-pay | Admitting: Gastroenterology

## 2016-05-30 ENCOUNTER — Ambulatory Visit: Payer: 59 | Admitting: Neurology

## 2016-05-30 ENCOUNTER — Telehealth: Payer: Self-pay | Admitting: *Deleted

## 2016-05-30 NOTE — Telephone Encounter (Signed)
no showed new patient appt 

## 2016-05-31 ENCOUNTER — Encounter: Payer: Self-pay | Admitting: Neurology

## 2016-06-19 ENCOUNTER — Other Ambulatory Visit: Payer: Self-pay

## 2016-06-19 ENCOUNTER — Ambulatory Visit (INDEPENDENT_AMBULATORY_CARE_PROVIDER_SITE_OTHER): Payer: 59 | Admitting: Gastroenterology

## 2016-06-19 ENCOUNTER — Encounter: Payer: Self-pay | Admitting: Gastroenterology

## 2016-06-19 VITALS — BP 117/73 | HR 65 | Temp 97.1°F | Ht 67.0 in | Wt 141.4 lb

## 2016-06-19 DIAGNOSIS — D509 Iron deficiency anemia, unspecified: Secondary | ICD-10-CM

## 2016-06-19 DIAGNOSIS — Z9884 Bariatric surgery status: Secondary | ICD-10-CM

## 2016-06-19 DIAGNOSIS — Z9889 Other specified postprocedural states: Secondary | ICD-10-CM | POA: Diagnosis not present

## 2016-06-19 DIAGNOSIS — R634 Abnormal weight loss: Secondary | ICD-10-CM | POA: Diagnosis not present

## 2016-06-19 DIAGNOSIS — E611 Iron deficiency: Secondary | ICD-10-CM

## 2016-06-19 DIAGNOSIS — R112 Nausea with vomiting, unspecified: Secondary | ICD-10-CM

## 2016-06-19 MED ORDER — LINACLOTIDE 72 MCG PO CAPS: 72.0000 ug | ORAL_CAPSULE | Freq: Every day | ORAL | 3 refills | Status: DC

## 2016-06-19 MED ORDER — OMEPRAZOLE 40 MG PO CPDR: 40.0000 mg | DELAYED_RELEASE_CAPSULE | Freq: Two times a day (BID) | ORAL | 1 refills | Status: DC

## 2016-06-19 MED ORDER — PEG 3350-KCL-NA BICARB-NACL 420 G PO SOLR: 4000.0000 mL | ORAL | 0 refills | Status: DC

## 2016-06-19 MED ORDER — ONDANSETRON 4 MG PO TBDP: 4.0000 mg | ORAL_TABLET | Freq: Three times a day (TID) | ORAL | 0 refills | Status: DC | PRN

## 2016-06-19 MED ORDER — ONDANSETRON 4 MG PO TBDP: 4.0000 mg | ORAL_TABLET | Freq: Three times a day (TID) | ORAL | 0 refills | Status: AC | PRN

## 2016-06-19 NOTE — Progress Notes (Addendum)
REVIEWED-NO ADDITIONAL RECOMMENDATIONS.  Primary Care Physician:  Jobe Marker  Primary Gastroenterologist:  Jonette Eva, MD   Chief Complaint  Patient presents with  . Constipation  . Nausea    HPI:  Tony Davila is a 35 y.o. male here at the request of Dr. Selinda Flavin for further evaluation of anemia, weight loss, peptic ulcer disease. Patient has complicated GI history. Patient underwent gastric bypass (Roux-en-Y) surgery in 2009 by Dr. Johna Sheriff. He lost over 200 pounds. Unfortunately he has had problems with recurrent anastomotic ulcer disease. Initially found about 6 months after his surgery. He had perforated gastrojejunal ulcer November 2013 and underwent laparoscopic oversew per Dr. Ezzard Standing. He saw Dr. Leone Payor in May 2014 and underwent an EGD which found an 8 x 8 mm anastomotic ulcer on the jejunal side. Biopsy showed chronically inflamed and focally ulcerated small bowel mucosa. He has been treated with PPI twice a day, Carafate, Cytotec. He returned for EGD to document healing in July 2014, the ulcer had healed but there was some scarring in the jejunum. Cytotec had to be discontinued because of headaches. Patient reports having GI bleed due to ulcer in 2015 when he was living in [place]. Also states he was supposed to have a colonoscopy for iron deficiency anemia but it was canceled because he was not clean. Patient's course further complicated by perforation in July 2016 requiring diagnostic laparoscopy with oversew of gastric duodenal ulcer with omental patch by Dr. Ezzard Standing.   Patient states that his weight typically is anywhere from 150s to 160 pounds usually. He is down 140 now. He has nausea, bloating, constipation. Currently taking MiraLAX over-the-counter agents. May take 4-6 Dulcolax and take up to 2 days later to have a bowel movement. MiraLAX does not seem to help. Omeprazole for the most part controls his heartburn. He rarely has esophageal dysphagia. Gets very hard  and uncomfortable. Rarely takes ibuprofen. Denies NSAID or aspirin use. Patient denies melena rectal bleeding.     Current Outpatient Prescriptions  Medication Sig Dispense Refill  . divalproex (DEPAKOTE) 250 MG DR tablet     . ferrous sulfate 325 (65 FE) MG tablet Take 325 mg by mouth 2 (two) times daily.  2  . omeprazole (PRILOSEC) 40 MG capsule Take 1 capsule (40 mg total) by mouth 2 (two) times daily before a meal. (Patient taking differently: Take 40 mg by mouth 2 (two) times daily. ) 60 capsule 1  . sucralfate (CARAFATE) 1 GM/10ML suspension Take 10 mLs (1 g total) by mouth 4 (four) times daily -  with meals and at bedtime. 420 mL 1   No current facility-administered medications for this visit.     Allergies as of 06/19/2016  . (No Known Allergies)    Past Medical History:  Diagnosis Date  . Acute gastrojejunal anastomotic ulcer 03/2013  . Allergy   . Anemia   . Asthma    as a child  . Bipolar disorder (HCC)   . Depression   . 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 GERD (gastroesophageal reflux disease)   . Hypertension    not tx for now  . Jejunal ulcer 2009   anastomotic ulcer after gastric bypass  . Jejunal ulcer 2009   anastomotic  . Kidney stone   . 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 Varicose vein     Past Surgical History:  Procedure Laterality Date  . ESOPHAGOGASTRODUODENOSCOPY  04/12/2013   Dr.  Gessner: Single ulcer measuring 8 x 8 mm found at the anastomosis on the jejunal side, gastric enteroenterostomy was found in the gastric body. Biopsy benign.  . ESOPHAGOGASTRODUODENOSCOPY  05/31/2013   Dr. Leone Payor: Single small scar on the jejunal side of the gastroenterostomy, ulcer healed.  . ESOPHAGOGASTRODUODENOSCOPY  08/10/2013   Medium-sized ulcer found in the jejunum at the gastrojejunal anastomosis, status post gastric bypass and oversew perforated ulcer.  . ESOPHAGOGASTRODUODENOSCOPY  03/21/2008   1-2 cm hiatal hernia  .  GASTRIC BYPASS  2009   Dr. Johna Sheriff - Roux en Y  . GASTRIC ROUX-EN-Y N/A 05/26/2015   Procedure: DIAGNOSTIC LAPAROSCOPY WITH OVERSEW OF GASTRIC DUODENAL ULCER WITH OMENTAL PATCH;  Surgeon: Ovidio Kin, MD;  Location: [facility];  Service: General;  Laterality: N/A;  . LAPAROSCOPY  01/17/2012   over-sew of perforated gastrojejunal ulcer  . TONSILLECTOMY  Age 25  . UPPER GASTROINTESTINAL ENDOSCOPY  02/19/2011   normal post-op exam s/p gastric bypass  . WISDOM TOOTH EXTRACTION  Age 20    Family History  Problem Relation Age of Onset  . Irritable bowel syndrome Father   . Alcohol abuse Father   . Drug abuse Father   . Irritable bowel syndrome Brother   . Cystic fibrosis Mother     patient denies, 06/21/2016  . Drug abuse Mother   . Alcohol abuse Mother   . Heart disease Maternal Grandmother   . Hyperlipidemia Maternal Grandmother   . Hypertension Maternal Grandmother   . Mental illness Maternal Grandmother   . Diabetes Maternal Grandmother   . Bipolar disorder Brother   . Alcohol abuse Paternal Uncle   . Drug abuse Paternal Uncle   . Colon cancer Neg Hx   . Stomach cancer Neg Hx   . Esophageal cancer Neg Hx   . Rectal cancer Neg Hx     Social History   Social History  . Marital status: Married    Spouse name: N/A  . Number of children: 0  . Years of education: N/A   Occupational History  . [employer]   Social History Main Topics  . Smoking status: Current Every Day Smoker    Packs/day: 1.00    Years: 12.00    Types: Cigarettes  . Smokeless tobacco: Current User     Comment: less than 1/2 ppd.  . Alcohol use No  . Drug use: No     Comment: vicodan, percocet 70 DAYS OFF DRUGS  . Sexual activity: Yes   Other Topics Concern  . Not on file   Social History Narrative   06/04/2013 [facility]  [name] was born in [place], [place]. He has a half-brother and half-sister, both younger. His parents divorced when he was [age], and he was raised by his maternal grandmother and  grandfather. He reports that his mother was not present during his childhood as she was dating men. [name] did not meet his biological father again until he was 22 years of age. He graduated from high school, and has completed some college. He currently works for [employer]. He has been married for 9 years, and has no children. He currently lives with his wife and 63 year old grandmother, for whom he and his wife care do to her dementia, CHF, and COPD. He denies any legal problems. He reports that he is agnostic. His hobbies include fishing. His social support system consists of his wife. 06/04/2013 [facility]        ROS:  General: Negative for anorexia,  chills, fatigue, weakness.see hpi. Eyes: Negative for vision changes.  ENT: Negative for hoarseness, difficulty swallowing , nasal congestion. CV: Negative for chest pain, angina, palpitations, dyspnea on exertion, peripheral edema.  Respiratory: Negative for dyspnea at rest, dyspnea on exertion, cough, sputum, wheezing.  GI: See history of present illness. GU:  Negative for dysuria, hematuria, urinary incontinence, urinary frequency, nocturnal urination.  MS: Negative for joint pain, low back pain.  Derm: Negative for rash or itching.  Neuro: Negative for weakness, abnormal sensation, seizure, frequent headaches, memory loss, confusion.  Psych: Negative for anxiety, depression, suicidal ideation, hallucinations.  Endo: Negative for unusual weight change.  Heme: Negative for bruising or bleeding. Allergy: Negative for rash or hives.    Physical Examination:  BP 117/73   Pulse 65   Temp 97.1 F (36.2 C) (Oral)   Ht  (1.702 m)   Wt 141 lb 6.4 oz (64.1 kg)   BMI 22.15 kg/m    General: Well-nourished, well-developed in no acute distress. Accompanied by wife. Head: Normocephalic, atraumatic.   Eyes: Conjunctiva pink, no icterus. Mouth: Oropharyngeal mucosa moist and pink , no lesions erythema or exudate. Neck: Supple without thyromegaly,  masses, or lymphadenopathy.  Lungs: Clear to auscultation bilaterally.  Heart: Regular rate and rhythm, no murmurs rubs or gallops.  Abdomen: Bowel sounds are normal, nontender, nondistended, no hepatosplenomegaly or masses, no abdominal bruits or    hernia , no rebound or guarding.  Multiple well-healed incisions Rectal: Not performed Extremities: No lower extremity edema. No clubbing or deformities.  Neuro: Alert and oriented x 4 , grossly normal neurologically.  Skin: Warm and dry, no rash or jaundice.   Psych: Alert and cooperative, normal mood and affect.  Labs:April 2017 labs TIBC 468 high, iron 20 low, iron saturations 4% low, B12 280, folate 5, BUN 7, creatinine 0.58, albumin 4, total bilirubin 0.3, alkaline phosphatase 101, AST 14, ALT 11, albumin 4, white blood cell count 4800, hemoglobin 10.8, hematocrit 32.8, MCV 82, platelets 234,000, ANA negative, RA less than 10, sedimentation rate 2, TSH 1.4  Imaging Studies: No results found.

## 2016-06-19 NOTE — Patient Instructions (Addendum)
1. Upper endoscopy and colonoscopy as scheduled. See separate instructions.  2. RX for zofran for nausea as needed. 3. RX for omeprazole 40mg  twice daily sent to pharmacy. 4. RX for Linzess daily on empty stomach for constipation. 5. Prior to your colonoscopy, you will need to take Linzess 145 mcg daily for one week to get your bowels moving. This is stronger than the RX I gave you to be on chronically, but we have provided you with samples to take the week before your procedure.

## 2016-06-21 ENCOUNTER — Encounter: Payer: 59 | Admitting: Neurology

## 2016-06-21 ENCOUNTER — Encounter: Payer: Self-pay | Admitting: Gastroenterology

## 2016-06-21 ENCOUNTER — Telehealth: Payer: Self-pay | Admitting: *Deleted

## 2016-06-21 NOTE — Assessment & Plan Note (Signed)
35 year old gentleman with history of iron deficiency anemia, constipation, ongoing weight loss. Past medical history significant for Roux-en-Y gastric bypass in 2009. She lost over 200 pounds since that time. Course complicated by recurrent anastomotic ulcers as well as perforation requiring 2 surgical repairs. He has had multiple endoscopies as outlined above. No prior colonoscopy.  Discussed with patient. Given iron deficiency anemia he needs to have a colonoscopy. He is also having further weight loss, nausea and given his history of complicated ulcer disease would advise upper endoscopy. We will work on bowel regimen for chronic constipation. We'll refill his prescription for omeprazole 40 mg twice a day. He states he's having difficulty with his insurance company. May have to fill out some paperwork or switch to another regimen such as pantoprazole 40 mg twice a day. Add Linzess 72 g daily chronically but prior to colonoscopy he will take 145 g daily for 1 week.   EGD/tCS with deep sedation in the OR to ensure adequate sedation  I have discussed the risks, alternatives, benefits with regards to but not limited to the risk of reaction to medication, bleeding, infection, perforation and the patient is agreeable to proceed. Written consent to be obtained.

## 2016-06-21 NOTE — Telephone Encounter (Signed)
no showed EMG/NCS 

## 2016-06-24 ENCOUNTER — Encounter: Payer: Self-pay | Admitting: Neurology

## 2016-06-24 ENCOUNTER — Telehealth: Payer: Self-pay

## 2016-06-24 NOTE — Telephone Encounter (Signed)
Pt is calling to cancel his TCS because he does not have anytime to take off for his prep-op. He will call us back when he has the time to take off.

## 2016-06-25 ENCOUNTER — Encounter: Payer: Self-pay | Admitting: Neurology

## 2016-06-25 NOTE — Progress Notes (Signed)
CC'ED TO PCP 

## 2016-06-28 ENCOUNTER — Other Ambulatory Visit (HOSPITAL_COMMUNITY): Payer: Self-pay

## 2016-07-01 ENCOUNTER — Other Ambulatory Visit: Payer: Self-pay

## 2016-07-01 DIAGNOSIS — K219 Gastro-esophageal reflux disease without esophagitis: Secondary | ICD-10-CM

## 2016-07-01 DIAGNOSIS — D509 Iron deficiency anemia, unspecified: Secondary | ICD-10-CM

## 2016-07-01 NOTE — Telephone Encounter (Signed)
Noted. Looks like he is rescheduled now.

## 2016-07-02 ENCOUNTER — Ambulatory Visit (HOSPITAL_COMMUNITY): Admission: RE | Admit: 2016-07-02 | Payer: 59 | Source: Ambulatory Visit | Admitting: Gastroenterology

## 2016-07-02 ENCOUNTER — Encounter (HOSPITAL_COMMUNITY): Admission: RE | Payer: Self-pay | Source: Ambulatory Visit

## 2016-07-02 SURGERY — COLONOSCOPY WITH PROPOFOL
Anesthesia: Monitor Anesthesia Care

## 2016-07-04 ENCOUNTER — Encounter: Payer: Self-pay | Admitting: Gastroenterology

## 2016-07-04 NOTE — Patient Instructions (Signed)
20    Your procedure is scheduled on: 07/09/2016  Report to [facility] at 8:15     AM.  Call this number if you have problems the morning of surgery: [phone]   Remember:   Do not drink or eat food:After Midnight.    Clear liquids include soda, tea, black coffee, apple or grape juice, broth.  Take these medicines the morning of surgery with A SIP OF WATER: Omeprazole, Depakote and zofran if needed   Do not wear jewelry, make-up or nail polish.  Do not wear lotions, powders, or perfumes. You may wear deodorant.  Do not shave 48 hours prior to surgery. Men may shave face and neck.  Do not bring valuables to the hospital.  Contacts, dentures or bridgework may not be worn into surgery.  Leave suitcase in the car. After surgery it may be brought to your room.  For patients admitted to the hospital, checkout time is 11:00 AM the day of discharge.   Patients discharged the day of surgery will not be allowed to drive home.  Name and phone number of your driver:    Please read over the following fact sheets that you were given: Pain Booklet, Lab Information and Anesthesia Post-op Instructions   Endoscopy Care After Please read the instructions outlined below and refer to this sheet in the next few weeks. These discharge instructions provide you with general information on caring for yourself after you leave the hospital. Your doctor may also give you specific instructions. While your treatment has been planned according to the most current medical practices available, unavoidable complications occasionally occur. If you have any problems or questions after discharge, please call your doctor. HOME CARE INSTRUCTIONS Activity  You may resume your regular activity but move at a slower pace for the next 24 hours.   Take frequent rest periods for the next 24 hours.   Walking will help expel (get rid of) the air and reduce the bloated feeling in your abdomen.   No driving for 24 hours (because  of the anesthesia (medicine) used during the test).   You may shower.   Do not sign any important legal documents or operate any machinery for 24 hours (because of the anesthesia used during the test).  Nutrition  Drink plenty of fluids.   You may resume your normal diet.   Begin with a light meal and progress to your normal diet.   Avoid alcoholic beverages for 24 hours or as instructed by your caregiver.  Medications You may resume your normal medications unless your caregiver tells you otherwise. What you can expect today  You may experience abdominal discomfort such as a feeling of fullness or "gas" pains.   You may experience a sore throat for 2 to 3 days. This is normal. Gargling with salt water may help this.  Follow-up Your doctor will discuss the results of your test with you. SEEK IMMEDIATE MEDICAL CARE IF:  You have excessive nausea (feeling sick to your stomach) and/or vomiting.   You have severe abdominal pain and distention (swelling).   You have trouble swallowing.   You have a temperature over 100 F (37.8 C).   You have rectal bleeding or vomiting of blood.  Document Released: 06/25/2004 Document Revised: 10/31/2011 Document Reviewed: 01/06/2008 Colonoscopy, Care After Refer to this sheet in the next few weeks. These instructions provide you with information on caring for yourself after your procedure. Your health care provider may also give you more specific instructions.  Your treatment has been planned according to current medical practices, but problems sometimes occur. Call your health care provider if you have any problems or questions after your procedure. WHAT TO EXPECT AFTER THE PROCEDURE  After your procedure, it is typical to have the following:  A small amount of blood in your stool.  Moderate amounts of gas and mild abdominal cramping or bloating. HOME CARE INSTRUCTIONS  Do not drive, operate machinery, or sign important documents for 24  hours.  You may shower and resume your regular physical activities, but move at a slower pace for the first 24 hours.  Take frequent rest periods for the first 24 hours.  Walk around or put a warm pack on your abdomen to help reduce abdominal cramping and bloating.  Drink enough fluids to keep your urine clear or pale yellow.  You may resume your normal diet as instructed by your health care provider. Avoid heavy or fried foods that are hard to digest.  Avoid drinking alcohol for 24 hours or as instructed by your health care provider.  Only take over-the-counter or prescription medicines as directed by your health care provider.  If a tissue sample (biopsy) was taken during your procedure:  Do not take aspirin or blood thinners for 7 days, or as instructed by your health care provider.  Do not drink alcohol for 7 days, or as instructed by your health care provider.  Eat soft foods for the first 24 hours. SEEK MEDICAL CARE IF: You have persistent spotting of blood in your stool 2-3 days after the procedure. SEEK IMMEDIATE MEDICAL CARE IF:  You have more than a small spotting of blood in your stool.  You pass large blood clots in your stool.  Your abdomen is swollen (distended).  You have nausea or vomiting.  You have a fever.  You have increasing abdominal pain that is not relieved with medicine.   This information is not intended to replace advice given to you by your health care provider. Make sure you discuss any questions you have with your health care provider.   Document Released: 06/25/2004 Document Revised: 09/01/2013 Document Reviewed: 07/19/2013 Elsevier Interactive Patient Education [employer]2016 Elsevier Inc.

## 2016-07-05 ENCOUNTER — Encounter: Payer: Self-pay | Admitting: Gastroenterology

## 2016-07-05 ENCOUNTER — Encounter (HOSPITAL_COMMUNITY)
Admission: RE | Admit: 2016-07-05 | Discharge: 2016-07-05 | Disposition: A | Discharge status: Home / Self Care | Payer: 59 | Source: Ambulatory Visit | Attending: Gastroenterology | Admitting: Gastroenterology

## 2016-07-05 ENCOUNTER — Encounter (HOSPITAL_COMMUNITY): Payer: Self-pay

## 2016-07-05 DIAGNOSIS — Z01812 Encounter for preprocedural laboratory examination: Secondary | ICD-10-CM | POA: Insufficient documentation

## 2016-07-05 HISTORY — DX: Personal history of urinary calculi: Z87.442

## 2016-07-05 LAB — BASIC METABOLIC PANEL
ANION GAP: 4 — AB (ref 5–15)
BUN: 9 mg/dL (ref 6–20)
CHLORIDE: 103 mmol/L (ref 101–111)
CO2: 29 mmol/L (ref 22–32)
CREATININE: 0.6 mg/dL — AB (ref 0.61–1.24)
Calcium: 8.6 mg/dL — ABNORMAL LOW (ref 8.9–10.3)
GFR calc non Af Amer: >60 mL/min (ref >60)
Glucose, Bld: 66 mg/dL (ref 65–99)
POTASSIUM: 3.5 mmol/L (ref 3.5–5.1)
Sodium: 136 mmol/L (ref 135–145)

## 2016-07-05 LAB — CBC
HEMATOCRIT: 39.7 % (ref 39.0–52.0)
HEMOGLOBIN: 13.5 g/dL (ref 13.0–17.0)
MCH: 30.5 pg (ref 26.0–34.0)
MCHC: 34 g/dL (ref 30.0–36.0)
MCV: 89.8 fL (ref 78.0–100.0)
Platelets: 171 10*3/uL (ref 150–400)
RBC: 4.42 MIL/uL (ref 4.22–5.81)
RDW: 15.7 % — ABNORMAL HIGH (ref 11.5–15.5)
WBC: 4.6 10*3/uL (ref 4.0–10.5)

## 2016-07-09 ENCOUNTER — Ambulatory Visit (HOSPITAL_COMMUNITY): Payer: 59 | Admitting: Anesthesiology

## 2016-07-09 ENCOUNTER — Other Ambulatory Visit: Payer: Self-pay

## 2016-07-09 ENCOUNTER — Ambulatory Visit (HOSPITAL_COMMUNITY)
Admission: RE | Admit: 2016-07-09 | Discharge: 2016-07-09 | Disposition: A | Discharge status: Home / Self Care | Payer: 59 | Source: Ambulatory Visit | Attending: Gastroenterology | Admitting: Gastroenterology

## 2016-07-09 ENCOUNTER — Telehealth: Payer: Self-pay | Admitting: Gastroenterology

## 2016-07-09 ENCOUNTER — Encounter (HOSPITAL_COMMUNITY): Payer: Self-pay | Admitting: *Deleted

## 2016-07-09 ENCOUNTER — Encounter (HOSPITAL_COMMUNITY)
Admission: RE | Disposition: A | Discharge status: Home / Self Care | Payer: Self-pay | Source: Ambulatory Visit | Attending: Gastroenterology

## 2016-07-09 DIAGNOSIS — F17210 Nicotine dependence, cigarettes, uncomplicated: Secondary | ICD-10-CM | POA: Diagnosis not present

## 2016-07-09 DIAGNOSIS — K2950 Unspecified chronic gastritis without bleeding: Secondary | ICD-10-CM | POA: Insufficient documentation

## 2016-07-09 DIAGNOSIS — K219 Gastro-esophageal reflux disease without esophagitis: Secondary | ICD-10-CM | POA: Diagnosis not present

## 2016-07-09 DIAGNOSIS — I739 Peripheral vascular disease, unspecified: Secondary | ICD-10-CM | POA: Insufficient documentation

## 2016-07-09 DIAGNOSIS — K2970 Gastritis, unspecified, without bleeding: Secondary | ICD-10-CM

## 2016-07-09 DIAGNOSIS — Z8711 Personal history of peptic ulcer disease: Secondary | ICD-10-CM | POA: Insufficient documentation

## 2016-07-09 DIAGNOSIS — K6289 Other specified diseases of anus and rectum: Secondary | ICD-10-CM

## 2016-07-09 DIAGNOSIS — K449 Diaphragmatic hernia without obstruction or gangrene: Secondary | ICD-10-CM | POA: Insufficient documentation

## 2016-07-09 DIAGNOSIS — D649 Anemia, unspecified: Secondary | ICD-10-CM | POA: Diagnosis present

## 2016-07-09 DIAGNOSIS — D509 Iron deficiency anemia, unspecified: Secondary | ICD-10-CM | POA: Diagnosis not present

## 2016-07-09 DIAGNOSIS — Q438 Other specified congenital malformations of intestine: Secondary | ICD-10-CM | POA: Diagnosis not present

## 2016-07-09 DIAGNOSIS — Z9884 Bariatric surgery status: Secondary | ICD-10-CM | POA: Diagnosis not present

## 2016-07-09 DIAGNOSIS — F319 Bipolar disorder, unspecified: Secondary | ICD-10-CM | POA: Insufficient documentation

## 2016-07-09 DIAGNOSIS — I10 Essential (primary) hypertension: Secondary | ICD-10-CM | POA: Diagnosis not present

## 2016-07-09 HISTORY — PX: ESOPHAGOGASTRODUODENOSCOPY (EGD) WITH PROPOFOL: SHX5813

## 2016-07-09 HISTORY — PX: BIOPSY: SHX5522

## 2016-07-09 HISTORY — PX: COLONOSCOPY WITH PROPOFOL: SHX5780

## 2016-07-09 SURGERY — ESOPHAGOGASTRODUODENOSCOPY (EGD) WITH PROPOFOL
Anesthesia: Monitor Anesthesia Care

## 2016-07-09 MED ORDER — PROPOFOL 10 MG/ML IV BOLUS
INTRAVENOUS | Status: AC
  Filled 2016-07-09: qty 20

## 2016-07-09 MED ORDER — STERILE WATER FOR IRRIGATION IR SOLN
Status: DC | PRN
  Administered 2016-07-09: 200 mL

## 2016-07-09 MED ORDER — ATROPINE SULFATE 0.4 MG/ML IJ SOLN
INTRAMUSCULAR | Status: DC | PRN
  Administered 2016-07-09: 0.2 mg via INTRAVENOUS

## 2016-07-09 MED ORDER — GLYCOPYRROLATE 0.2 MG/ML IJ SOLN
INTRAMUSCULAR | Status: AC
  Filled 2016-07-09: qty 1

## 2016-07-09 MED ORDER — ONDANSETRON HCL 4 MG/2ML IJ SOLN
4.0000 mg | Freq: Once | INTRAMUSCULAR | Status: AC
  Administered 2016-07-09: 4 mg via INTRAVENOUS

## 2016-07-09 MED ORDER — MIDAZOLAM HCL 2 MG/2ML IJ SOLN
INTRAMUSCULAR | Status: AC
  Filled 2016-07-09: qty 2

## 2016-07-09 MED ORDER — GLYCOPYRROLATE 0.2 MG/ML IJ SOLN
0.2000 mg | Freq: Once | INTRAMUSCULAR | Status: AC
  Administered 2016-07-09: 0.2 mg via INTRAVENOUS

## 2016-07-09 MED ORDER — MIDAZOLAM HCL 5 MG/5ML IJ SOLN
INTRAMUSCULAR | Status: DC | PRN
  Administered 2016-07-09: 2 mg via INTRAVENOUS

## 2016-07-09 MED ORDER — LIDOCAINE VISCOUS 2 % MT SOLN
OROMUCOSAL | Status: AC
  Filled 2016-07-09: qty 15

## 2016-07-09 MED ORDER — HYDROMORPHONE HCL 1 MG/ML IJ SOLN: 0.2500 mg | INTRAMUSCULAR | Status: DC | PRN

## 2016-07-09 MED ORDER — PROPOFOL 500 MG/50ML IV EMUL
INTRAVENOUS | Status: DC | PRN
  Administered 2016-07-09: 11:00:00 via INTRAVENOUS
  Administered 2016-07-09: 125 ug/kg/min via INTRAVENOUS

## 2016-07-09 MED ORDER — GLYCOPYRROLATE 0.2 MG/ML IJ SOLN: 0.1000 mg | Freq: Once | INTRAMUSCULAR | Status: DC

## 2016-07-09 MED ORDER — MIDAZOLAM HCL 2 MG/2ML IJ SOLN
1.0000 mg | INTRAMUSCULAR | Status: DC | PRN
  Administered 2016-07-09 (×2): 2 mg via INTRAVENOUS
  Filled 2016-07-09: qty 2

## 2016-07-09 MED ORDER — FENTANYL CITRATE (PF) 100 MCG/2ML IJ SOLN
INTRAMUSCULAR | Status: AC
  Filled 2016-07-09: qty 2

## 2016-07-09 MED ORDER — LIDOCAINE VISCOUS 2 % MT SOLN
5.0000 mL | OROMUCOSAL | Status: AC
  Administered 2016-07-09: 5 mL via OROMUCOSAL

## 2016-07-09 MED ORDER — ONDANSETRON HCL 4 MG/2ML IJ SOLN
INTRAMUSCULAR | Status: AC
Start: 2016-07-09 — End: 2016-07-09
  Filled 2016-07-09: qty 2

## 2016-07-09 MED ORDER — CHLORHEXIDINE GLUCONATE CLOTH 2 % EX PADS: 6.0000 | MEDICATED_PAD | Freq: Once | CUTANEOUS | Status: DC

## 2016-07-09 MED ORDER — ATROPINE SULFATE 0.4 MG/ML IJ SOLN
INTRAMUSCULAR | Status: AC
Start: 2016-07-09 — End: 2016-07-09
  Filled 2016-07-09: qty 1

## 2016-07-09 MED ORDER — PROPOFOL 10 MG/ML IV BOLUS
INTRAVENOUS | Status: AC
  Filled 2016-07-09: qty 40

## 2016-07-09 MED ORDER — LACTATED RINGERS IV SOLN
INTRAVENOUS | Status: DC
Start: 2016-07-09 — End: 2016-07-09
  Administered 2016-07-09: 09:00:00 via INTRAVENOUS

## 2016-07-09 MED ORDER — OMEPRAZOLE 40 MG PO CPDR: DELAYED_RELEASE_CAPSULE | ORAL | 3 refills | Status: DC

## 2016-07-09 MED ORDER — FENTANYL CITRATE (PF) 100 MCG/2ML IJ SOLN
25.0000 ug | INTRAMUSCULAR | Status: AC | PRN
  Administered 2016-07-09 (×2): 25 ug via INTRAVENOUS

## 2016-07-09 NOTE — Op Note (Signed)
[facility] Patient Name: Tony DibbleJeffery Dipaola Procedure Date: 07/09/2016 10:06 AM MRN: [id number] Date of Birth: 1981/07/13 Attending MD: Jonette EvaSandi Anastasha Ortez , MD CSN: [id number] Age: [age] Admit Type: Outpatient Procedure:                Colonoscopy, DIAGNOSTIC Indications:              Iron deficiency anemia-PSHx: GASTRIC BYPASS Providers:                Jonette EvaSandi Edie Darley, MD, Edrick Kinsammy Vaught, RN, Jannett CelestineAnitra Bell,                            RN, Lollie Marrowaylor B. Val EagleLemons, Technician Referring MD:              Medicines:                Propofol per Anesthesia Complications:            No immediate complications. Estimated Blood Loss:     Estimated blood loss: none. Procedure:                Pre-Anesthesia Assessment:                           - Prior to the procedure, a History and Physical                            was performed, and patient medications and                            allergies were reviewed. The patient's tolerance of                            previous anesthesia was also reviewed. The risks                            and benefits of the procedure and the sedation                            options and risks were discussed with the patient.                            All questions were answered, and informed consent                            was obtained. Prior Anticoagulants: The patient has                            taken no previous anticoagulant or antiplatelet                            agents. ASA Grade Assessment: II - A patient with                            mild systemic disease. After reviewing the risks  and benefits, the patient was deemed in                            satisfactory condition to undergo the procedure.                            After obtaining informed consent, the colonoscope                            was passed under direct vision. Throughout the                            procedure, the patient's blood pressure, pulse, and                 oxygen saturations were monitored continuously. The                            terminal ileum, ileocecal valve, appendiceal                            orifice, and rectum were photographed. The                            colonoscopy was somewhat difficult due to a                            tortuous colon. Successful completion of the                            procedure was aided by applying abdominal pressure                            and COLOWRAP. The patient tolerated the procedure                            well. The quality of the bowel preparation was                            excellent. The EC-[id number] ([id number] scope was                            introduced through the anus and advanced to the 10                            cm into the ileum. Scope In: 10:22:15 AM Scope Out: 10:41:46 AM Scope Withdrawal Time: 0 hours 9 minutes 48 seconds  Total Procedure Duration: 0 hours 19 minutes 31 seconds  Findings:      The digital rectal exam findings include decreased sphincter tone.      The terminal ileum appeared normal.      The recto-sigmoid colon, sigmoid colon and descending colon were       moderately redundant. AND COLOWRAP      The retroflexed view of the distal rectum and anal verge was normal and       showed  no anal or rectal abnormalities. Impression:               - Decreased sphincter tone found on digital rectal                            exam.                           - The examined portion of the ileum was normal.                           - Redundant colon.                           - NO SOURCE FOR ANEMIA IDENTIFIED Moderate Sedation:      Per Anesthesia Care Recommendation:           - High fiber diet.                           - Continue present medications. LINZESS 72-145 MCG                            DAILY                           - Return to my office in 4 months.                           - Patient has a contact number available for                             emergencies. The signs and symptoms of potential                            delayed complications were discussed with the                            patient. Return to normal activities tomorrow.                            Written discharge instructions were provided to the                            patient.                           - Repeat colonoscopy AGE 35 FOR SURVEILLANCE. Procedure Code(s):        --- Professional ---                           [phone], Colonoscopy, flexible; diagnostic, including                            collection of specimen(s) by brushing or washing,  when performed (separate procedure) Diagnosis Code(s):        --- Professional ---                           K62.89, Other specified diseases of anus and rectum                           D50.9, Iron deficiency anemia, unspecified                           Q43.8, Other specified congenital malformations of                            intestine CPT copyright 2016 American Medical Association. All rights reserved. The codes documented in this report are preliminary and upon coder review may  be revised to meet current compliance requirements. Jonette Eva, MD Jonette Eva, MD 07/09/2016 10:59:21 AM This report has been signed electronically. Number of Addenda: 0

## 2016-07-09 NOTE — Telephone Encounter (Signed)
REFERRAL HAS BEEN MADE  

## 2016-07-09 NOTE — Anesthesia Preprocedure Evaluation (Signed)
Anesthesia Evaluation  Patient identified by MRN, date of birth, ID band Patient awake    Reviewed: Allergy & Precautions, NPO status , Patient's Chart, lab work & pertinent test results  Airway Mallampati: II  TM Distance: >3 FB     Dental  (+) Edentulous Upper, Edentulous Lower   Pulmonary Current Smoker,    breath sounds clear to auscultation       Cardiovascular + Peripheral Vascular Disease   Rhythm:Regular Rate:Normal     Neuro/Psych PSYCHIATRIC DISORDERS Depression Bipolar Disorder    GI/Hepatic PUD, GERD  ,Gastric Bypass   Endo/Other    Renal/GU      Musculoskeletal   Abdominal   Peds  Hematology   Anesthesia Other Findings   Reproductive/Obstetrics                             Anesthesia Physical Anesthesia Plan  ASA: II  Anesthesia Plan: MAC   Post-op Pain Management:    Induction: Intravenous  Airway Management Planned: Simple Face Mask  Additional Equipment:   Intra-op Plan:   Post-operative Plan:   Informed Consent: I have reviewed the patients History and Physical, chart, labs and discussed the procedure including the risks, benefits and alternatives for the proposed anesthesia with the patient or authorized representative who has indicated his/her understanding and acceptance.     Plan Discussed with:   Anesthesia Plan Comments:         Anesthesia Quick Evaluation

## 2016-07-09 NOTE — Transfer of Care (Signed)
Immediate Anesthesia Transfer of Care Note  Patient: Tony PostinJeffery A Davila  Procedure(s) Performed: Procedure(s) with comments: ESOPHAGOGASTRODUODENOSCOPY (EGD) WITH PROPOFOL (N/A) - 945 COLONOSCOPY WITH PROPOFOL (N/A) BIOPSY - gastric bx's  Patient Location: PACU  Anesthesia Type:MAC  Level of Consciousness: awake and patient cooperative  Airway & Oxygen Therapy: Patient Spontanous Breathing and Patient connected to face mask oxygen  Post-op Assessment: Report given to RN, Post -op Vital signs reviewed and stable and Patient moving all extremities  Post vital signs: Reviewed and stable  Last Vitals:  Vitals:   07/09/16 0940 07/09/16 0945  BP: 120/78 124/77  Pulse:    Resp: 18 17  Temp:      Last Pain:  Vitals:   07/09/16 0839  TempSrc: Oral      Patients Stated Pain Goal: 7 (07/09/16 0839)  Complications: No apparent anesthesia complications

## 2016-07-09 NOTE — Discharge Instructions (Signed)
NO SOURCE FOR YOUR LOW BLOOD COUNT WAS IDENTIFIED. YOU DID NOT HAVE ANY POLYPS OR ULCERS. 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 I biopsied your stomach.    STOP IRON PILLS. EAT FOOD THAT CONTAINS IRON. SEE INFO BELOW.  SEE HEMATOLOGY FOR IV IRON INFUSIONS.  USE FIBER POWDER OR 1 PACKET ONCE DAILY FOR 3 DAYS THEN TWICE DAILY. AVOID HIGHER DOSES IF IT CAUSES BLOATING & GAS.  CONTINUE LINZESS. Use 72 mcg or 145 mcg tablets. TAKE WITH OUR FIRST MEAL.  YOUR BIOPSY RESULTS WILL BE AVAILABLE IN MY CHART AFTER AUG 18 AND     OR MY OFFICE WILL CONTACT YOU IN 10-14 DAYS WITH YOUR RESULTS.   FOLLOW UP IN 4 MOS.  NEXT COLONOSCOPY AT AGE 43.   ENDOSCOPY Care After Read the instructions outlined below and refer to this sheet in the next week. These discharge instructions provide you with general information on caring for yourself after you leave the hospital. While your treatment has been planned according to the most current medical practices available, unavoidable complications occasionally occur. If you have any problems or questions after discharge, call DR. Manuel Lawhead, [phone].  ACTIVITY  You may resume your regular activity, but move at a slower pace for the next 24 hours.   Take frequent rest periods for the next 24 hours.   Walking will help get rid of the air and reduce the bloated feeling in your belly (abdomen).   No driving for 24 hours (because of the medicine (anesthesia) used during the test).   You may shower.   Do not sign any important legal documents or operate any machinery for 24 hours (because of the anesthesia used during the test).    NUTRITION  Drink plenty of fluids.   You may resume your normal diet as instructed by your doctor.   Begin with a light meal and progress to your normal diet. Heavy or fried foods are harder to digest and may make you feel sick to your stomach (nauseated).    Avoid alcoholic beverages for 24 hours or as instructed.    MEDICATIONS  You may resume your normal medications.   WHAT YOU CAN EXPECT TODAY  Some feelings of bloating in the abdomen.   Passage of more gas than usual.   Spotting of blood in your stool or on the toilet paper  .  IF YOU HAD POLYPS REMOVED DURING THE ENDOSCOPY:  Eat a soft diet IF YOU HAVE NAUSEA, BLOATING, ABDOMINAL PAIN, OR VOMITING.    FINDING OUT THE RESULTS OF YOUR TEST Not all test results are available during your visit. DR. Darrick PennaFIELDS WILL CALL YOU WITHIN 14 DAYS OF YOUR PROCEDUE WITH YOUR RESULTS. Do not assume everything is normal if you have not heard from DR. Melaina Howerton, CALL HER OFFICE AT [phone].  SEEK IMMEDIATE MEDICAL ATTENTION AND CALL THE OFFICE: [phone] IF:  You have more than a spotting of blood in your stool.   Your belly is swollen (abdominal distention).   You are nauseated or vomiting.   You have a temperature over 101F.   You have abdominal pain or discomfort that is severe or gets worse throughout the day.   Gastritis  Gastritis is an inflammation (the body's way of reacting to injury and/or infection) of the stomach. It is often caused by viral or bacterial (germ) infections. It can also be caused BY ASPIRIN, BC/GOODY POWDER'S, (IBUPROFEN) MOTRIN, OR ALEVE (  NAPROXEN), chemicals (including alcohol), SPICY FOODS, and medications. This illness may be associated with generalized malaise (feeling tired, not well), UPPER ABDOMINAL STOMACH cramps, and fever. One common bacterial cause of gastritis is an organism known as H. Pylori. This can be treated with antibiotics.     High Iron Diet  Purpose Iron is a mineral essential for life. Found in red blood cells, irons primary role is to carry oxygen from the lungs to the rest of the body. Without oxygen, the bodys cells cannot function normally. If the bodys iron stores become too low, an iron-deficiency anemia can occur. This is  characterized by weakness, lethargy, muscle fatigue, and shortness of breath. In severe cases, a persons skin may become pale due to a lack of red blood cells in the body. In adults, iron deficiency is most commonly caused by chronic blood loss, such as with heavy menstruation or intestinal bleeding from peptic ulcers, cancer, or hemorrhoids. In children, iron deficiency is usually the result of an inadequate iron intake.  Nutrition Facts The recommended dietary allowance (RDA) for iron in healthy adults is 10 milligrams per day for men and 15 milligrams per day for premenopausal women. Premenopausal womens needs are higher than mens needs because women lose iron during menstruation. It is generally easier for men to get enough iron than it is for women. Because they are usually bigger, men have higher calorie needs and will most likely eat enough food to meet their iron requirements. Women, on the other hand, tend to eat less. This makes it more difficult for them to meet their iron needs. It is, therefore, particularly important for premenopausal women to eat foods high in iron. Pregnant women will need as much as 30 milligrams of iron per day. The main reason is because the unborn baby needs iron for development. As a result, it will draw from the mothers iron stores. This can quickly deplete a woman of iron if she is not eating enough iron rich foods. The following table lists foods high in iron. In general, meat, fish, and poultry are excellent sources. Other sources of iron include beans, dried fruits, whole grains, fortified cereals, and enriched breads.   Special Considerations  1. Heme and nonhemd iron are two forms of iron in foods. Heme iron is found in meats, poultry, and fish. NonHeme iron is found in both plant and animal foods. Heme iron is more easily absorbed by the body than nonheme iron. However, heme iron can also promote the absorption of non-heme iron. Therefore, eating beef and  beans, for example, is good for providing adequate absorption of both types of iron. 2. Vitamin C also promotes iron absorption. This is true for both heme and nonheme iron. It is, therefore, beneficial to consume citrus fruits or juices, which are high in vitamin C, with foods that contain iron. For example, a meal might include a lean sirloin steak (heme iron source), baked potato (nonheme iron source , broccoli (nonheme iroj source), and an orange (vitamin C source) for a good iron intake. 3. Phytic and tannic aids are two food components that, when consumed in large amounts, prevent the abrorption of iron. Phytic acid is found in rye bread and other foods made from whole grains. Phytic acid is also found in nonherbal teas. Tannic acid is found in commercial black and pekoe teas, coffee, cola drinks, chocolate, and red wines. 4. Iron SupplementsThere are many different kinds of iron supplements. However, iron supplemants should only be taken when  there is a true deficiency of iron and only under medical supervision. General multivitamins often have iron and other minerals added to them in moderate amounts. If otherwise healthy, this amount of iron is probably not harmful. If iron is to be avoided, multivitamins containing iron should not be used.Please note that it is important to keep iron and multivita-in supplements safely away from a childs reach. If ingested, severe poisoning can occur.   Foods That Contain Iron  Food Serving Size (mg)  Bran flakes cereal 1 cup 24.0  Product 19 cereal 1 cup 24.0  Clams, steamed 3 oz 23.8  Total cereal 1 cup 18.0  Life cereal 1 cup 12.2  Raisin bran cereal 1 cup 9.3  Beef liver, braised 3 oz 5.8  Kix cereal 1 cup 5.4  Cheerios cereal 1 cup 3.6  Prune juice 1 cup 3.0  Potato, baked with skin 1 med 2.8  Sirloin steak, cooked 3 oz 2.8  Shrimp, cooked 3 oz 2.6  Navy beans, cooked 1/2 cup 2.3  Figs, dried 5 2.1  Lean ground beef, broiled 3 oz 2.1  Swiss  chard, cooked 1/2 cup 2.0  Rice krispies cereal 1 cup 1.8  Kidney beans 1/2 cup 1.6  Oatmeal, cooked 1/2 cup 1.6  Spinach, raw 1 cup 1.5  Tuna, canned in water 3 oz 1.3  Green peas, conked 1/2 cup 1.2  Halibut, cooked 3 oz 0.9  Whole-wheat bread 1 slice 0.9  Apricot halves, dried 5 0.8  Raisins 1/4 cup 0.8  Broccoli, cooked 1/2 cup 0.6  Egg, boiled 1 large 0.6

## 2016-07-09 NOTE — Telephone Encounter (Signed)
  SEE HEMATOLOGY FOR IV IRON INFUSIONS, Dx: ANEMIA.

## 2016-07-09 NOTE — H&P (Signed)
Primary Care Physician:  [name],[name], PA-C Primary Gastroenterologist:  Dr. Darrick PennaFields  Pre-Procedure History & Physical: HPI:  Tony Davila is a 35 y.o. male here for Anemia-PSHx; gastric bypass/PMHx: PUD.  Past Medical History:  Diagnosis Date  . Acute gastrojejunal anastomotic ulcer 03/2013  . Allergy   . Anemia   . Asthma    as a child  . Bipolar disorder (HCC)   . Depression   . 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 GERD (gastroesophageal reflux disease)   . History of kidney stones   . Hypertension    not tx for now  . Jejunal ulcer 2009   anastomotic ulcer after gastric bypass  . Jejunal ulcer 2009   anastomotic  . Kidney stone   . 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 Varicose vein     Past Surgical History:  Procedure Laterality Date  . ESOPHAGOGASTRODUODENOSCOPY  04/12/2013   Dr. Leone PayorGessner: Single ulcer measuring 8 x 8 mm found at the anastomosis on the jejunal side, gastric enteroenterostomy was found in the gastric body. Biopsy benign.  . ESOPHAGOGASTRODUODENOSCOPY  05/31/2013   Dr. Leone PayorGessner: Single small scar on the jejunal side of the gastroenterostomy, ulcer healed.  . ESOPHAGOGASTRODUODENOSCOPY  08/10/2013   Medium-sized ulcer found in the jejunum at the gastrojejunal anastomosis, status post gastric bypass and oversew perforated ulcer.  . ESOPHAGOGASTRODUODENOSCOPY  03/21/2008   1-2 cm hiatal hernia  . full mouth extraction    . GASTRIC BYPASS  2009   Dr. Johna SheriffHoxworth - Roux en Y  . GASTRIC ROUX-EN-Y N/A 05/26/2015   Procedure: DIAGNOSTIC LAPAROSCOPY WITH OVERSEW OF GASTRIC DUODENAL ULCER WITH OMENTAL PATCH;  Surgeon: Ovidio Kinavid Newman, MD;  Location: [facility];  Service: General;  Laterality: N/A;  . LAPAROSCOPY  01/17/2012   over-sew of perforated gastrojejunal ulcer x2  . TONSILLECTOMY  Age 35  . UPPER GASTROINTESTINAL ENDOSCOPY  02/19/2011   normal post-op exam s/p gastric bypass  . WISDOM TOOTH EXTRACTION  Age 35    Prior to Admission  medications   Medication Sig Start Date End Date Taking? Authorizing Provider  divalproex (DEPAKOTE) 250 MG DR tablet Take 250 mg by mouth 2 (two) times daily.  06/11/16  Yes Historical Provider, MD  ferrous sulfate 325 (65 FE) MG tablet Take 325 mg by mouth 2 (two) times daily. 06/13/16  Yes Historical Provider, MD  linaclotide Karlene Einstein(LINZESS) 72 MCG capsule Take 1 capsule (72 mcg total) by mouth daily before breakfast. 06/19/16  Yes Tiffany KocherLeslie S Lewis, PA-C  omeprazole (PRILOSEC) 40 MG capsule Take 1 capsule (40 mg total) by mouth 2 (two) times daily before a meal. 06/19/16  Yes Tiffany KocherLeslie S Lewis, PA-C  ondansetron (ZOFRAN-ODT) 4 MG disintegrating tablet Take 1 tablet (4 mg total) by mouth every 8 (eight) hours as needed for nausea or vomiting. 06/19/16  Yes Tiffany KocherLeslie S Lewis, PA-C  polyethylene glycol-electrolytes (TRILYTE) 420 g solution Take 4,000 mLs by mouth as directed. 06/19/16  Yes West BaliSandi L Fields, MD  sucralfate (CARAFATE) 1 GM/10ML suspension Take 10 mLs (1 g total) by mouth 4 (four) times daily -  with meals and at bedtime. 05/30/15  Yes Ashok NorrisEmina Riebock, NP    Allergies as of 07/01/2016  . (No Known Allergies)    Family History  Problem Relation Age of Onset  . Irritable bowel syndrome Father   . Alcohol abuse Father   . Drug abuse Father   . Irritable bowel syndrome Brother   .  Cystic fibrosis Mother     patient denies, 06/21/2016  . Drug abuse Mother   . Alcohol abuse Mother   . Heart disease Maternal Grandmother   . Hyperlipidemia Maternal Grandmother   . Hypertension Maternal Grandmother   . Mental illness Maternal Grandmother   . Diabetes Maternal Grandmother   . Bipolar disorder Brother   . Alcohol abuse Paternal Uncle   . Drug abuse Paternal Uncle   . Colon cancer Neg Hx   . Stomach cancer Neg Hx   . Esophageal cancer Neg Hx   . Rectal cancer Neg Hx     Social History   Social History  . Marital status: Married    Spouse name: N/A  . Number of children: 0  . Years of education:  N/A   Occupational History  . [employer]   Social History Main Topics  . Smoking status: Current Every Day Smoker    Packs/day: 0.50    Years: 12.00    Types: Cigarettes  . Smokeless tobacco: Current User     Comment: less than 1/2 ppd.  . Alcohol use No  . Drug use: No     Comment: vicodan, percocet 70 DAYS OFF DRUGS  . Sexual activity: Yes   Other Topics Concern  . Not on file   Social History Narrative   06/04/2013 AHW  [place]ila was [place]. He has a half-brother and half-sister, both younger. His parents divorced when he was [age], and he was raised by his maternal grandmother and grandfather. He reports that his mother was not present during his childhood as she was dating men. Tony Davila did not meet his biological father again until he was 35 years of age. He graduated from high school, and has completed some college. He currently works for [employer]. He has been married for 9 years, and has no children. He currently lives with his wife and 33 year old grandmother, for whom he and his wife care do to her dementia, CHF, and COPD. He denies any legal problems. He reports that he is agnostic. His hobbies include fishing. His social support system consists of his wife. 06/04/2013 AHW      Review of Systems: See HPI, otherwise negative ROS   Physical Exam: BP 124/77   Pulse (!) 45   Temp (!) 96.7 F (35.9 C) (Oral)   Resp 17   Ht 5\' 7"  (1.702 m)   Wt 145 lb (65.8 kg)   SpO2 100%   BMI 22.71 kg/m  General:   Alert,  pleasant and cooperative in NAD Head:  Normocephalic and atraumatic. Neck:  Supple; Lungs:  Clear throughout to auscultation.    Heart:  Regular rate and rhythm. Abdomen:  Soft, nontender and nondistended. Normal bowel sounds, without guarding, and without rebound.   Neurologic:  Alert and  oriented x4;  grossly normal neurologically.  Impression/Plan:      Anemia-pshX; gastric bypass  PLAN: EGD/TCS TODAY

## 2016-07-09 NOTE — Op Note (Signed)
[facility] Patient Name: Tony Davila Procedure Date: 07/09/2016 10:43 AM MRN: [id number] Date of Birth: 1981-08-01 Attending MD: Jonette Eva , MD CSN: [id number] Age: 35 Admit Type: Outpatient Procedure:                Upper GI endoscopy WITH COLD FORCEPS BIOPSY Indications:              Iron deficiency anemia Providers:                Jonette Eva, MD, Edrick Kins, RN, Jannett Celestine,                            RN, Calton Dach, Technician Referring MD:              Medicines:                Propofol per Anesthesia Complications:            No immediate complications. Estimated Blood Loss:     Estimated blood loss was minimal. Procedure:                Pre-Anesthesia Assessment:                           - Prior to the procedure, a History and Physical                            was performed, and patient medications and                            allergies were reviewed. The patient's tolerance of                            previous anesthesia was also reviewed. The risks                            and benefits of the procedure and the sedation                            options and risks were discussed with the patient.                            All questions were answered, and informed consent                            was obtained. Prior Anticoagulants: The patient has                            taken no previous anticoagulant or antiplatelet                            agents. ASA Grade Assessment: II - A patient with                            mild systemic disease. After reviewing the risks  and benefits, the patient was deemed in                            satisfactory condition to undergo the procedure.                            After obtaining informed consent, the endoscope was                            passed under direct vision. Throughout the                            procedure, the patient's blood pressure, pulse, and                 oxygen saturations were monitored continuously. The                            [id number]-[id number] ([id number] scope was introduced through the                            mouth, and advanced to the second part of duodenum.                            The upper GI endoscopy was accomplished without                            difficulty. The patient tolerated the procedure                            well. Scope In: 10:47:12 AM Scope Out: 10:52:05 AM Total Procedure Duration: 0 hours 4 minutes 53 seconds  Findings:      The examined esophagus was normal.      Scattered mild inflammation characterized by congestion (edema) and       erythema was found in the gastric REMNANT(~7 CM IN LENGTH). Biopsies       were taken with a cold forceps for Helicobacter pylori testing.      The examined jejunum(ROUX LIMB) was normal.      A small hiatal hernia was present. Impression:               - Normal esophagus.                           - Gastritis. Biopsied.                           - NO SOURCE FOR ANEMIA IDENTIFIED AND IS MOST                            LIKELY DUE TO POSTGASTRECTOMY STATE Moderate Sedation:      Per Anesthesia Care Recommendation:           - Resume previous diet.                           - Await pathology results.                           -  Return to my office in 4 months.                           -SEE HEMATOLOGY FOR IVFE                           - Patient has a contact number available for                            emergencies. The signs and symptoms of potential                            delayed complications were discussed with the                            patient. Return to normal activities tomorrow.                            Written discharge instructions were provided to the                            patient.                           - Continue present medications. Procedure Code(s):        --- Professional ---                           [phone],  Esophagogastroduodenoscopy, flexible,                            transoral; with biopsy, single or multiple Diagnosis Code(s):        --- Professional ---                           K29.70, Gastritis, unspecified, without bleeding                           D50.9, Iron deficiency anemia, unspecified CPT copyright 2016 American Medical Association. All rights reserved. The codes documented in this report are preliminary and upon coder review may  be revised to meet current compliance requirements. Jonette EvaSandi Fields, MD Jonette EvaSandi Fields, MD 07/09/2016 11:05:07 AM This report has been signed electronically. Number of Addenda: 0

## 2016-07-09 NOTE — Anesthesia Postprocedure Evaluation (Signed)
Anesthesia Post Note  Patient: Mardee PostinJeffery A Petros  Procedure(s) Performed: Procedure(s) (LRB): ESOPHAGOGASTRODUODENOSCOPY (EGD) WITH PROPOFOL (N/A) COLONOSCOPY WITH PROPOFOL (N/A) BIOPSY  Patient location during evaluation: PACU Anesthesia Type: MAC Level of consciousness: awake, oriented and patient cooperative Pain management: pain level controlled Vital Signs Assessment: post-procedure vital signs reviewed and stable Respiratory status: spontaneous breathing, nonlabored ventilation and respiratory function stable Cardiovascular status: blood pressure returned to baseline and stable Postop Assessment: no signs of nausea or vomiting Anesthetic complications: no    Last Vitals:  Vitals:   07/09/16 0945 07/09/16 1101  BP: 124/77 96/68  Pulse:  64  Resp: 17 14  Temp:  36.7 C    Last Pain:  Vitals:   07/09/16 0839  TempSrc: Oral                 Alexandrea Westergard J

## 2016-07-12 ENCOUNTER — Telehealth: Payer: Self-pay | Admitting: Gastroenterology

## 2016-07-12 ENCOUNTER — Encounter (HOSPITAL_COMMUNITY): Payer: Self-pay | Admitting: Gastroenterology

## 2016-07-12 NOTE — Telephone Encounter (Signed)
Please call pt. His stomach Bx shows mild gastritis. HIS LOW IRON IS DUE TO HAVING GASTRIC BYPASS AND ONLY PART OF HIS STOMACH    STOP IRON PILLS. EAT FOOD THAT CONTAINS IRON. SEE INFO BELOW.  SEE HEMATOLOGY FOR IV IRON INFUSIONS IF NEEDED.  USE FIBER POWDER OR 1 PACKET ONCE DAILY FOR 3 DAYS THEN TWICE DAILY. AVOID HIGHER DOSES IF IT CAUSES BLOATING & GAS.  CONTINUE LINZESS. Use 72 mcg or 145 mcg tablets. TAKE WITH OUR FIRST MEAL.  FOLLOW UP IN 4 MOS E30 ANEMIA/CONSTIPATION.  NEXT COLONOSCOPY AT AGE 74.

## 2016-07-15 NOTE — Telephone Encounter (Signed)
OV made °

## 2016-07-15 NOTE — Telephone Encounter (Signed)
L/M to call.

## 2016-07-18 NOTE — Telephone Encounter (Signed)
Letter mailed for pt to call for results.  

## 2016-07-23 ENCOUNTER — Other Ambulatory Visit: Payer: Self-pay

## 2016-09-04 ENCOUNTER — Ambulatory Visit (HOSPITAL_COMMUNITY): Payer: Self-pay | Admitting: Oncology

## 2016-09-18 NOTE — Progress Notes (Deleted)
Psychiatric Initial Adult Assessment   Patient Identification: Tony PostinJeffery A Haslam MRN:  [id number] Date of Evaluation:  09/18/2016 Referral Source: *** Chief Complaint:   Visit Diagnosis: No diagnosis found.  History of Present Illness:   Tony Davila is a 35 year old male with bipolar disorder, anemia, weight loss, peptic ulcer disease, gastric bypass (Roux-en-Y) surgery in 2009 .   Associated Signs/Symptoms: Depression Symptoms:  {DEPRESSION SYMPTOMS:20000} (Hypo) Manic Symptoms:  {BHH MANIC SYMPTOMS:22872} Anxiety Symptoms:  {BHH ANXIETY SYMPTOMS:22873} Psychotic Symptoms:  {BHH PSYCHOTIC SYMPTOMS:22874} PTSD Symptoms: {BHH PTSD SYMPTOMS:22875}  Past Psychiatric History: ***  Previous Psychotropic Medications: {YES/NO:21197}  Substance Abuse History in the last 12 months:  {yes no:314532}  Consequences of Substance Abuse: {BHH CONSEQUENCES OF SUBSTANCE ABUSE:22880}  Past Medical History:  Past Medical History:  Diagnosis Date  . Acute gastrojejunal anastomotic ulcer 03/2013  . Allergy   . Anemia   . Asthma    as a child  . Bipolar disorder (HCC)   . Depression   . 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 GERD (gastroesophageal reflux disease)   . History of kidney stones   . Hypertension    not tx for now  . Jejunal ulcer 2009   anastomotic ulcer after gastric bypass  . Jejunal ulcer 2009   anastomotic  . Kidney stone   . 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 Varicose vein     Past Surgical History:  Procedure Laterality Date  . BIOPSY  07/09/2016   Procedure: BIOPSY;  Surgeon: West BaliSandi L Fields, MD;  Location: [facility];  Service: Endoscopy;;  gastric bx's  . COLONOSCOPY WITH PROPOFOL N/A 07/09/2016   Procedure: COLONOSCOPY WITH PROPOFOL;  Surgeon: West BaliSandi L Fields, MD;  Location: [facility];  Service: Endoscopy;  Laterality: N/A;  . ESOPHAGOGASTRODUODENOSCOPY  04/12/2013   Dr. Leone PayorGessner: Single ulcer measuring 8 x 8 mm found at the  anastomosis on the jejunal side, gastric enteroenterostomy was found in the gastric body. Biopsy benign.  . ESOPHAGOGASTRODUODENOSCOPY  05/31/2013   Dr. Leone PayorGessner: Single small scar on the jejunal side of the gastroenterostomy, ulcer healed.  . ESOPHAGOGASTRODUODENOSCOPY  08/10/2013   Medium-sized ulcer found in the jejunum at the gastrojejunal anastomosis, status post gastric bypass and oversew perforated ulcer.  . ESOPHAGOGASTRODUODENOSCOPY  03/21/2008   1-2 cm hiatal hernia  . ESOPHAGOGASTRODUODENOSCOPY (EGD) WITH PROPOFOL N/A 07/09/2016   Procedure: ESOPHAGOGASTRODUODENOSCOPY (EGD) WITH PROPOFOL;  Surgeon: West BaliSandi L Fields, MD;  Location: [facility];  Service: Endoscopy;  Laterality: N/A;  945  . full mouth extraction    . GASTRIC BYPASS  2009   Dr. Johna SheriffHoxworth - Roux en Y  . GASTRIC ROUX-EN-Y N/A 05/26/2015   Procedure: DIAGNOSTIC LAPAROSCOPY WITH OVERSEW OF GASTRIC DUODENAL ULCER WITH OMENTAL PATCH;  Surgeon: Ovidio Kinavid Newman, MD;  Location: [facility];  Service: General;  Laterality: N/A;  . LAPAROSCOPY  01/17/2012   over-sew of perforated gastrojejunal ulcer x2  . TONSILLECTOMY  Age 35  . UPPER GASTROINTESTINAL ENDOSCOPY  02/19/2011   normal post-op exam s/p gastric bypass  . WISDOM TOOTH EXTRACTION  Age 35    Family Psychiatric History: ***  Family History:  Family History  Problem Relation Age of Onset  . Irritable bowel syndrome Father   . Alcohol abuse Father   . Drug abuse Father   . Irritable bowel syndrome Brother   . Cystic fibrosis Mother     patient denies, 06/21/2016  . Drug  abuse Mother   . Alcohol abuse Mother   . Heart disease Maternal Grandmother   . Hyperlipidemia Maternal Grandmother   . Hypertension Maternal Grandmother   . Mental illness Maternal Grandmother   . Diabetes Maternal Grandmother   . Bipolar disorder Brother   . Alcohol abuse Paternal Uncle   . Drug abuse Paternal Uncle   . Colon cancer Neg Hx   . Stomach cancer Neg Hx   . Esophageal cancer Neg Hx    . Rectal cancer Neg Hx     Social History:   Social History   Social History  . Marital status: Married    Spouse name: N/A  . Number of children: 0  . Years of education: N/A   Occupational History  . [employer]   Social History Main Topics  . Smoking status: Current Every Day Smoker    Packs/day: 0.50    Years: 12.00    Types: Cigarettes  . Smokeless tobacco: Current User     Comment: less than 1/2 ppd.  . Alcohol use No  . Drug use: No     Comment: vicodan, percocet 70 DAYS OFF DRUGS  . Sexual activity: Yes   Other Topics Concern  . Not on file   Social History Narrative   06/04/2013 AHW  [name] was born in [place], [place]. He has a half-brother and half-sister, both younger. His parents divorced when he was [age], and he was raised by his maternal grandmother and grandfather. He reports that his mother was not present during his childhood as she was dating men. [name] did not meet his biological father again until he was 40 years of age. He graduated from high school, and has completed some college. He currently works for [employer]. He has been married for 9 years, and has no children. He currently lives with his wife and 62 year old grandmother, for whom he and his wife care do to her dementia, CHF, and COPD. He denies any legal problems. He reports that he is agnostic. His hobbies include fishing. His social support system consists of his wife. 06/04/2013 AHW      Additional Social History: ***  Allergies:  No Known Allergies  Metabolic Disorder Labs: No results found for: HGBA1C, MPG No results found for: PROLACTIN Lab Results  Component Value Date   CHOL 133 06/06/2011   TRIG 58 06/06/2011   HDL 50 06/06/2011   CHOLHDL 2.7 06/06/2011   VLDL 12 06/06/2011   LDLCALC 71 06/06/2011     Current Medications: Current Outpatient Prescriptions  Medication Sig Dispense Refill  . divalproex (DEPAKOTE) 250 MG DR tablet Take 250 mg by mouth 2 (two) times daily.     Marland Kitchen  linaclotide (LINZESS) 72 MCG capsule Take 1 capsule (72 mcg total) by mouth daily before breakfast. 90 capsule 3  . omeprazole (PRILOSEC) 40 MG capsule 1 po 30 mins prior to FIRST MEAL 90 capsule 3  . ondansetron (ZOFRAN-ODT) 4 MG disintegrating tablet Take 1 tablet (4 mg total) by mouth every 8 (eight) hours as needed for nausea or vomiting. 20 tablet 0   No current facility-administered medications for this visit.     Neurologic: Headache: {BHH YES OR NO:22294} Seizure: {BHH YES OR NO:22294} Paresthesias:{BHH YES OR UJ:81191}  Musculoskeletal: Strength & Muscle Tone: {desc; muscle tone:32375} Gait & Station: {PE GAIT ED YNWG:95621} Patient leans: {Patient Leans:21022755}  Psychiatric Specialty Exam: ROS  There were no vitals taken for this visit.There is no height or weight on  file to calculate BMI.  General Appearance: {Appearance:22683}  Eye Contact:  {BHH EYE CONTACT:22684}  Speech:  {Speech:22685}  Volume:  {Volume (PAA):22686}  Mood:  {BHH MOOD:22306}  Affect:  {Affect (PAA):22687}  Thought Process:  {Thought Process (PAA):22688}  Orientation:  {BHH ORIENTATION (PAA):22689}  Thought Content:  {Thought Content:22690}  Suicidal Thoughts:  {ST/HT (PAA):22692}  Homicidal Thoughts:  {ST/HT (PAA):22692}  Memory:  {BHH MEMORY:22881}  Judgement:  {Judgement (PAA):22694}  Insight:  {Insight (PAA):22695}  Psychomotor Activity:  {Psychomotor (PAA):22696}  Concentration:  {Concentration:21399}  Recall:  {BHH GOOD/FAIR/POOR:22877}  Fund of Knowledge:{BHH GOOD/FAIR/POOR:22877}  Language: {BHH GOOD/FAIR/POOR:22877}  Akathisia:  {BHH YES OR NO:22294}  Handed:  {Handed:22697}  AIMS (if indicated):  ***  Assets:  {Assets (PAA):22698}  ADL's:  {BHH ZDG'U:44034}  Cognition: {chl bhh cognition:304700322}  Sleep:  ***    Treatment Plan Summary: {CHL AMB BH MD TX VQQV:[id number]}   Neysa Hotter, MD 10/25/20172:01 PM

## 2016-09-19 ENCOUNTER — Ambulatory Visit (HOSPITAL_COMMUNITY): Payer: Self-pay | Admitting: Psychiatry

## 2016-09-19 ENCOUNTER — Ambulatory Visit (HOSPITAL_COMMUNITY): Payer: Self-pay | Admitting: Oncology

## 2016-10-07 ENCOUNTER — Telehealth: Payer: Self-pay | Admitting: *Deleted

## 2016-10-07 ENCOUNTER — Ambulatory Visit (HOSPITAL_COMMUNITY): Payer: Self-pay | Admitting: Oncology

## 2016-10-07 NOTE — Progress Notes (Deleted)
NO SHOW                                              [facility] Hematology/Oncology Consultation   Name: Tony Davila      MRN: [id number]    Location: Room/bed info not found  Date: 10/07/2016 Time:10:50 AM   REFERRING PHYSICIAN:  Jonette EvaSandi Fields, MD (GI)  REASON FOR CONSULT:  Anemia   DIAGNOSIS:  Anemia (normal HGB on most recent blood work) with history of Roux-en-Y gastric bypass by Dr. Johna SheriffHoxworth on 04/30/2008.  HISTORY OF PRESENT ILLNESS:  *** Tony Davila is a 35 y.o. male with a medical history significant for H/O obesity, cannabis abuse, depression, ED, GERD, HTN, tobacco abuse, and S/P Roux-en-Y by Dr. Johna SheriffHoxworth on 6/62009 complicated by perforation of gastrojejunostomy on 01/17/2012 and 05/25/2016 who is referred to the [facility] for "anemia" with a normal HGB on 07/05/2016.  He is S/P EGD and colonoscopy by Dr. Darrick PennaFields on 07/09/2016.  EGD demonstrated: normal esophagus, gastritis (biopsied), no source for anemia identified and most likely source of anemia is postgastrectomy state.  Colonoscopy notes a redundant colon and decreased sphincter tone on digital rectal exam, no source of anemia identified.  I personally reviewed and went over pathology results with the patient.  Pathology is negative for malignancy.  He has a history of a Roux-en-Y gastric bypass by Dr. Jaclynn GuarneriBen Hoxworth on April 30, 2008.  He has had a prior perforation of his gastrojejunostomy on January 17, 2012 repaired by Dr. Algis Downs. Newman.   Review of Systems - {ros master:310782}  PAST MEDICAL HISTORY:   Past Medical History:  Diagnosis Date  . Acute gastrojejunal anastomotic ulcer 03/2013  . Allergy   . Anemia   . Asthma    as a child  . Bipolar disorder (HCC)   . Depression   . 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 GERD (gastroesophageal reflux disease)   . History of kidney stones   . Hypertension    not tx for now    . Jejunal ulcer 2009   anastomotic ulcer after gastric bypass  . Jejunal ulcer 2009   anastomotic  . Kidney stone   . 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 Varicose vein     ALLERGIES: No Known Allergies    MEDICATIONS: {medication reviewed/display:3041432}    Current Outpatient Prescriptions on File Prior to Visit  Medication Sig Dispense Refill  . divalproex (DEPAKOTE) 250 MG DR tablet Take 250 mg by mouth 2 (two) times daily.     Marland Kitchen. linaclotide (LINZESS) 72 MCG capsule Take 1 capsule (72 mcg total) by mouth daily before breakfast. 90 capsule 3  . omeprazole (PRILOSEC) 40 MG capsule 1 po 30 mins prior to FIRST MEAL 90 capsule 3  . ondansetron (ZOFRAN-ODT) 4 MG disintegrating tablet Take 1 tablet (4 mg total) by mouth every 8 (eight) hours as needed for nausea or vomiting. 20 tablet 0   No current facility-administered medications on file prior to visit.      PAST SURGICAL HISTORY Past Surgical History:  Procedure Laterality Date  . BIOPSY  07/09/2016   Procedure: BIOPSY;  Surgeon: West BaliSandi L Fields, MD;  Location: [facility];  Service: Endoscopy;;  gastric bx's  . COLONOSCOPY WITH PROPOFOL  N/A 07/09/2016   Procedure: COLONOSCOPY WITH PROPOFOL;  Surgeon: West Bali, MD;  Location: [facility];  Service: Endoscopy;  Laterality: N/A;  . ESOPHAGOGASTRODUODENOSCOPY  04/12/2013   Dr. Leone Payor: Single ulcer measuring 8 x 8 mm found at the anastomosis on the jejunal side, gastric enteroenterostomy was found in the gastric body. Biopsy benign.  . ESOPHAGOGASTRODUODENOSCOPY  05/31/2013   Dr. Leone Payor: Single small scar on the jejunal side of the gastroenterostomy, ulcer healed.  . ESOPHAGOGASTRODUODENOSCOPY  08/10/2013   Medium-sized ulcer found in the jejunum at the gastrojejunal anastomosis, status post gastric bypass and oversew perforated ulcer.  . ESOPHAGOGASTRODUODENOSCOPY  03/21/2008   1-2 cm hiatal hernia  . ESOPHAGOGASTRODUODENOSCOPY (EGD) WITH PROPOFOL N/A 07/09/2016    Procedure: ESOPHAGOGASTRODUODENOSCOPY (EGD) WITH PROPOFOL;  Surgeon: West Bali, MD;  Location: [facility];  Service: Endoscopy;  Laterality: N/A;  945  . full mouth extraction    . GASTRIC BYPASS  2009   Dr. Johna Sheriff - Roux en Y  . GASTRIC ROUX-EN-Y N/A 05/26/2015   Procedure: DIAGNOSTIC LAPAROSCOPY WITH OVERSEW OF GASTRIC DUODENAL ULCER WITH OMENTAL PATCH;  Surgeon: Ovidio Kin, MD;  Location: [facility] ORS;  Service: General;  Laterality: N/A;  . LAPAROSCOPY  01/17/2012   over-sew of perforated gastrojejunal ulcer x2  . TONSILLECTOMY  Age 52  . UPPER GASTROINTESTINAL ENDOSCOPY  02/19/2011   normal post-op exam s/p gastric bypass  . WISDOM TOOTH EXTRACTION  Age 67    FAMILY HISTORY: Family History  Problem Relation Age of Onset  . Irritable bowel syndrome Father   . Alcohol abuse Father   . Drug abuse Father   . Irritable bowel syndrome Brother   . Cystic fibrosis Mother     patient denies, 06/21/2016  . Drug abuse Mother   . Alcohol abuse Mother   . Heart disease Maternal Grandmother   . Hyperlipidemia Maternal Grandmother   . Hypertension Maternal Grandmother   . Mental illness Maternal Grandmother   . Diabetes Maternal Grandmother   . Bipolar disorder Brother   . Alcohol abuse Paternal Uncle   . Drug abuse Paternal Uncle   . Colon cancer Neg Hx   . Stomach cancer Neg Hx   . Esophageal cancer Neg Hx   . Rectal cancer Neg Hx     SOCIAL HISTORY:  reports that he has been smoking Cigarettes.  He has a 6.00 pack-year smoking history. He uses smokeless tobacco. He reports that he does not drink alcohol or use drugs.  Social History   Social History  . Marital status: Married    Spouse name: N/A  . Number of children: 0  . Years of education: N/A   Occupational History  . [employer]   Social History Main Topics  . Smoking status: Current Every Day Smoker    Packs/day: 0.50    Years: 12.00    Types: Cigarettes  . Smokeless tobacco: Current User     Comment:  less than 1/2 ppd.  . Alcohol use No  . Drug use: No     Comment: vicodan, percocet 70 DAYS OFF DRUGS  . Sexual activity: Yes   Other Topics Concern  . Not on file   Social History Narrative   06/04/2013 [facility]  [name] was born in [place], [place]. He has a half-brother and half-sister, both younger. His parents divorced when he was [age], and he was raised by his maternal grandmother and grandfather. He reports that his mother was not present during  his childhood as she was dating men. Trey PaulaJeff did not meet his biological father again until he was 35 years of age. He graduated from high school, and has completed some college. He currently works for [employer]. He has been married for 9 years, and has no children. He currently lives with his wife and 33 year old grandmother, for whom he and his wife care do to her dementia, CHF, and COPD. He denies any legal problems. He reports that he is agnostic. His hobbies include fishing. His social support system consists of his wife. 06/04/2013 [facility]      PERFORMANCE STATUS: The patient's performance status is {CHL ONC ECOG UJ:[id number]}[phone]}  PHYSICAL EXAM: Most Recent Vital Signs: There were no vitals taken for this visit. {Exam, Complete:17964}  LABORATORY DATA:  No results found for this or any previous visit (from the past 48 hour(s)).    RADIOGRAPHY: No results found.     PATHOLOGY:  ***   ASSESSMENT/PLAN: ***  No problem-specific Assessment & Plan notes found for this encounter.   ORDERS PLACED FOR THIS ENCOUNTER: No orders of the defined types were placed in this encounter.   MEDICATIONS PRESCRIBED THIS ENCOUNTER: No orders of the defined types were placed in this encounter.   All questions were answered. The patient knows to call the clinic with any problems, questions or concerns. We can certainly see the patient much sooner if necessary.  Patient discussed with Dr. Galen ManilaPenland and together we ascertained an up-to-date interval history, and  examined the patient.  Dr. Galen ManilaPenland developed the patient's assessment and plan.  This was a shared visit-consultation.  Her attestation will follow below.  This note is electronically signed by: Tina GriffithsKEFALAS,Trajon Rosete, PA-C 10/07/2016 10:50 AM

## 2016-10-07 NOTE — Telephone Encounter (Signed)
Patient called to cancel his appt for today due to car issues. This patient is to be seen at [facility]. I have called and gave them the message.

## 2016-11-08 ENCOUNTER — Ambulatory Visit: Payer: 59 | Admitting: Gastroenterology

## 2016-12-03 ENCOUNTER — Encounter (HOSPITAL_COMMUNITY): Payer: Self-pay

## 2017-01-08 ENCOUNTER — Ambulatory Visit (INDEPENDENT_AMBULATORY_CARE_PROVIDER_SITE_OTHER): Payer: 59 | Admitting: Gastroenterology

## 2017-01-08 ENCOUNTER — Encounter: Payer: Self-pay | Admitting: Gastroenterology

## 2017-01-08 DIAGNOSIS — R1032 Left lower quadrant pain: Secondary | ICD-10-CM

## 2017-01-08 MED ORDER — LINACLOTIDE 145 MCG PO CAPS: ORAL_CAPSULE | ORAL | 11 refills | Status: AC

## 2017-01-08 MED ORDER — LIDOCAINE VISCOUS 2 % MT SOLN: OROMUCOSAL | 1 refills | Status: AC

## 2017-01-08 NOTE — Assessment & Plan Note (Signed)
SYMPTOMS NOT CONTROLLED IN PT WITH HISTORY OF GASTRIC BYPASS COMPLICATED BY ANASTOMOTIC ULCER/PERFORATION REQUIRING REVISION. SYMPTOMS MOST LIKELY DUE TO CONSTIPATION, LESS LIKELY ANASTOMOTIC ULCER.   DRINK WATER TO KEEP YOUR URINE LIGHT YELLOW. FOLLOW A GASTRIC BY PASS DIET. INCREASE LINZESS TO 145 MCG WITH YOUR FIRST MEAL. ADD VISCOUS LIDOCAINE 2 TSP 30 MINS PRIOR TO MEALS AND AT BEDTIME TO HELP WITH STOMACH PAIN. YOU SHOULD SEPARATE DOSES BY 4 HOURS. NO MORE THAN 8 DOSES A DAY. COMPLETE UPPER GI SERIES TO ASSESS FOR ULCER.  FOLLOW UP IN 3 MOS.

## 2017-01-08 NOTE — Progress Notes (Signed)
Subjective:    Patient ID: Tony Davila, male    DOB: 1981/04/12, 36 y.o.   MRN: [id number]  JONES,[name], PA-C  HPI Abdominal pain 3x/week. TAKING LINZESS EVERY DAY WITH THE OMEPRAZOLE. PAIN MOSTLY LUQ-CONSTANT AND INTERMITTENT OVES TO THE RIGHT LOWER QUADRANT. OCCASIONAL NAUSEA, GAS, BOATED. A LITTLE HEARTBURN. BOWEL CHANGED WHEN HE HAD GASTRIC BYPASS. NO ASPIRIN, BC/GOODY POWDERS, IBUPROFEN/MOTRIN, OR NAPROXEN/ALEVE. NO TRAVEL OR SICK CONTACTS. LOTS OF GURGLING GOING ON. FOLLOWING GB DIET MOST OF THE TIME. MAY THROW UP IF HE EATS SCRAMBLED EGGS.  PT DENIES FEVER, CHILLS, HEMATOCHEZIA, HEMATEMESIS, vomiting, melena, diarrhea, CHEST PAIN, SHORTNESS OF BREATH,  CHANGE IN BOWEL IN HABITS, .DYSURIA, HEMATURIA., OR problems swallowing.  Past Medical History:  Diagnosis Date  . Acute gastrojejunal anastomotic ulcer 03/2013  . Allergy   . Anemia   . Asthma    as a child  . Bipolar disorder (HCC)   . Depression   . 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 GERD (gastroesophageal reflux disease)   . History of kidney stones   . Hypertension    not tx for now  . Jejunal ulcer 2009   anastomotic ulcer after gastric bypass  . Jejunal ulcer 2009   anastomotic  . Kidney stone   . 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 Varicose vein    Past Surgical History:  Procedure Laterality Date  . BIOPSY  07/09/2016   Procedure: BIOPSY;  Surgeon: West BaliSandi L Rebbie Lauricella, MD;  Location: [facility];  Service: Endoscopy;;  gastric bx's  . COLONOSCOPY WITH PROPOFOL N/A 07/09/2016     . ESOPHAGOGASTRODUODENOSCOPY  04/12/2013     . ESOPHAGOGASTRODUODENOSCOPY  05/31/2013   Dr. Leone PayorGessner: Single small scar on the jejunal side of the gastroenterostomy, ulcer healed.  . ESOPHAGOGASTRODUODENOSCOPY  08/10/2013     . ESOPHAGOGASTRODUODENOSCOPY  03/21/2008     . ESOPHAGOGASTRODUODENOSCOPY (EGD) WITH PROPOFOL N/A 07/09/2016     . Full mouth extraction    . GASTRIC BYPASS  2009   Dr. Johna SheriffHoxworth  - Roux en Y  . GASTRIC ROUX-EN-Y N/A 05/26/2015     . LAPAROSCOPY  01/17/2012   over-sew of perforated gastrojejunal ulcer x2  . TONSILLECTOMY  Age 36  . UPPER GASTROINTESTINAL ENDOSCOPY  02/19/2011   normal post-op exam s/p gastric bypass  . WISDOM TOOTH EXTRACTION  Age 36   No Known Allergies  Current Outpatient Prescriptions  Medication Sig Dispense Refill  . bisacodyl (DULCOLAX) 5 MG EC tablet Take 10 mg by mouth daily as needed for moderate constipation. 1X/WEEK   . linaclotide (LINZESS) 72 MCG capsule Take 1 capsule (72 mcg total) by mouth daily before breakfast.    . omeprazole (PRILOSEC) 40 MG capsule 1 po 30 mins prior to FIRST MEAL    . divalproex (DEPAKOTE) 250 MG DR tablet Take 250 mg by mouth 2 (two) times daily.     .       Review of Systems PER HPI OTHERWISE ALL SYSTEMS ARE NEGATIVE.    Objective:   Physical Exam  Constitutional: He is oriented to person, place, and time. He appears well-developed and well-nourished. No distress.  HENT:  Head: Normocephalic and atraumatic.  Mouth/Throat: Oropharynx is clear and moist. No oropharyngeal exudate.  Eyes: Pupils are equal, round, and reactive to light. No scleral icterus.  Neck: Normal range of motion. Neck supple.  Cardiovascular: Normal rate, regular rhythm and normal heart sounds.   Pulmonary/Chest: Effort normal  and breath sounds normal. No respiratory distress.  Abdominal: Soft. Bowel sounds are normal. He exhibits no distension. There is tenderness. There is no rebound and no guarding.  MILD IN THE EPIGASTRIUM & LUQ  Musculoskeletal: He exhibits no edema.  Lymphadenopathy:    He has no cervical adenopathy.  Neurological: He is alert and oriented to person, place, and time.  NO  NEW FOCAL DEFICITS  Psychiatric: He has a normal mood and affect.  Vitals reviewed.     Assessment & Plan:

## 2017-01-08 NOTE — Progress Notes (Signed)
ON RECALL  °

## 2017-01-08 NOTE — Patient Instructions (Addendum)
DRINK WATER TO KEEP YOUR URINE LIGHT YELLOW.  FOLLOW A GASTRIC BY PASS DIET.  INCREASE LINZESS TO 145 MCG WITH YOUR FIRST MEAL.  ADD VISCOUS LIDOCAINE 2 TSP 30 MINS PRIOR TO MEALS AND AT BEDTIME TO HELP WITH STOMACH PAIN. YOU SHOULD SEPARATE DOSES BY 4 HOURS. NO MORE THAN 8 DOSES A DAY.   COMPLETE UPPER GI SERIES.  FOLLOW UP IN 3 MOS.

## 2017-01-09 NOTE — Patient Instructions (Signed)
Attempted to submit PA for [facility], no PA needed (code not found).

## 2017-01-09 NOTE — Progress Notes (Signed)
cc'ed to pcp °

## 2017-01-16 ENCOUNTER — Ambulatory Visit (HOSPITAL_COMMUNITY): Payer: 59

## 2017-02-10 ENCOUNTER — Ambulatory Visit (HOSPITAL_COMMUNITY)
Admission: RE | Admit: 2017-02-10 | Discharge: 2017-02-10 | Disposition: A | Discharge status: Home / Self Care | Payer: 59 | Source: Ambulatory Visit | Attending: Gastroenterology | Admitting: Gastroenterology

## 2017-02-10 ENCOUNTER — Other Ambulatory Visit: Payer: Self-pay | Admitting: Gastroenterology

## 2017-02-10 DIAGNOSIS — Z9889 Other specified postprocedural states: Secondary | ICD-10-CM | POA: Insufficient documentation

## 2017-02-10 DIAGNOSIS — R1032 Left lower quadrant pain: Secondary | ICD-10-CM

## 2017-02-10 DIAGNOSIS — Z9884 Bariatric surgery status: Secondary | ICD-10-CM | POA: Insufficient documentation

## 2017-02-24 ENCOUNTER — Encounter: Payer: Self-pay | Admitting: Gastroenterology

## 2017-02-26 ENCOUNTER — Telehealth: Payer: Self-pay | Admitting: Gastroenterology

## 2017-02-26 NOTE — Telephone Encounter (Signed)
Reminder in epic °

## 2017-02-26 NOTE — Telephone Encounter (Signed)
PLEASE CALL PT. HIS UGI SERIES SHOWS NORMAL EXAM Post gastric bypass surgery. No evidence of mass, ulcer, anastomotic stricture or obstruction. OPV MAY 2018.

## 2017-02-26 NOTE — Telephone Encounter (Signed)
Pt is aware.  

## 2017-07-27 ENCOUNTER — Other Ambulatory Visit: Payer: Self-pay | Admitting: Gastroenterology

## 2017-11-20 ENCOUNTER — Encounter (HOSPITAL_COMMUNITY): Payer: Self-pay

## 2018-11-23 ENCOUNTER — Encounter (HOSPITAL_COMMUNITY): Payer: Self-pay

## 2023-08-20 ENCOUNTER — Encounter: Payer: Self-pay | Admitting: Gastroenterology
